# Patient Record
Sex: Female | Born: 1950 | Race: White | Hispanic: No | Marital: Married | Smoking: Never smoker
Health system: Southern US, Community
[De-identification: ages and names within clinical notes are randomized; demographics above are authoritative.]

## PROBLEM LIST (undated history)

## (undated) DIAGNOSIS — I82409 Acute embolism and thrombosis of unspecified deep veins of unspecified lower extremity: Secondary | ICD-10-CM

## (undated) DIAGNOSIS — J189 Pneumonia, unspecified organism: Secondary | ICD-10-CM

## (undated) DIAGNOSIS — Z9889 Other specified postprocedural states: Secondary | ICD-10-CM

## (undated) DIAGNOSIS — N200 Calculus of kidney: Secondary | ICD-10-CM

## (undated) DIAGNOSIS — K219 Gastro-esophageal reflux disease without esophagitis: Secondary | ICD-10-CM

## (undated) DIAGNOSIS — R112 Nausea with vomiting, unspecified: Secondary | ICD-10-CM

## (undated) DIAGNOSIS — Z87442 Personal history of urinary calculi: Secondary | ICD-10-CM

## (undated) DIAGNOSIS — R519 Headache, unspecified: Secondary | ICD-10-CM

## (undated) DIAGNOSIS — L900 Lichen sclerosus et atrophicus: Secondary | ICD-10-CM

## (undated) DIAGNOSIS — R7303 Prediabetes: Secondary | ICD-10-CM

## (undated) DIAGNOSIS — M1990 Unspecified osteoarthritis, unspecified site: Secondary | ICD-10-CM

## (undated) DIAGNOSIS — G2581 Restless legs syndrome: Secondary | ICD-10-CM

## (undated) HISTORY — PX: RETINAL TEAR REPAIR CRYOTHERAPY: SHX5304

## (undated) HISTORY — PX: OTHER SURGICAL HISTORY: SHX169

---
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
## 1982-01-16 HISTORY — PX: TONSILLECTOMY: SUR1361

## 1983-01-17 HISTORY — PX: TUBAL LIGATION: SHX77

## 1994-01-16 HISTORY — PX: CHOLECYSTECTOMY: SHX55

## 2004-01-17 HISTORY — PX: EXTRACORPOREAL SHOCK WAVE LITHOTRIPSY: SHX1557

## 2008-01-17 HISTORY — PX: CYST EXCISION: SHX5701

## 2011-01-17 HISTORY — PX: CATARACT EXTRACTION: SUR2

## 2011-01-17 HISTORY — PX: EYE SURGERY: SHX253

## 2012-07-03 DIAGNOSIS — M1610 Unilateral primary osteoarthritis, unspecified hip: Secondary | ICD-10-CM | POA: Insufficient documentation

## 2013-01-16 DIAGNOSIS — I2699 Other pulmonary embolism without acute cor pulmonale: Secondary | ICD-10-CM

## 2013-01-16 DIAGNOSIS — J189 Pneumonia, unspecified organism: Secondary | ICD-10-CM

## 2013-01-16 DIAGNOSIS — I82409 Acute embolism and thrombosis of unspecified deep veins of unspecified lower extremity: Secondary | ICD-10-CM

## 2013-01-16 HISTORY — DX: Other pulmonary embolism without acute cor pulmonale: I26.99

## 2013-01-16 HISTORY — DX: Pneumonia, unspecified organism: J18.9

## 2013-01-16 HISTORY — PX: HYSTEROSCOPY: SHX211

## 2013-01-16 HISTORY — PX: COLONOSCOPY: SHX174

## 2013-01-16 HISTORY — DX: Acute embolism and thrombosis of unspecified deep veins of unspecified lower extremity: I82.409

## 2013-08-15 DIAGNOSIS — I2699 Other pulmonary embolism without acute cor pulmonale: Secondary | ICD-10-CM | POA: Insufficient documentation

## 2013-08-15 DIAGNOSIS — R0781 Pleurodynia: Secondary | ICD-10-CM | POA: Insufficient documentation

## 2013-08-16 DIAGNOSIS — R0902 Hypoxemia: Secondary | ICD-10-CM | POA: Insufficient documentation

## 2013-08-17 DIAGNOSIS — I82409 Acute embolism and thrombosis of unspecified deep veins of unspecified lower extremity: Secondary | ICD-10-CM | POA: Insufficient documentation

## 2017-07-09 DIAGNOSIS — E669 Obesity, unspecified: Secondary | ICD-10-CM | POA: Insufficient documentation

## 2017-07-09 DIAGNOSIS — Z86711 Personal history of pulmonary embolism: Secondary | ICD-10-CM | POA: Insufficient documentation

## 2017-07-09 DIAGNOSIS — L900 Lichen sclerosus et atrophicus: Secondary | ICD-10-CM | POA: Insufficient documentation

## 2017-07-09 DIAGNOSIS — R519 Headache, unspecified: Secondary | ICD-10-CM | POA: Insufficient documentation

## 2017-08-08 ENCOUNTER — Ambulatory Visit: Payer: Self-pay | Admitting: Family Medicine

## 2017-08-26 DIAGNOSIS — R519 Headache, unspecified: Secondary | ICD-10-CM | POA: Insufficient documentation

## 2017-11-09 DIAGNOSIS — Z0000 Encounter for general adult medical examination without abnormal findings: Secondary | ICD-10-CM | POA: Insufficient documentation

## 2018-03-19 ENCOUNTER — Other Ambulatory Visit: Payer: Self-pay | Admitting: Obstetrics and Gynecology

## 2018-03-19 DIAGNOSIS — Z1231 Encounter for screening mammogram for malignant neoplasm of breast: Secondary | ICD-10-CM

## 2018-04-01 ENCOUNTER — Ambulatory Visit
Admission: RE | Admit: 2018-04-01 | Discharge: 2018-04-01 | Disposition: A | Discharge status: Home / Self Care | Payer: Medicare Other | Source: Ambulatory Visit | Attending: Obstetrics and Gynecology | Admitting: Obstetrics and Gynecology

## 2018-04-01 ENCOUNTER — Other Ambulatory Visit: Payer: Self-pay

## 2018-04-01 DIAGNOSIS — Z1231 Encounter for screening mammogram for malignant neoplasm of breast: Secondary | ICD-10-CM

## 2018-05-08 ENCOUNTER — Ambulatory Visit: Payer: Self-pay | Admitting: Urology

## 2018-06-19 ENCOUNTER — Ambulatory Visit: Payer: Self-pay | Admitting: Urology

## 2018-07-02 ENCOUNTER — Other Ambulatory Visit: Payer: Self-pay

## 2018-07-02 ENCOUNTER — Encounter: Payer: Self-pay | Admitting: Urology

## 2018-07-02 ENCOUNTER — Ambulatory Visit
Admission: RE | Admit: 2018-07-02 | Discharge: 2018-07-02 | Disposition: A | Discharge status: Home / Self Care | Payer: Medicare Other | Source: Ambulatory Visit | Attending: Urology | Admitting: Urology

## 2018-07-02 ENCOUNTER — Ambulatory Visit (INDEPENDENT_AMBULATORY_CARE_PROVIDER_SITE_OTHER): Payer: Medicare Other | Admitting: Urology

## 2018-07-02 VITALS — BP 114/77 | HR 82 | Ht 65.0 in | Wt 229.5 lb

## 2018-07-02 DIAGNOSIS — N9089 Other specified noninflammatory disorders of vulva and perineum: Secondary | ICD-10-CM | POA: Insufficient documentation

## 2018-07-02 DIAGNOSIS — N200 Calculus of kidney: Secondary | ICD-10-CM | POA: Insufficient documentation

## 2018-07-02 DIAGNOSIS — G2581 Restless legs syndrome: Secondary | ICD-10-CM | POA: Insufficient documentation

## 2018-07-02 DIAGNOSIS — N950 Postmenopausal bleeding: Secondary | ICD-10-CM | POA: Insufficient documentation

## 2018-07-02 DIAGNOSIS — H269 Unspecified cataract: Secondary | ICD-10-CM | POA: Insufficient documentation

## 2018-07-02 NOTE — Progress Notes (Addendum)
07/02/2018 2:39 PM   Savannah Wilkins September 16, 1950 [id number]  Referring provider: Boykin Nearing, MD [address] [facility] [place],  [place] [address]  Chief Complaint  Patient presents with  . Establish Care  . Nephrolithiasis    HPI: Savannah Wilkins is a 68 year-old female with a history of recurrent stone disease who presents to establish urologic care.  She has previously been followed by Dr. Dyann Ruddle at [facility] and last saw him 04/04/2017.  She has had multiple prior stone procedures including right ureteroscopic stone removals in 2006, 2013; a left ureteroscopic stone removal 2006 and shockwave lithotripsy x3 and 2006 twice on the left and once on the right.  She states her last imaging was 18 months ago and had left renal calculi.  She passed a small stone in April 2019 and has been asymptomatic since that time.  She denies flank, abdominal or pelvic pain.  She has no voiding symptoms and denies gross hematuria.  PMH: Past Medical History:  Diagnosis Date  . Kidney stone     Surgical History: . CATARACT EXTRACTION  . CHOLECYSTECTOMY  . COMBINED HYSTEROSCOPY DIAGNOSTIC / D&C 02/10/2013  . DCR of right eye  . Left eye retinal tear repair  . Multiple kidney stone surgeries  . Right eye retinal tear repair  . Right pinkie finger cyst excision 01/23/2008  . TONSILLECTOMY 1984  . TUBAL LIGATION 1985   Home Medications:  Allergies as of 07/02/2018      Reactions   Ciprofloxacin    Quinolones    Other reaction(s): Other (See Comments), Other (See Comments), Other (See Comments) Caused pain & cramping Caused severe tendonitis in feet Caused severe tendonitis in feet   Penicillins Rash   Caused generalized rash   Sulfa Antibiotics Rash   Caused generalized rash      Medication List       Accurate as of July 02, 2018  2:39 PM. If you have any questions, ask your nurse or doctor.        amitriptyline 25 MG tablet Commonly known as:  ELAVIL amitriptyline 25 mg tablet  TK 1 T PO HS   clobetasol ointment 0.05 % Commonly known as: TEMOVATE clobetasol 0.05 % topical ointment  APP THIN LAYER EXT AA 2 TIMES A WK HS   Emgality 120 MG/ML Soaj Generic drug: Galcanezumab-gnlm   Fluocinolone Acetonide 0.01 % Oil INT 4 GTS IN AU QHS PRN FOR DRYNESS AND ITCHING   ketorolac 10 MG tablet Commonly known as: TORADOL ketorolac 10 mg tablet   meloxicam 15 MG tablet Commonly known as: MOBIC meloxicam 15 mg tablet   neomycin-polymyxin-hydrocortisone OTIC solution Commonly known as: CORTISPORIN   ofloxacin 400 MG tablet Commonly known as: FLOXIN ofloxacin 400 mg tablet   Phendimetrazine Tartrate 105 MG Cp24 phendimetrazine tartrate ER 105 mg capsule,extended release   Prevnar 13 Susp injection Generic drug: pneumococcal 13-valent conjugate vaccine Prevnar 13 (PF) 0.5 mL intramuscular syringe  ADM 0.5ML IM UTD   ProAir HFA 108 (90 Base) MCG/ACT inhaler Generic drug: albuterol ProAir HFA 90 mcg/actuation aerosol inhaler   rOPINIRole 1 MG tablet Commonly known as: REQUIP Take by mouth.   tamsulosin 0.4 MG Caps capsule Commonly known as: FLOMAX tamsulosin 0.4 mg capsule   venlafaxine 37.5 MG tablet Commonly known as: EFFEXOR   Xarelto 20 MG Tabs tablet Generic drug: rivaroxaban Xarelto 20 mg tablet       Allergies:  Allergies  Allergen Reactions  . Ciprofloxacin   .  Quinolones     Other reaction(s): Other (See Comments), Other (See Comments), Other (See Comments) Caused pain & cramping Caused severe tendonitis in feet Caused severe tendonitis in feet   . Penicillins Rash    Caused generalized rash   . Sulfa Antibiotics Rash    Caused generalized rash     Family History: No family history on file.  Social History:  reports that she has never smoked. She has never used smokeless tobacco. She reports previous drug use. No history on file for alcohol.  ROS: UROLOGY Frequent Urination?: No  Hard to postpone urination?: No Burning/pain with urination?: No Get up at night to urinate?: No Leakage of urine?: No Urine stream starts and stops?: No Trouble starting stream?: No Do you have to strain to urinate?: No Blood in urine?: No Urinary tract infection?: No Sexually transmitted disease?: No Injury to kidneys or bladder?: No Painful intercourse?: No Weak stream?: No Currently pregnant?: No Vaginal bleeding?: No Last menstrual period?: Postmenopausal  Gastrointestinal Nausea?: No Vomiting?: No Indigestion/heartburn?: No Diarrhea?: No Constipation?: No  Constitutional Fever: No Night sweats?: No Weight loss?: No Fatigue?: No  Skin Skin rash/lesions?: No Itching?: No  Eyes Blurred vision?: No Double vision?: No  Ears/Nose/Throat Sore throat?: No Sinus problems?: No  Hematologic/Lymphatic Swollen glands?: No Easy bruising?: No  Cardiovascular Leg swelling?: No Chest pain?: No  Respiratory Cough?: No Shortness of breath?: No  Endocrine Excessive thirst?: No  Musculoskeletal Back pain?: No Joint pain?: No  Neurological Headaches?: Yes Dizziness?: No  Psychologic Depression?: No Anxiety?: No  Physical Exam: BP 114/77 (BP Location: Left Arm, Patient Position: Sitting, Cuff Size: Large)   Pulse 82   Ht 5\' 5"  (1.651 m)   Wt 229 lb 8 oz (104.1 kg)   BMI 38.19 kg/m   Constitutional:  Alert and oriented, No acute distress. HEENT: [place] AT, moist mucus membranes.  Trachea midline, no masses. Cardiovascular: No clubbing, cyanosis, or edema. RRR Respiratory: Normal respiratory effort, no increased work of breathing. Clear Neurologic: Grossly intact, no focal deficits, moving all 4 extremities. Psychiatric: Normal mood and affect.   Assessment & Plan:   68 year old female with recurrent stone disease and left nephrolithiasis by history.  A KUB was ordered and she will be notified with results.  As long as she remains asymptomatic she will  follow-up annually but was instructed to call as needed for development of flank pain/renal colic.   Return in about 1 year (around 07/02/2019) for [place], KUB.   Addendum:  KUB obtained on 6/16 showed a 12 mm calcification overlying the expected course of the right proximal ureter.  Stone protocol CT performed on 07/17/2018 was remarkable for a 7 mm calcification in the right proximal ureter with no significant obstruction.  She was also noted to have bilateral, small renal calculi.  She was contacted by telephone regarding her CT results.  Management options were discussed including trial of passage, shockwave lithotripsy and ureteroscopy.  She has elected to proceed with shockwave lithotripsy.  Stone density ~ 880 HU.  The procedure was discussed in detail on 07/18/2018.  Potential risks were discussed as outlined.  [facility] consent form.  The possible need for stent placement or repeat lithotripsy/additional procedures was discussed.  She is on Xarelto which will need to be discontinued per [employer].  All questions were answered and she desires to proceed.   Abbie Sons, [place] [address] [place], [place] [address] [phone]  

## 2018-07-02 NOTE — Addendum Note (Signed)
Addended by: Donalee Citrin on: 07/02/2018 03:45 PM   Modules accepted: Orders

## 2018-07-03 ENCOUNTER — Telehealth: Payer: Self-pay | Admitting: Urology

## 2018-07-03 DIAGNOSIS — N200 Calculus of kidney: Secondary | ICD-10-CM

## 2018-07-03 DIAGNOSIS — N201 Calculus of ureter: Secondary | ICD-10-CM

## 2018-07-03 LAB — MICROSCOPIC EXAMINATION
Bacteria, UA: NONE SEEN
RBC, Urine: NONE SEEN /hpf (ref 0–2)

## 2018-07-03 LAB — URINALYSIS, COMPLETE
Bilirubin, UA: NEGATIVE
Glucose, UA: NEGATIVE
Leukocytes,UA: NEGATIVE
Nitrite, UA: NEGATIVE
Protein,UA: NEGATIVE
Specific Gravity, UA: 1.025 (ref 1.005–1.030)
Urobilinogen, Ur: 0.2 mg/dL (ref 0.2–1.0)
pH, UA: 5.5 (ref 5.0–7.5)

## 2018-07-03 NOTE — Telephone Encounter (Signed)
She has a fairly large calcification which could be a stone in the right upper ureter.  There is a large amount of stool and bowel gas obscuring the renal outlines.  There may be a calculus in the upper part of the left kidney.  For further evaluation I would recommend scheduling a stone protocol CT of the abdomen and pelvis.  Order was entered and will call with results.

## 2018-07-03 NOTE — Telephone Encounter (Signed)
Patient called the office this afternoon requesting results from her KUB.  Please call her at [phone].

## 2018-07-04 NOTE — Telephone Encounter (Signed)
Patient notified

## 2018-07-17 ENCOUNTER — Other Ambulatory Visit: Payer: Self-pay

## 2018-07-17 ENCOUNTER — Ambulatory Visit
Admission: RE | Admit: 2018-07-17 | Discharge: 2018-07-17 | Disposition: A | Discharge status: Home / Self Care | Payer: Medicare Other | Source: Ambulatory Visit | Attending: Urology | Admitting: Urology

## 2018-07-17 DIAGNOSIS — N200 Calculus of kidney: Secondary | ICD-10-CM

## 2018-07-17 DIAGNOSIS — N201 Calculus of ureter: Secondary | ICD-10-CM | POA: Insufficient documentation

## 2018-07-18 ENCOUNTER — Telehealth: Payer: Self-pay | Admitting: Urology

## 2018-07-18 NOTE — Telephone Encounter (Signed)
Patient was notified of CT results.  She has a 7 mm right proximal ureteral calculus.  Treatment options were discussed including shockwave lithotripsy and ureteroscopy.  She has elected shockwave lithotripsy.  We discussed the pros and cons of each treatment and the success rate of lithotripsy at >80%.  All questions were answered and she desires to schedule.

## 2018-07-22 ENCOUNTER — Other Ambulatory Visit: Payer: Self-pay

## 2018-07-22 ENCOUNTER — Other Ambulatory Visit: Payer: Medicare Other

## 2018-07-22 ENCOUNTER — Encounter: Payer: Self-pay | Admitting: *Deleted

## 2018-07-22 ENCOUNTER — Other Ambulatory Visit: Payer: Self-pay | Admitting: Radiology

## 2018-07-22 DIAGNOSIS — N200 Calculus of kidney: Secondary | ICD-10-CM

## 2018-07-22 DIAGNOSIS — N201 Calculus of ureter: Secondary | ICD-10-CM

## 2018-07-22 LAB — URINALYSIS, COMPLETE
Bilirubin, UA: NEGATIVE
Glucose, UA: NEGATIVE
Nitrite, UA: NEGATIVE
Specific Gravity, UA: 1.025 (ref 1.005–1.030)
Urobilinogen, Ur: 1 mg/dL (ref 0.2–1.0)
pH, UA: 5.5 (ref 5.0–7.5)

## 2018-07-22 LAB — MICROSCOPIC EXAMINATION

## 2018-07-24 LAB — CULTURE, URINE COMPREHENSIVE

## 2018-07-24 MED ORDER — SODIUM CHLORIDE 0.9 % IV SOLN
1.0000 g | Freq: Once | INTRAVENOUS | Status: AC
  Administered 2018-07-25: 1 g via INTRAVENOUS
  Filled 2018-07-24: qty 10

## 2018-07-25 ENCOUNTER — Telehealth: Payer: Self-pay | Admitting: Urology

## 2018-07-25 ENCOUNTER — Encounter
Admission: RE | Disposition: A | Discharge status: Home / Self Care | Payer: Self-pay | Source: Home / Self Care | Attending: Urology

## 2018-07-25 ENCOUNTER — Ambulatory Visit
Admission: RE | Admit: 2018-07-25 | Discharge: 2018-07-25 | Disposition: A | Discharge status: Home / Self Care | Payer: Medicare Other | Attending: Urology | Admitting: Urology

## 2018-07-25 ENCOUNTER — Ambulatory Visit: Payer: Medicare Other

## 2018-07-25 ENCOUNTER — Other Ambulatory Visit: Payer: Self-pay

## 2018-07-25 ENCOUNTER — Encounter: Payer: Self-pay | Admitting: *Deleted

## 2018-07-25 ENCOUNTER — Other Ambulatory Visit: Payer: Self-pay | Admitting: Urology

## 2018-07-25 DIAGNOSIS — Z7901 Long term (current) use of anticoagulants: Secondary | ICD-10-CM | POA: Diagnosis not present

## 2018-07-25 DIAGNOSIS — Z79899 Other long term (current) drug therapy: Secondary | ICD-10-CM | POA: Diagnosis not present

## 2018-07-25 DIAGNOSIS — Z87442 Personal history of urinary calculi: Secondary | ICD-10-CM | POA: Diagnosis not present

## 2018-07-25 DIAGNOSIS — Z791 Long term (current) use of non-steroidal anti-inflammatories (NSAID): Secondary | ICD-10-CM | POA: Insufficient documentation

## 2018-07-25 DIAGNOSIS — N201 Calculus of ureter: Secondary | ICD-10-CM | POA: Insufficient documentation

## 2018-07-25 HISTORY — DX: Unspecified osteoarthritis, unspecified site: M19.90

## 2018-07-25 HISTORY — DX: Personal history of urinary calculi: Z87.442

## 2018-07-25 HISTORY — DX: Lichen sclerosus et atrophicus: L90.0

## 2018-07-25 HISTORY — PX: EXTRACORPOREAL SHOCK WAVE LITHOTRIPSY: SHX1557

## 2018-07-25 HISTORY — DX: Headache, unspecified: R51.9

## 2018-07-25 SURGERY — LITHOTRIPSY, ESWL
Anesthesia: Moderate Sedation | Laterality: Right

## 2018-07-25 MED ORDER — DIAZEPAM 5 MG PO TABS
10.0000 mg | ORAL_TABLET | ORAL | Status: AC
  Administered 2018-07-25: 07:00:00 10 mg via ORAL

## 2018-07-25 MED ORDER — DIPHENHYDRAMINE HCL 25 MG PO CAPS
25.0000 mg | ORAL_CAPSULE | ORAL | Status: AC
  Administered 2018-07-25: 07:00:00 25 mg via ORAL

## 2018-07-25 MED ORDER — DIPHENHYDRAMINE HCL 25 MG PO CAPS
ORAL_CAPSULE | ORAL | Status: AC
  Administered 2018-07-25: 25 mg via ORAL
  Filled 2018-07-25: qty 1

## 2018-07-25 MED ORDER — ONDANSETRON HCL 4 MG/2ML IJ SOLN
4.0000 mg | Freq: Once | INTRAMUSCULAR | Status: AC | PRN
  Administered 2018-07-25: 07:00:00 4 mg via INTRAVENOUS

## 2018-07-25 MED ORDER — TAMSULOSIN HCL 0.4 MG PO CAPS: 0.4000 mg | ORAL_CAPSULE | Freq: Every day | ORAL | 0 refills | Status: DC

## 2018-07-25 MED ORDER — HYDROCODONE-ACETAMINOPHEN 5-325 MG PO TABS: 1.0000 | ORAL_TABLET | ORAL | 0 refills | Status: AC | PRN

## 2018-07-25 MED ORDER — SODIUM CHLORIDE 0.9 % IV SOLN
INTRAVENOUS | Status: DC
  Administered 2018-07-25: 07:00:00 via INTRAVENOUS

## 2018-07-25 MED ORDER — ONDANSETRON HCL 2 MG/ML IV SOLN: 4.0000 mg | Freq: Once | INTRAVENOUS | Status: DC | PRN

## 2018-07-25 MED ORDER — DIAZEPAM 5 MG PO TABS
ORAL_TABLET | ORAL | Status: AC
  Administered 2018-07-25: 07:00:00 10 mg via ORAL
  Filled 2018-07-25: qty 2

## 2018-07-25 MED ORDER — ONDANSETRON HCL 4 MG/2ML IJ SOLN
INTRAMUSCULAR | Status: AC
  Administered 2018-07-25: 4 mg via INTRAVENOUS
  Filled 2018-07-25: qty 2

## 2018-07-25 NOTE — Discharge Instructions (Signed)

## 2018-07-25 NOTE — H&P (Signed)
UROLOGY H&P UPDATE  Agree with prior H&P dated 07/02/18. 68 yo F with 48mm right mid ureteral stone.  Cardiac: RRR Lungs: CTA bilaterally  Laterality: RIGHT Procedure: SWL  Urine: culture 7/6 10k mixed urogenital flora  Informed consent obtained, we specifically discussed the risks of bleeding, infection, post-operative pain, need for additional procedures, steinstrasse.  Billey Co, MD 07/25/2018

## 2018-07-25 NOTE — Telephone Encounter (Signed)
RX sent in

## 2018-07-25 NOTE — Telephone Encounter (Signed)
Pt just got out of surgery and called and states that her Flomax has not been called in.

## 2018-07-26 ENCOUNTER — Encounter: Payer: Self-pay | Admitting: Urology

## 2018-07-26 ENCOUNTER — Telehealth: Payer: Self-pay | Admitting: Urology

## 2018-07-26 DIAGNOSIS — R112 Nausea with vomiting, unspecified: Secondary | ICD-10-CM

## 2018-07-26 DIAGNOSIS — Z9889 Other specified postprocedural states: Secondary | ICD-10-CM

## 2018-07-26 MED ORDER — ONDANSETRON HCL 4 MG PO TABS: 4.0000 mg | ORAL_TABLET | Freq: Three times a day (TID) | ORAL | 0 refills | Status: DC | PRN

## 2018-07-26 NOTE — Telephone Encounter (Signed)
Called pt informed her that her symptoms are normal for post lithotripsy. Advised pt to take pain meds as directed, pt states that she has tried to take her pain medication but has been unable to keep anything down. Advised pt that I would send in short term supply of zofran to help with n/v. Advised pt to [employer], as well as after hours nurse. Pt gave verbal understanding.

## 2018-07-26 NOTE — Telephone Encounter (Signed)
Pt called and state that she is having back pain, nausea today and a lot of pressure and also states that the urine is just trickling when she tries to urinate. Please advise.

## 2018-07-26 NOTE — Addendum Note (Signed)
Addended by: Donalee Citrin on: 07/26/2018 02:44 PM   Modules accepted: Orders

## 2018-08-14 ENCOUNTER — Telehealth: Payer: Self-pay | Admitting: Urology

## 2018-08-14 DIAGNOSIS — N200 Calculus of kidney: Secondary | ICD-10-CM

## 2018-08-14 NOTE — Telephone Encounter (Signed)
Patient has an app with [facility] tomorrow and needs an order for a KUB   Thanks  Sharyn Lull

## 2018-08-14 NOTE — Telephone Encounter (Signed)
Orders placed.

## 2018-08-15 ENCOUNTER — Ambulatory Visit (INDEPENDENT_AMBULATORY_CARE_PROVIDER_SITE_OTHER): Payer: Medicare Other | Admitting: Urology

## 2018-08-15 ENCOUNTER — Encounter: Payer: Self-pay | Admitting: Urology

## 2018-08-15 ENCOUNTER — Ambulatory Visit
Admission: RE | Admit: 2018-08-15 | Discharge: 2018-08-15 | Disposition: A | Discharge status: Home / Self Care | Payer: Medicare Other | Source: Ambulatory Visit | Attending: Urology | Admitting: Urology

## 2018-08-15 ENCOUNTER — Other Ambulatory Visit: Payer: Self-pay

## 2018-08-15 VITALS — BP 116/81 | HR 92 | Ht 65.0 in | Wt 227.0 lb

## 2018-08-15 DIAGNOSIS — N200 Calculus of kidney: Secondary | ICD-10-CM

## 2018-08-15 DIAGNOSIS — N2889 Other specified disorders of kidney and ureter: Secondary | ICD-10-CM | POA: Diagnosis not present

## 2018-08-15 NOTE — Progress Notes (Signed)
08/15/2018 12:52 PM   Savannah Wilkins [birth year] [id number]  Referring provider: Dion Body, MD [place] [facility] [place],  [place] [address]  Chief Complaint  Patient presents with  . Nephrolithiasis    HPI: 67 y.o. female presents for post lithotripsy follow-up.  She was seen last month to establish care for recurrent stone disease.  Although asymptomatic she was subsequently found to have a 7 mm right proximal ureteral calculus and underwent shockwave lithotripsy by Dr. Diamantina Providence on 07/25/2018.  She had no post procedure problems and brings in several small fragments today.  Prior stone analysis has been calcium oxalate.  She was incidentally noted to have a 71mm left lower pole renal mass for which renal mass protocol MRI was recommended by radiology.  She also has a 3-4 mm left upper pole renal calculus.  PMH: Past Medical History:  Diagnosis Date  . Arthritis   . DVT (deep venous thrombosis) ([place]) 2015   pulmonary emboli  . Headache   . History of kidney stones   . Kidney stone   . Lichen sclerosus   . Pneumonia 2015    Surgical History: Past Surgical History:  Procedure Laterality Date  . CHOLECYSTECTOMY  1996  . CYST EXCISION Right 2010   pinkie finger  . dcr Right    right eye  . EXTRACORPOREAL SHOCK WAVE LITHOTRIPSY Bilateral 2006   twice on left and once on right  . EXTRACORPOREAL SHOCK WAVE LITHOTRIPSY Right 07/25/2018   Procedure: EXTRACORPOREAL SHOCK WAVE LITHOTRIPSY (ESWL);  Surgeon: Billey Co, MD;  Location: [facility] ORS;  Service: Urology;  Laterality: Right;  . EYE SURGERY Bilateral 2013   cataract extractions  . HYSTEROSCOPY  2015  . kidney stone removal Right 2006, 2013   right ureteroscopic stone removals  . RETINAL TEAR REPAIR CRYOTHERAPY Bilateral   . TONSILLECTOMY  1984  . TUBAL LIGATION  1985    Home Medications:  Allergies as of 08/15/2018      Reactions   Ciprofloxacin    tendonitis   Penicillins Rash   Caused generalized rash   Quinolones    Other reaction(s): Other (See Comments), Other (See Comments), Other (See Comments) Caused pain & cramping Caused severe tendonitis in feet Caused severe tendonitis in feet   Sulfa Antibiotics Rash   Caused generalized rash      Medication List       Accurate as of August 15, 2018 12:52 PM. If you have any questions, ask your nurse or doctor.        amitriptyline 25 MG tablet Commonly known as: ELAVIL amitriptyline 25 mg tablet  TK 1 T PO HS   aspirin EC 81 MG tablet Take 81 mg by mouth daily.   clobetasol ointment 0.05 % Commonly known as: TEMOVATE clobetasol 0.05 % topical ointment  APP THIN LAYER EXT AA 2 TIMES A WK HS   Emgality 120 MG/ML Soaj Generic drug: Galcanezumab-gnlm   Fluocinolone Acetonide 0.01 % Oil INT 4 GTS IN AU QHS PRN FOR DRYNESS AND ITCHING   ketorolac 10 MG tablet Commonly known as: TORADOL ketorolac 10 mg tablet   meloxicam 15 MG tablet Commonly known as: MOBIC meloxicam 15 mg tablet   neomycin-polymyxin-hydrocortisone OTIC solution Commonly known as: CORTISPORIN   ofloxacin 400 MG tablet Commonly known as: FLOXIN ofloxacin 400 mg tablet   ondansetron 4 MG tablet Commonly known as: Zofran Take 1 tablet (4 mg total) by mouth every 8 (eight) hours as needed for nausea or  vomiting.   Phendimetrazine Tartrate 105 MG Cp24 phendimetrazine tartrate ER 105 mg capsule,extended release   PRESERVISION AREDS 2 PO Take by mouth 2 (two) times daily.   Prevnar 13 Susp injection Generic drug: pneumococcal 13-valent conjugate vaccine Prevnar 13 (PF) 0.5 mL intramuscular syringe  ADM 0.5ML IM UTD   ProAir HFA 108 (90 Base) MCG/ACT inhaler Generic drug: albuterol ProAir HFA 90 mcg/actuation aerosol inhaler   rOPINIRole 1 MG tablet Commonly known as: REQUIP Take by mouth.   tamsulosin 0.4 MG Caps capsule Commonly known as: FLOMAX tamsulosin 0.4 mg capsule   tamsulosin 0.4 MG Caps capsule Commonly  known as: FLOMAX Take 1 capsule (0.4 mg total) by mouth daily.   venlafaxine 37.5 MG tablet Commonly known as: EFFEXOR   Xarelto 20 MG Tabs tablet Generic drug: rivaroxaban Xarelto 20 mg tablet       Allergies:  Allergies  Allergen Reactions  . Ciprofloxacin     tendonitis  . Penicillins Rash    Caused generalized rash   . Quinolones     Other reaction(s): Other (See Comments), Other (See Comments), Other (See Comments) Caused pain & cramping Caused severe tendonitis in feet Caused severe tendonitis in feet   . Sulfa Antibiotics Rash    Caused generalized rash     Family History: No family history on file.  Social History:  reports that she has never smoked. She has never used smokeless tobacco. She reports previous alcohol use. She reports previous drug use.  ROS: UROLOGY Frequent Urination?: No Hard to postpone urination?: No Burning/pain with urination?: No Get up at night to urinate?: No Leakage of urine?: No Urine stream starts and stops?: No Trouble starting stream?: No Do you have to strain to urinate?: No Blood in urine?: No Urinary tract infection?: No Sexually transmitted disease?: No Injury to kidneys or bladder?: No Painful intercourse?: No Weak stream?: No Currently pregnant?: No Vaginal bleeding?: No Last menstrual period?: n  Gastrointestinal Nausea?: No Indigestion/heartburn?: No Diarrhea?: No Constipation?: No  Constitutional Fever: No Night sweats?: No Weight loss?: No Fatigue?: No  Skin Skin rash/lesions?: No Itching?: No  Eyes Blurred vision?: No Double vision?: No  Ears/Nose/Throat Sore throat?: No Sinus problems?: No  Hematologic/Lymphatic Swollen glands?: No Easy bruising?: No  Cardiovascular Leg swelling?: No Chest pain?: No  Respiratory Cough?: No Shortness of breath?: No  Endocrine Excessive thirst?: No  Musculoskeletal Back pain?: No Joint pain?: No  Neurological Headaches?: Yes  Dizziness?: No  Psychologic Depression?: No Anxiety?: No  Physical Exam: BP 116/81 (BP Location: Left Arm, Patient Position: Sitting)   Pulse 92   Ht 5\' 5"  (1.651 m)   Wt 227 lb (103 kg)   BMI 37.77 kg/m   Constitutional:  Alert and oriented, No acute distress.   Assessment & Plan:    -Status post shockwave lithotripsy Doing well with complete stone fragmentation.  No residual fragments seen on KUB.  Her renal MRI will assess for silent obstruction.  - Nephrolithiasis  3 mm left renal calculus.  Follow-up KUB 6 months  - Renal mass 69mm left renal mass seen on noncontrast CT.  Schedule renal mass protocol MRI for further evaluation.  This is a new problem unrelated to her stone treatment  Abbie Sons, MD  [place] [address] [place], [place] [address] [phone]

## 2018-08-16 ENCOUNTER — Ambulatory Visit: Payer: Medicare Other | Admitting: Urology

## 2018-08-25 ENCOUNTER — Ambulatory Visit
Admission: RE | Admit: 2018-08-25 | Discharge: 2018-08-25 | Disposition: A | Discharge status: Home / Self Care | Payer: Medicare Other | Source: Ambulatory Visit | Attending: Urology | Admitting: Urology

## 2018-08-25 DIAGNOSIS — N2889 Other specified disorders of kidney and ureter: Secondary | ICD-10-CM

## 2018-08-25 LAB — POCT I-STAT CREATININE: Creatinine, Ser: 0.6 mg/dL (ref 0.44–1.00)

## 2018-08-25 MED ORDER — GADOBUTROL 1 MMOL/ML IV SOLN
10.0000 mL | Freq: Once | INTRAVENOUS | Status: AC | PRN
  Administered 2018-08-25: 10 mL via INTRAVENOUS

## 2018-08-27 ENCOUNTER — Telehealth: Payer: Self-pay | Admitting: Urology

## 2018-08-27 NOTE — Telephone Encounter (Signed)
Pt was told we would call with MRI results.  She hasn't heard anything yet and is very anxious.

## 2018-08-27 NOTE — Telephone Encounter (Signed)
Ok to give results?

## 2018-08-29 ENCOUNTER — Encounter: Payer: Self-pay | Admitting: Urology

## 2018-08-29 NOTE — Telephone Encounter (Signed)
MRI showed a simple cyst.  I sent patient a my chart message

## 2018-08-29 NOTE — Telephone Encounter (Signed)
Pt has called back and is wanting results from her MRI

## 2019-02-11 ENCOUNTER — Other Ambulatory Visit: Payer: Self-pay | Admitting: *Deleted

## 2019-02-11 DIAGNOSIS — N200 Calculus of kidney: Secondary | ICD-10-CM

## 2019-02-20 ENCOUNTER — Ambulatory Visit
Admission: RE | Admit: 2019-02-20 | Discharge: 2019-02-20 | Disposition: A | Discharge status: Home / Self Care | Payer: Medicare PPO | Source: Ambulatory Visit | Attending: Urology | Admitting: Urology

## 2019-02-20 ENCOUNTER — Other Ambulatory Visit: Payer: Self-pay

## 2019-02-20 ENCOUNTER — Encounter: Payer: Self-pay | Admitting: Urology

## 2019-02-20 ENCOUNTER — Ambulatory Visit (INDEPENDENT_AMBULATORY_CARE_PROVIDER_SITE_OTHER): Payer: Medicare PPO | Admitting: Urology

## 2019-02-20 VITALS — BP 142/70 | HR 84 | Ht 65.0 in | Wt 235.0 lb

## 2019-02-20 DIAGNOSIS — N200 Calculus of kidney: Secondary | ICD-10-CM | POA: Insufficient documentation

## 2019-02-20 NOTE — Progress Notes (Signed)
02/20/2019 10:55 AM   Savannah Wilkins [birth year] [id number]  Referring provider: Dion Body, MD [place] [facility] [place],  [place] [address]  Chief Complaint  Patient presents with  . Nephrolithiasis    Urologic history: 1.  Recurrent stone disease -Asymptomatic left renal calculus -Shockwave lithotripsy 7 mm right proximal ureteral calculus 7/20  HPI: 69 yo female presents for semiannual follow-up.  Her stone protocol CT did show a 16 mm indeterminate left renal mass.  She had a renal mass protocol MRI and this was a simple cyst.  She remains asymptomatic since her last visit and specifically denies flank, abdominal or pelvic pain.  Denies bothersome lower urinary tract symptoms.  KUB performed today was reviewed and there has been some interval growth in the left renal calculus measured today at 4.7 mm.  There is also a 2 mm spherical calcification infero-medial to the this calculus.   PMH: Past Medical History:  Diagnosis Date  . Arthritis   . DVT (deep venous thrombosis) ([place]) 2015   pulmonary emboli  . Headache   . History of kidney stones   . Kidney stone   . Lichen sclerosus   . Pneumonia 2015    Surgical History: Past Surgical History:  Procedure Laterality Date  . CHOLECYSTECTOMY  1996  . CYST EXCISION Right 2010   pinkie finger  . dcr Right    right eye  . EXTRACORPOREAL SHOCK WAVE LITHOTRIPSY Bilateral 2006   twice on left and once on right  . EXTRACORPOREAL SHOCK WAVE LITHOTRIPSY Right 07/25/2018   Procedure: EXTRACORPOREAL SHOCK WAVE LITHOTRIPSY (ESWL);  Surgeon: Billey Co, MD;  Location: [facility] ORS;  Service: Urology;  Laterality: Right;  . EYE SURGERY Bilateral 2013   cataract extractions  . HYSTEROSCOPY  2015  . kidney stone removal Right 2006, 2013   right ureteroscopic stone removals  . RETINAL TEAR REPAIR CRYOTHERAPY Bilateral   . TONSILLECTOMY  1984  . TUBAL LIGATION  1985    Home Medications:    Allergies as of 02/20/2019      Reactions   Ciprofloxacin    tendonitis   Penicillins Rash   Caused generalized rash   Quinolones    Other reaction(s): Other (See Comments), Other (See Comments), Other (See Comments) Caused pain & cramping Caused severe tendonitis in feet Caused severe tendonitis in feet   Sulfa Antibiotics Rash   Caused generalized rash      Medication List       Accurate as of February 20, 2019 10:55 AM. If you have any questions, ask your nurse or doctor.        STOP taking these medications   amitriptyline 25 MG tablet Commonly known as: ELAVIL Stopped by: Abbie Sons, MD   ketorolac 10 MG tablet Commonly known as: TORADOL Stopped by: Abbie Sons, MD   meloxicam 15 MG tablet Commonly known as: MOBIC Stopped by: Abbie Sons, MD   neomycin-polymyxin-hydrocortisone OTIC solution Commonly known as: CORTISPORIN Stopped by: Abbie Sons, MD   ofloxacin 400 MG tablet Commonly known as: FLOXIN Stopped by: Abbie Sons, MD   ondansetron 4 MG tablet Commonly known as: Zofran Stopped by: Abbie Sons, MD   Phendimetrazine Tartrate 105 MG Cp24 Stopped by: Abbie Sons, MD   ProAir HFA 108 (90 Base) MCG/ACT inhaler Generic drug: albuterol Stopped by: Abbie Sons, MD   rOPINIRole 1 MG tablet Commonly known as: REQUIP Stopped by: Abbie Sons, MD  venlafaxine 37.5 MG tablet Commonly known as: EFFEXOR Stopped by: Abbie Sons, MD   Xarelto 20 MG Tabs tablet Generic drug: rivaroxaban Stopped by: Abbie Sons, MD     TAKE these medications   aspirin EC 81 MG tablet Take 81 mg by mouth daily.   clobetasol ointment 0.05 % Commonly known as: TEMOVATE clobetasol 0.05 % topical ointment  APP THIN LAYER EXT AA 2 TIMES A WK HS   Emgality 120 MG/ML Soaj Generic drug: Galcanezumab-gnlm   Fluocinolone Acetonide 0.01 % Oil INT 4 GTS IN AU QHS PRN FOR DRYNESS AND ITCHING   PRESERVISION AREDS 2 PO Take by  mouth 2 (two) times daily.   Prevnar 13 Susp injection Generic drug: pneumococcal 13-valent conjugate vaccine Prevnar 13 (PF) 0.5 mL intramuscular syringe  ADM 0.5ML IM UTD   tamsulosin 0.4 MG Caps capsule Commonly known as: FLOMAX tamsulosin 0.4 mg capsule What changed: Another medication with the same name was removed. Continue taking this medication, and follow the directions you see here. Changed by: Abbie Sons, MD       Allergies:  Allergies  Allergen Reactions  . Ciprofloxacin     tendonitis  . Penicillins Rash    Caused generalized rash   . Quinolones     Other reaction(s): Other (See Comments), Other (See Comments), Other (See Comments) Caused pain & cramping Caused severe tendonitis in feet Caused severe tendonitis in feet   . Sulfa Antibiotics Rash    Caused generalized rash     Family History: No family history on file.  Social History:  reports that she has never smoked. She has never used smokeless tobacco. She reports previous alcohol use. She reports previous drug use.  ROS: UROLOGY Frequent Urination?: No Hard to postpone urination?: No Burning/pain with urination?: No Get up at night to urinate?: No Leakage of urine?: No Urine stream starts and stops?: No Trouble starting stream?: No Do you have to strain to urinate?: No Blood in urine?: No Urinary tract infection?: No Sexually transmitted disease?: No Injury to kidneys or bladder?: No Painful intercourse?: No Weak stream?: No Currently pregnant?: No Vaginal bleeding?: No Last menstrual period?: n  Gastrointestinal Nausea?: No Vomiting?: No Indigestion/heartburn?: No Diarrhea?: No Constipation?: No  Constitutional Fever: No Night sweats?: No Weight loss?: No Fatigue?: No  Skin Skin rash/lesions?: No Itching?: No  Eyes Blurred vision?: No Double vision?: No  Ears/Nose/Throat Sore throat?: No Sinus problems?: No  Hematologic/Lymphatic Swollen glands?: No Easy  bruising?: No  Cardiovascular Leg swelling?: No Chest pain?: No  Respiratory Cough?: No Shortness of breath?: No  Endocrine Excessive thirst?: No  Musculoskeletal Back pain?: No Joint pain?: No  Neurological Headaches?: No Dizziness?: No  Psychologic Depression?: No Anxiety?: No  Physical Exam: BP (!) 142/70   Pulse 84   Ht 5\' 5"  (1.651 m)   Wt 235 lb (106.6 kg)   BMI 39.11 kg/m   Constitutional:  Alert and oriented, No acute distress. HEENT: [place] AT, moist mucus membranes.  Trachea midline, no masses. Cardiovascular: No clubbing, cyanosis, or edema. Respiratory: Normal respiratory effort, no increased work of breathing. Skin: No rashes, bruises or suspicious lesions. Neurologic: Grossly intact, no focal deficits, moving all 4 extremities. Psychiatric: Normal mood and affect.   Assessment & Plan:    - Left nephrolithiasis Slight interval growth in the last 6 months.  Options of elective shockwave lithotripsy and continued observation were discussed.  She has elected the latter and will schedule a 69-month follow-up KUB  with a telephone visit.  The second calcification is spherical and may not be a second stone.  Her CT did show punctate left renal calculi.   Abbie Sons, [place] [address] [place] [employer] [place], [place] [address] [phone]

## 2019-07-03 ENCOUNTER — Ambulatory Visit: Payer: Medicare Other | Admitting: Urology

## 2019-08-12 ENCOUNTER — Other Ambulatory Visit: Payer: Self-pay | Admitting: *Deleted

## 2019-08-12 DIAGNOSIS — N200 Calculus of kidney: Secondary | ICD-10-CM

## 2019-08-15 ENCOUNTER — Ambulatory Visit
Admission: RE | Admit: 2019-08-15 | Discharge: 2019-08-15 | Disposition: A | Discharge status: Home / Self Care | Payer: Medicare PPO | Source: Ambulatory Visit | Attending: Urology | Admitting: Urology

## 2019-08-15 ENCOUNTER — Ambulatory Visit
Admission: RE | Admit: 2019-08-15 | Discharge: 2019-08-15 | Disposition: A | Discharge status: Home / Self Care | Payer: Medicare PPO | Attending: Urology | Admitting: Urology

## 2019-08-15 DIAGNOSIS — N200 Calculus of kidney: Secondary | ICD-10-CM

## 2019-08-20 ENCOUNTER — Other Ambulatory Visit: Payer: Self-pay

## 2019-08-20 ENCOUNTER — Telehealth (INDEPENDENT_AMBULATORY_CARE_PROVIDER_SITE_OTHER): Payer: Medicare PPO | Admitting: Urology

## 2019-08-20 DIAGNOSIS — N200 Calculus of kidney: Secondary | ICD-10-CM

## 2019-08-21 ENCOUNTER — Encounter: Payer: Self-pay | Admitting: Urology

## 2019-08-21 NOTE — Progress Notes (Signed)
Virtual Visit via Telephone Note  I connected with [name] on 08/21/19 at 11:15 AM EDT by telephone and verified that I am speaking with the correct person using two identifiers.  Location: Patient: Savannah Wilkins Provider: Home   I discussed the limitations, risks, security and privacy concerns of performing an evaluation and management service by telephone and the availability of in person appointments. I also discussed with the patient that there may be a patient responsible charge related to this service. The patient expressed understanding and agreed to proceed.   History of Present Illness: 69 y.o. female for annual follow-up.  She underwent shockwave lithotripsy in July 2020 for an 11 mm left proximal ureteral calculus which on follow-up imaging had resolved.  CT showed a nonobstructing left upper pole renal calculus measuring 4 mm.  She also had a indeterminate 17mm left renal mass and follow-up MRI showed this to be a simple cyst.  Since her visit last year she has done well.  Denies flank or abdominal pain.  No voiding symptoms or gross hematuria.  KUB performed on 08/15/2019 was reviewed and the 72mm left upper pole renal calcification is stable.  There are faint calcifications overlying the lower portion of left renal outline however there is also overlie the left 12th rib.  There is a large 10 x 15 mm calcification lateral to the L4 transverse process.   Observations/Objective: N/A  Assessment and Plan:  1.  Nephrolithiasis Stable 4 mm left renal calcification.  There is a large new calcification adjacent to the L4 transverse process which could represent a left proximal ureteral calculus however she is asymptomatic and less likely this would form in over a year.  Follow Up Instructions: We will repeat a KUB in 1-2 weeks to make sure this is not something within the bowel and if still present on follow-up KUB will repeat stone protocol CT.   I discussed  the assessment and treatment plan with the patient. The patient was provided an opportunity to ask questions and all were answered. The patient agreed with the plan and demonstrated an understanding of the instructions.   The patient was advised to call back or seek an in-person evaluation if the symptoms worsen or if the condition fails to improve as anticipated.  I provided 15 minutes of non-face-to-face time during this encounter.   Abbie Sons, MD

## 2019-09-04 ENCOUNTER — Other Ambulatory Visit: Payer: Self-pay

## 2019-09-04 ENCOUNTER — Ambulatory Visit
Admission: RE | Admit: 2019-09-04 | Discharge: 2019-09-04 | Disposition: A | Discharge status: Home / Self Care | Payer: Medicare PPO | Source: Ambulatory Visit | Attending: Urology | Admitting: Urology

## 2019-09-04 ENCOUNTER — Ambulatory Visit
Admission: RE | Admit: 2019-09-04 | Discharge: 2019-09-04 | Disposition: A | Discharge status: Home / Self Care | Payer: Medicare PPO | Attending: Urology | Admitting: Urology

## 2019-09-04 DIAGNOSIS — N200 Calculus of kidney: Secondary | ICD-10-CM | POA: Diagnosis present

## 2019-09-08 ENCOUNTER — Telehealth: Payer: Self-pay | Admitting: *Deleted

## 2019-09-08 NOTE — Telephone Encounter (Signed)
Notified patient as instructed, patient pleased. Discussed follow-up appointments, patient agrees  

## 2019-09-08 NOTE — Telephone Encounter (Signed)
-----   Message from Abbie Sons, MD sent at 09/07/2019  9:30 PM EDT ----- KUB reviewed and the previously noted large calcifications to the right of the lumbar spine is no longer seen it was therefore something in the bowel

## 2019-12-15 ENCOUNTER — Other Ambulatory Visit: Payer: Self-pay | Admitting: Orthopedic Surgery

## 2019-12-25 ENCOUNTER — Ambulatory Visit (INDEPENDENT_AMBULATORY_CARE_PROVIDER_SITE_OTHER): Payer: Medicare PPO | Admitting: Vascular Surgery

## 2019-12-25 ENCOUNTER — Encounter (INDEPENDENT_AMBULATORY_CARE_PROVIDER_SITE_OTHER): Payer: Self-pay | Admitting: Vascular Surgery

## 2019-12-25 ENCOUNTER — Other Ambulatory Visit: Payer: Self-pay

## 2019-12-25 VITALS — BP 134/81 | HR 77 | Resp 16 | Ht 65.5 in | Wt 245.4 lb

## 2019-12-25 DIAGNOSIS — Z86711 Personal history of pulmonary embolism: Secondary | ICD-10-CM | POA: Diagnosis not present

## 2019-12-25 DIAGNOSIS — M1610 Unilateral primary osteoarthritis, unspecified hip: Secondary | ICD-10-CM

## 2019-12-25 NOTE — Progress Notes (Signed)
MRN : [id number]  Savannah Wilkins is a 69 y.o. ([birth year]) female who presents with chief complaint of  Chief Complaint  Patient presents with  . New Patient (Initial Visit)    Ref Menz ivc filter  .  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  DVT was identified 5 years ago and was treated with anticoagulation.  The presenting symptom was chest pain, no pain or swelling in the lower extremity.  No SOB.  At that time there were no inciting events  The patient has not been using compression therapy at this point.  No SOB or pleuritic chest pains.  No cough or hemoptysis.  No blood per rectum or blood in any sputum.  No excessive bruising per the patient.     Current Meds  Medication Sig  . aspirin EC 81 MG tablet Take 81 mg by mouth daily.  . clobetasol ointment (TEMOVATE) 0.05 % clobetasol 0.05 % topical ointment  APP THIN LAYER EXT AA 2 TIMES A WK HS  . EMGALITY 120 MG/ML SOAJ   . Fluocinolone Acetonide 0.01 % OIL INT 4 GTS IN AU QHS PRN FOR DRYNESS AND ITCHING  . Multiple Vitamins-Minerals (PRESERVISION AREDS 2 PO) Take by mouth 2 (two) times daily.  . pneumococcal 13-valent conjugate vaccine (PREVNAR 13) SUSP injection Prevnar 13 (PF) 0.5 mL intramuscular syringe  ADM 0.5ML IM UTD    Past Medical History:  Diagnosis Date  . Arthritis   . DVT (deep venous thrombosis) ([place]) 2015   pulmonary emboli  . Headache   . History of kidney stones   . Kidney stone   . Lichen sclerosus   . Pneumonia 2015    Past Surgical History:  Procedure Laterality Date  . CHOLECYSTECTOMY  1996  . CYST EXCISION Right 2010   pinkie finger  . dcr Right    right eye  . EXTRACORPOREAL SHOCK WAVE LITHOTRIPSY Bilateral 2006   twice on left and once on right  . EXTRACORPOREAL SHOCK WAVE LITHOTRIPSY Right 07/25/2018   Procedure: EXTRACORPOREAL SHOCK WAVE LITHOTRIPSY (ESWL);   Surgeon: Billey Co, MD;  Location: [facility] ORS;  Service: Urology;  Laterality: Right;  . EYE SURGERY Bilateral 2013   cataract extractions  . HYSTEROSCOPY  2015  . kidney stone removal Right 2006, 2013   right ureteroscopic stone removals  . RETINAL TEAR REPAIR CRYOTHERAPY Bilateral   . TONSILLECTOMY  1984  . TUBAL LIGATION  1985    Social History Social History   Tobacco Use  . Smoking status: Never Smoker  . Smokeless tobacco: Never Used  Vaping Use  . Vaping Use: Never used  Substance Use Topics  . Alcohol use: Not Currently  . Drug use: Not Currently    Family History Family History  Problem Relation Age of Onset  . Stroke Mother   . Stroke Father   . AAA (abdominal aortic aneurysm) Father   . Prostate cancer Father   . Hypertension Father   No family history of bleeding/clotting disorders, porphyria or autoimmune disease   Allergies  Allergen Reactions  . Ciprofloxacin     tendonitis  . Penicillins Rash    Caused generalized rash   . Quinolones     Other reaction(s): Other (See Comments), Other (See Comments), Other (See Comments) Caused pain & cramping Caused severe tendonitis in feet Caused severe tendonitis in feet   .  Sulfa Antibiotics Rash    Caused generalized rash      REVIEW OF SYSTEMS (Negative unless checked)  Constitutional: [] Weight loss  [] Fever  [] Chills Cardiac: [] Chest pain   [] Chest pressure   [] Palpitations   [] Shortness of breath when laying flat   [] Shortness of breath with exertion. Vascular:  [] Pain in legs with walking   [] Pain in legs at rest  [x] History of DVT   [] Phlebitis   [] Swelling in legs   [] Varicose veins   [] Non-healing ulcers Pulmonary:   [] Uses home oxygen   [] Productive cough   [] Hemoptysis   [] Wheeze  [] COPD   [] Asthma Neurologic:  [] Dizziness   [] Seizures   [] History of stroke   [] History of TIA  [] Aphasia   [] Vissual changes   [] Weakness or numbness in arm   [] Weakness or numbness in leg Musculoskeletal:    [] Joint swelling   [x] Joint pain   [] Low back pain Hematologic:  [] Easy bruising  [] Easy bleeding   [] Hypercoagulable state   [] Anemic Gastrointestinal:  [] Diarrhea   [] Vomiting  [] Gastroesophageal reflux/heartburn   [] Difficulty swallowing. Genitourinary:  [] Chronic kidney disease   [] Difficult urination  [] Frequent urination   [] Blood in urine Skin:  [] Rashes   [] Ulcers  Psychological:  [] History of anxiety   []  History of major depression.  Physical Examination  Vitals:   12/25/19 1316  BP: 134/81  Pulse: 77  Resp: 16  Weight: 245 lb 6.4 oz (111.3 kg)  Height: 5' 5.5" (1.664 m)   Body mass index is 40.22 kg/m. Gen: WD/WN, NAD Head: [place]/AT, No temporalis wasting.  Ear/Nose/Throat: Hearing grossly intact, nares w/o erythema or drainage, poor dentition Eyes: PER, EOMI, sclera nonicteric.  Neck: Supple, no masses.  No bruit or JVD.  Pulmonary:  Good air movement, clear to auscultation bilaterally, no use of accessory muscles.  Cardiac: RRR, normal S1, S2, no Murmurs. Vascular:  Vessel Right Left  Radial Palpable Palpable  Gastrointestinal: soft, non-distended. No guarding/no peritoneal signs.  Musculoskeletal: M/S 5/5 throughout.  No deformity or atrophy.  Neurologic: CN 2-12 intact. Pain and light touch intact in extremities.  Symmetrical.  Speech is fluent. Motor exam as listed above. Psychiatric: Judgment intact, Mood & affect appropriate for pt's clinical situation. Dermatologic: No rashes or ulcers noted.  No changes consistent with cellulitis.  CBC No results found for: WBC, HGB, HCT, MCV, PLT  BMET    Component Value Date/Time   CREATININE 0.60 08/25/2018 1652   CrCl cannot be calculated (Patient's most recent lab result is older than the maximum 21 days allowed.).  COAG No results found for: INR, PROTIME  Radiology No results found.   Assessment/Plan 1. History of pulmonary embolus (PE) The patient will continue anticoagulation for now as there have not  been any problems or complications at this point.  IVC filter is strongly indicated prior to high risk orthopedic surgery.  Especially given the history of PE with the past DVT.  IVC filter placement will be done the week for surgery, will plan on IVC filter 02/03/2020. Risk and benefits were reviewed the patient.  Indications for the procedure were reviewed.  All questions were answered, the patient agrees to proceed.   I have had a long discussion with the patient regarding DVT and post phlebitic changes such as swelling and why it  causes symptoms such as pain.  The patient will wear graduated compression stockings class 1 (20-30 mmHg) on a daily basis a prescription was given. The patient will  beginning wearing the stockings  first thing in the morning and removing them in the evening. The patient is instructed specifically not to sleep in the stockings.  In addition, behavioral modification including elevation during the day and avoidance of prolonged dependency will be initiated.    The patient will follow-up with me in 3 months after the joint replacement surgery to discuss removal (this was also discussed today and the patient agrees with the plan to have the filter removed).    2. Arthritis, hip Continue NSAID medications as already ordered, these medications have been reviewed and there are no changes at this time.  Continued activity and therapy was stressed.     Hortencia Pilar, MD  12/25/2019 1:23 PM

## 2019-12-26 ENCOUNTER — Encounter (INDEPENDENT_AMBULATORY_CARE_PROVIDER_SITE_OTHER): Payer: Self-pay | Admitting: Vascular Surgery

## 2019-12-31 ENCOUNTER — Telehealth (INDEPENDENT_AMBULATORY_CARE_PROVIDER_SITE_OTHER): Payer: Self-pay

## 2019-12-31 NOTE — Telephone Encounter (Signed)
Spoke with the patient and she is scheduled with Dr. Delana Meyer for a IVC filter placement on 02/03/20 with a 6:45 am arrival time to the [facility]. Covid testing on 01/30/20 between 8-1 pm at the [place]. Pre-procedure instructions were discussed and will be mailed.

## 2020-01-23 ENCOUNTER — Inpatient Hospital Stay: Admission: RE | Admit: 2020-01-23 | Payer: Medicare PPO | Source: Ambulatory Visit

## 2020-01-29 ENCOUNTER — Encounter
Admission: RE | Admit: 2020-01-29 | Discharge: 2020-01-29 | Disposition: A | Discharge status: Home / Self Care | Payer: Medicare PPO | Source: Ambulatory Visit | Attending: Orthopedic Surgery | Admitting: Orthopedic Surgery

## 2020-01-29 ENCOUNTER — Encounter: Payer: Self-pay | Admitting: Urgent Care

## 2020-01-29 ENCOUNTER — Other Ambulatory Visit: Payer: Self-pay

## 2020-01-29 DIAGNOSIS — Z01818 Encounter for other preprocedural examination: Secondary | ICD-10-CM | POA: Diagnosis not present

## 2020-01-29 DIAGNOSIS — I82409 Acute embolism and thrombosis of unspecified deep veins of unspecified lower extremity: Secondary | ICD-10-CM | POA: Diagnosis not present

## 2020-01-29 HISTORY — DX: Nausea with vomiting, unspecified: R11.2

## 2020-01-29 HISTORY — DX: Restless legs syndrome: G25.81

## 2020-01-29 HISTORY — DX: Nausea with vomiting, unspecified: Z98.890

## 2020-01-29 LAB — URINALYSIS, ROUTINE W REFLEX MICROSCOPIC
Bacteria, UA: NONE SEEN
Bilirubin Urine: NEGATIVE
Glucose, UA: NEGATIVE mg/dL
Hgb urine dipstick: NEGATIVE
Ketones, ur: 5 mg/dL — AB
Nitrite: NEGATIVE
Protein, ur: NEGATIVE mg/dL
Specific Gravity, Urine: 1.031 — ABNORMAL HIGH (ref 1.005–1.030)
pH: 5 (ref 5.0–8.0)

## 2020-01-29 LAB — COMPREHENSIVE METABOLIC PANEL
ALT: 11 U/L (ref 0–44)
AST: 14 U/L — ABNORMAL LOW (ref 15–41)
Albumin: 3.8 g/dL (ref 3.5–5.0)
Alkaline Phosphatase: 82 U/L (ref 38–126)
Anion gap: 9 (ref 5–15)
BUN: 18 mg/dL (ref 8–23)
CO2: 27 mmol/L (ref 22–32)
Calcium: 9.4 mg/dL (ref 8.9–10.3)
Chloride: 104 mmol/L (ref 98–111)
Creatinine, Ser: 0.65 mg/dL (ref 0.44–1.00)
GFR, Estimated: >60 mL/min (ref >60)
Glucose, Bld: 90 mg/dL (ref 70–99)
Potassium: 3.6 mmol/L (ref 3.5–5.1)
Sodium: 140 mmol/L (ref 135–145)
Total Bilirubin: 0.4 mg/dL (ref 0.3–1.2)
Total Protein: 6.8 g/dL (ref 6.5–8.1)

## 2020-01-29 LAB — CBC WITH DIFFERENTIAL/PLATELET
Abs Immature Granulocytes: 0.02 10*3/uL (ref 0.00–0.07)
Basophils Absolute: 0.1 10*3/uL (ref 0.0–0.1)
Basophils Relative: 1 %
Eosinophils Absolute: 0.1 10*3/uL (ref 0.0–0.5)
Eosinophils Relative: 2 %
HCT: 42.3 % (ref 36.0–46.0)
Hemoglobin: 13.7 g/dL (ref 12.0–15.0)
Immature Granulocytes: 0 %
Lymphocytes Relative: 30 %
Lymphs Abs: 2.1 10*3/uL (ref 0.7–4.0)
MCH: 29.4 pg (ref 26.0–34.0)
MCHC: 32.4 g/dL (ref 30.0–36.0)
MCV: 90.8 fL (ref 80.0–100.0)
Monocytes Absolute: 0.4 10*3/uL (ref 0.1–1.0)
Monocytes Relative: 5 %
Neutro Abs: 4.3 10*3/uL (ref 1.7–7.7)
Neutrophils Relative %: 62 %
Platelets: 246 10*3/uL (ref 150–400)
RBC: 4.66 MIL/uL (ref 3.87–5.11)
RDW: 13.5 % (ref 11.5–15.5)
WBC: 6.9 10*3/uL (ref 4.0–10.5)
nRBC: 0 % (ref 0.0–0.2)

## 2020-01-29 LAB — TYPE AND SCREEN
ABO/RH(D): O POS
Antibody Screen: NEGATIVE

## 2020-01-29 LAB — SURGICAL PCR SCREEN
MRSA, PCR: NEGATIVE
Staphylococcus aureus: NEGATIVE

## 2020-01-29 NOTE — Progress Notes (Signed)
  [facility] Perioperative Services: Pre-Admission/Anesthesia Testing     Date: 01/29/20  Name: Savannah Wilkins MRN:   [id number]  Re: Change in Effort for upcoming surgery   Case: [id number] Date/Time: 02/03/20 0800   Procedure: IVC FILTER INSERTION (N/A )   Anesthesia type: Moderate Sedation   Diagnosis: Deep vein thrombosis (DVT) of lower extremity, unspecified chronicity, unspecified laterality, unspecified vein (HCC) [I82.409]   Pre-op diagnosis: IVC Filter Placement   DVT   Pt to have Covid test on 12-14   Location: [facility]   Providers: Schnier, Dolores Lory, MD    Primary attending surgeon was consulted regarding consideration of therapeutic change in antimicrobial agent being used for preoperative prophylaxis in this patient's upcoming surgical case. Following analysis of the risk versus benefits, Dr. Rudene Christians, Legrand Como, MD advising that it would be acceptable to discontinue the ordered clindamycin and place an order for cefazolin 2 gm IV on call to the OR. Orders for this patient were amended by me following collaborative conversation with attending surgeon.  Honor Loh, MSN, APRN, FNP-C, CEN [facility]  Peri-operative Services Nurse Practitioner Phone: [phone] 01/29/20 1:47 PM

## 2020-01-29 NOTE — Patient Instructions (Addendum)
Your procedure is scheduled on: Tuesday, January 25 Report to the Registration Desk on the 1st floor of the [facility]. To find out your arrival time, please call [phone] between 1PM - 3PM on: Monday, January 24  REMEMBER: Instructions that are not followed completely may result in serious medical risk, up to and including death; or upon the discretion of your surgeon and anesthesiologist your surgery may need to be rescheduled.  Do not eat food after midnight the night before surgery.  No gum chewing, lozengers or hard candies.  You may however, drink CLEAR liquids up to 2 hours before you are scheduled to arrive for your surgery. Do not drink anything within 2 hours of your scheduled arrival time.  Clear liquids include: - water  - apple juice without pulp - gatorade (not RED, PURPLE, OR BLUE) - black coffee or tea (Do NOT add milk or creamers to the coffee or tea) Do NOT drink anything that is not on this list.  In addition, your doctor has ordered for you to drink the provided  Ensure Pre-Surgery Clear Carbohydrate Drink  Drinking this carbohydrate drink up to two hours before surgery helps to reduce insulin resistance and improve patient outcomes. Please complete drinking 2 hours prior to scheduled arrival time.  DO NOT TAKE ANY MEDICATIONS THE MORNING OF SURGERY  One week prior to surgery: STARTING January 18 Stop ASPIRIN, Anti-inflammatories (NSAIDS) such as Advil, Aleve, Ibuprofen, Motrin, Naproxen, Naprosyn and Aspirin based products such as Excedrin, Goodys Powder, BC Powder. Stop ANY OVER THE COUNTER supplements until after surgery.  No Alcohol for 24 hours before or after surgery.  On the morning of surgery brush your teeth with toothpaste and water, you may rinse your mouth with mouthwash if you wish. Do not swallow any toothpaste or mouthwash.  Do not wear jewelry, make-up, hairpins, clips or nail polish.  Do not wear lotions, powders, or perfumes.   Do  not shave body from the neck down 48 hours prior to surgery just in case you cut yourself which could leave a site for infection.  Also, freshly shaved skin may become irritated if using the CHG soap.  Do not bring valuables to the hospital. [facility] is not responsible for any missing/lost belongings or valuables.   Use CHG Soap as directed on instruction sheet.  Notify your doctor if there is any change in your medical condition (cold, fever, infection).  Wear comfortable clothing (specific to your surgery type) to the hospital.  Plan for stool softeners for home use; pain medications have a tendency to cause constipation. You can also help prevent constipation by eating foods high in fiber such as fruits and vegetables and drinking plenty of fluids as your diet allows.  After surgery, you can help prevent lung complications by doing breathing exercises.  Take deep breaths and cough every 1-2 hours. Your doctor may order a device called an Incentive Spirometer to help you take deep breaths. When coughing or sneezing, hold a pillow firmly against your incision with both hands. This is called "splinting." Doing this helps protect your incision. It also decreases belly discomfort.  If you are being admitted to the hospital overnight, leave your suitcase in the car. After surgery it may be brought to your room.  If you are being discharged the day of surgery, you will not be allowed to drive home. You will need a responsible adult (18 years or older) to drive you home and stay with you that night.  If you are taking public transportation, you will need to have a responsible adult (18 years or older) with you. Please confirm with your physician that it is acceptable to use public transportation.   Please call the [place] [facility] at [phone] if you have any questions about these instructions.  Visitation Policy:  Patients undergoing a surgery or procedure may have one  family member or support person with them as long as that person is not COVID-19 positive or experiencing its symptoms.  That person may remain in the waiting area during the procedure.  Inpatient Visitation:    Visiting hours are 7 a.m. to 8 p.m. Patients will be allowed one visitor. The visitor may change daily. The visitor must pass COVID-19 screenings, use hand sanitizer when entering and exiting the patient's room and wear a mask at all times, including in the patient's room. Patients must also wear a mask when staff or their visitor are in the room. Masking is required regardless of vaccination status. Systemwide, no visitors 17 or younger.

## 2020-01-29 NOTE — Progress Notes (Signed)
[facility] Perioperative Services: Pre-Admission/Anesthesia Testing   Date: 01/29/20 Name: Mandalyn Pasqua MRN:   [id number]  Re: Consideration of preoperative prophylactic antibiotic change   Request sent to: Hessie Knows, MD (routed and/or faxed via [facility])  Planned Surgical Procedure(s):    Case: [id number] Date/Time: 02/03/20 0800   Procedure: IVC FILTER INSERTION (N/A )   Anesthesia type: Moderate Sedation   Diagnosis: Deep vein thrombosis (DVT) of lower extremity, unspecified chronicity, unspecified laterality, unspecified vein (HCC) [I82.409]   Pre-op diagnosis: IVC Filter Placement   DVT   Pt to have Covid test on 12-14   Location: [facility]   Providers: Delana Meyer Dolores Lory, MD    Notes: 1. Patient has a documented allergy to PCN  . Advising that PCN has caused her to experience low severity rash in the past.   2. Received cephalosporin with no documented complications . CEFTRIAXONE received on 07/24/2018  3. Screened as appropriate for cephalosporin use during medication reconciliation . No immediate angioedema, dysphagia, SOB, anaphylaxis symptoms. . No severe rash involving mucous membranes or skin necrosis. . No hospital admissions related to side effects of PCN/cephalosporin use.  . No documented reaction to PCN or cephalosporin in the last 10 years.  Request:  As an evidence based approach to reducing the rate of incidence for post-operative SSI and the development of MDROs, could an agent with narrower coverage for preoperative prophylaxis in this patient's upcoming surgical course be considered?   1. Currently ordered preoperative prophylactic ABX: clindamycin.   2. Specifically requesting change to cephalosporin (CEFAZOLIN).   3. Please communicate decision with me and I will change the orders in Epic as per your direction.   Things to consider:  Many patients report that they were "allergic" to PCN earlier in life,  however this does not translate into a true lifelong allergy. Patients can lose sensitivity to specific IgE antibodies over time if PCN is avoided (Kleris & Lugar, 2019).   Up to 10% of the adult population and 15% of hospitalized patients report an allergy to PCN, however clinical studies suggest that 90% of those reporting an allergy can tolerate PCN antibiotics (Kleris & Lugar, 2019).   Cross-sensitivity between PCN and cephalosporins has been documented as being as high as 10%, however this estimation included data believed to have been collected in a setting where there was contamination. Newer data suggests that the prevalence of cross-sensitivity between PCN and cephalosporins is actually estimated to be closer to 1% (Hermanides et al., 2018).    Patients labeled as PCN allergic, whether they are truly allergic or not, have been found to have inferior outcomes in terms of rates of serious infection, and these patients tend to have longer hospital stays ([place]heim, 2019).   Treatment related secondary infections, such as Clostridioides difficile, have been linked to the improper use of broad spectrum antibiotics in patients improperly labeled as PCN allergic (Kleris & Lugar, 2019).   Anaphylaxis from cephalosporins is rare and the evidence suggests that there is no increased risk of an anaphylactic type reaction when cephalosporins are used in a PCN allergic patient (Pichichero, 2006).  Citations: Hermanides J, Lemkes BA, Prins Pearla Dubonnet MW, Terreehorst I. Presumed ?-Lactam Allergy and Cross-reactivity in the Operating Theater: A Practical Approach. Anesthesiology. 2018 Aug;129(2):335-342. doi: 10.1097/ALN.0000000000002252. PMID: 46962952.  Kleris, [place]., & Lugar, P. L. (2019). Things We Do For No Reason: Failing to Question a Penicillin Allergy History. Journal of hospital medicine, 14(10), 626 597 2399.  Advance online publication. https://www.wallace-middleton.info/  Pichichero, M. E.  (2006). Cephalosporins can be prescribed safely for penicillin-allergic patients. Journal of family medicine, 55(2), 106-112. Accessed: https://cdn.mdedge.com/files/s70fs-public/Document/September-2017/5502JFP_AppliedEvidence1.pdf   Honor Loh, MSN, APRN, FNP-C, CEN [facility]  Peri-operative Services Nurse Practitioner FAX: [phone] 01/29/20 9:11 AM

## 2020-01-30 ENCOUNTER — Other Ambulatory Visit: Payer: Medicare PPO

## 2020-01-30 ENCOUNTER — Other Ambulatory Visit
Admission: RE | Admit: 2020-01-30 | Discharge: 2020-01-30 | Disposition: A | Discharge status: Home / Self Care | Payer: Medicare PPO | Source: Ambulatory Visit | Attending: Vascular Surgery | Admitting: Vascular Surgery

## 2020-01-30 DIAGNOSIS — Z01812 Encounter for preprocedural laboratory examination: Secondary | ICD-10-CM | POA: Diagnosis present

## 2020-01-30 DIAGNOSIS — Z20822 Contact with and (suspected) exposure to covid-19: Secondary | ICD-10-CM | POA: Insufficient documentation

## 2020-01-30 LAB — SARS CORONAVIRUS 2 (TAT 6-24 HRS): SARS Coronavirus 2: NEGATIVE

## 2020-01-31 ENCOUNTER — Encounter (INDEPENDENT_AMBULATORY_CARE_PROVIDER_SITE_OTHER): Payer: Self-pay

## 2020-02-01 ENCOUNTER — Other Ambulatory Visit (INDEPENDENT_AMBULATORY_CARE_PROVIDER_SITE_OTHER): Payer: Self-pay | Admitting: Nurse Practitioner

## 2020-02-03 ENCOUNTER — Encounter: Admission: RE | Payer: Self-pay | Source: Home / Self Care

## 2020-02-03 ENCOUNTER — Ambulatory Visit: Admission: RE | Admit: 2020-02-03 | Payer: Medicare PPO | Source: Home / Self Care | Admitting: Vascular Surgery

## 2020-02-03 DIAGNOSIS — I82409 Acute embolism and thrombosis of unspecified deep veins of unspecified lower extremity: Secondary | ICD-10-CM

## 2020-02-03 SURGERY — IVC FILTER INSERTION
Anesthesia: Moderate Sedation

## 2020-02-06 ENCOUNTER — Other Ambulatory Visit: Payer: Medicare PPO

## 2020-02-10 ENCOUNTER — Encounter: Admission: RE | Payer: Self-pay | Source: Home / Self Care

## 2020-02-10 ENCOUNTER — Inpatient Hospital Stay: Admission: RE | Admit: 2020-02-10 | Payer: Medicare PPO | Source: Home / Self Care | Admitting: Orthopedic Surgery

## 2020-02-10 SURGERY — ARTHROPLASTY, HIP, TOTAL, ANTERIOR APPROACH
Anesthesia: Choice | Site: Hip | Laterality: Right

## 2020-03-11 ENCOUNTER — Telehealth (INDEPENDENT_AMBULATORY_CARE_PROVIDER_SITE_OTHER): Payer: Self-pay

## 2020-03-11 ENCOUNTER — Other Ambulatory Visit: Payer: Self-pay | Admitting: Orthopedic Surgery

## 2020-03-11 NOTE — Telephone Encounter (Signed)
Spoke with the patient and she is scheduled with Dr. Delana Meyer for a IVC filter placement on 04/06/20 with a 6:45 am arrival time to the [facility]. Covid testing on 04/02/20 between 8-1 pm at the [place]. Pre-procedure instructions were discussed and will be mailed.

## 2020-03-30 ENCOUNTER — Other Ambulatory Visit: Payer: Medicare PPO

## 2020-04-02 ENCOUNTER — Other Ambulatory Visit: Payer: Self-pay

## 2020-04-02 ENCOUNTER — Other Ambulatory Visit
Admission: RE | Admit: 2020-04-02 | Discharge: 2020-04-02 | Disposition: A | Discharge status: Home / Self Care | Payer: Medicare PPO | Source: Ambulatory Visit | Attending: Vascular Surgery | Admitting: Vascular Surgery

## 2020-04-02 DIAGNOSIS — Z20822 Contact with and (suspected) exposure to covid-19: Secondary | ICD-10-CM | POA: Insufficient documentation

## 2020-04-02 DIAGNOSIS — Z01812 Encounter for preprocedural laboratory examination: Secondary | ICD-10-CM | POA: Insufficient documentation

## 2020-04-02 LAB — SARS CORONAVIRUS 2 (TAT 6-24 HRS): SARS Coronavirus 2: NEGATIVE

## 2020-04-05 ENCOUNTER — Other Ambulatory Visit (INDEPENDENT_AMBULATORY_CARE_PROVIDER_SITE_OTHER): Payer: Self-pay | Admitting: Nurse Practitioner

## 2020-04-06 ENCOUNTER — Encounter
Admission: RE | Disposition: A | Discharge status: Home / Self Care | Payer: Self-pay | Source: Home / Self Care | Attending: Vascular Surgery

## 2020-04-06 ENCOUNTER — Ambulatory Visit (HOSPITAL_BASED_OUTPATIENT_CLINIC_OR_DEPARTMENT_OTHER)
Admission: RE | Admit: 2020-04-06 | Discharge: 2020-04-06 | Disposition: A | Discharge status: Home / Self Care | Payer: Medicare PPO | Source: Home / Self Care | Attending: Vascular Surgery | Admitting: Vascular Surgery

## 2020-04-06 ENCOUNTER — Other Ambulatory Visit: Payer: Medicare PPO

## 2020-04-06 ENCOUNTER — Other Ambulatory Visit: Payer: Self-pay

## 2020-04-06 ENCOUNTER — Encounter: Payer: Self-pay | Admitting: Vascular Surgery

## 2020-04-06 DIAGNOSIS — M1610 Unilateral primary osteoarthritis, unspecified hip: Secondary | ICD-10-CM | POA: Diagnosis not present

## 2020-04-06 DIAGNOSIS — Z882 Allergy status to sulfonamides status: Secondary | ICD-10-CM | POA: Insufficient documentation

## 2020-04-06 DIAGNOSIS — Z880 Allergy status to penicillin: Secondary | ICD-10-CM | POA: Insufficient documentation

## 2020-04-06 DIAGNOSIS — I82409 Acute embolism and thrombosis of unspecified deep veins of unspecified lower extremity: Secondary | ICD-10-CM | POA: Diagnosis not present

## 2020-04-06 DIAGNOSIS — M1990 Unspecified osteoarthritis, unspecified site: Secondary | ICD-10-CM | POA: Insufficient documentation

## 2020-04-06 DIAGNOSIS — Z7982 Long term (current) use of aspirin: Secondary | ICD-10-CM | POA: Insufficient documentation

## 2020-04-06 DIAGNOSIS — Z86718 Personal history of other venous thrombosis and embolism: Secondary | ICD-10-CM | POA: Diagnosis not present

## 2020-04-06 DIAGNOSIS — Z79899 Other long term (current) drug therapy: Secondary | ICD-10-CM | POA: Insufficient documentation

## 2020-04-06 DIAGNOSIS — Z86711 Personal history of pulmonary embolism: Secondary | ICD-10-CM | POA: Insufficient documentation

## 2020-04-06 DIAGNOSIS — Z881 Allergy status to other antibiotic agents status: Secondary | ICD-10-CM | POA: Insufficient documentation

## 2020-04-06 HISTORY — PX: IVC FILTER INSERTION: CATH118245

## 2020-04-06 SURGERY — IVC FILTER INSERTION
Anesthesia: Moderate Sedation

## 2020-04-06 MED ORDER — IODIXANOL 320 MG/ML IV SOLN
INTRAVENOUS | Status: DC | PRN
  Administered 2020-04-06: 15 mL via INTRAVENOUS

## 2020-04-06 MED ORDER — CLINDAMYCIN PHOSPHATE 300 MG/50ML IV SOLN
INTRAVENOUS | Status: AC
  Administered 2020-04-06: 300 mg
  Filled 2020-04-06: qty 50

## 2020-04-06 MED ORDER — CLINDAMYCIN PHOSPHATE 300 MG/50ML IV SOLN: 300.0000 mg | Freq: Once | INTRAVENOUS | Status: DC

## 2020-04-06 MED ORDER — ONDANSETRON HCL 4 MG/2ML IJ SOLN: 4.0000 mg | Freq: Four times a day (QID) | INTRAMUSCULAR | Status: DC | PRN

## 2020-04-06 MED ORDER — MIDAZOLAM HCL 2 MG/2ML IJ SOLN
INTRAMUSCULAR | Status: DC | PRN
  Administered 2020-04-06: 1 mg via INTRAVENOUS
  Administered 2020-04-06: 2 mg via INTRAVENOUS

## 2020-04-06 MED ORDER — FAMOTIDINE 20 MG PO TABS: 40.0000 mg | ORAL_TABLET | Freq: Once | ORAL | Status: DC | PRN

## 2020-04-06 MED ORDER — MIDAZOLAM HCL 5 MG/5ML IJ SOLN
INTRAMUSCULAR | Status: AC
  Filled 2020-04-06: qty 5

## 2020-04-06 MED ORDER — MIDAZOLAM HCL 2 MG/ML PO SYRP: 8.0000 mg | ORAL_SOLUTION | Freq: Once | ORAL | Status: DC | PRN

## 2020-04-06 MED ORDER — FENTANYL CITRATE (PF) 100 MCG/2ML IJ SOLN
INTRAMUSCULAR | Status: AC
  Filled 2020-04-06: qty 2

## 2020-04-06 MED ORDER — METHYLPREDNISOLONE SODIUM SUCC 125 MG IJ SOLR: 125.0000 mg | Freq: Once | INTRAMUSCULAR | Status: DC | PRN

## 2020-04-06 MED ORDER — FENTANYL CITRATE (PF) 100 MCG/2ML IJ SOLN
INTRAMUSCULAR | Status: DC | PRN
  Administered 2020-04-06 (×2): 50 ug via INTRAVENOUS

## 2020-04-06 MED ORDER — DIPHENHYDRAMINE HCL 50 MG/ML IJ SOLN: 50.0000 mg | Freq: Once | INTRAMUSCULAR | Status: DC | PRN

## 2020-04-06 MED ORDER — SODIUM CHLORIDE 0.9 % IV SOLN: INTRAVENOUS | Status: DC

## 2020-04-06 MED ORDER — HYDROMORPHONE HCL 1 MG/ML IJ SOLN: 1.0000 mg | Freq: Once | INTRAMUSCULAR | Status: DC | PRN

## 2020-04-06 SURGICAL SUPPLY — 5 items
CANNULA 5F STIFF (CANNULA) ×2 IMPLANT
COVER PROBE U/S 5X48 (MISCELLANEOUS) ×2 IMPLANT
KIT FEMORAL DEL DENALI (Miscellaneous) ×2 IMPLANT
PACK ANGIOGRAPHY (CUSTOM PROCEDURE TRAY) ×2 IMPLANT
WIRE GUIDERIGHT .035X150 (WIRE) ×2 IMPLANT

## 2020-04-06 NOTE — Discharge Instructions (Signed)
Inferior Vena Cava Filter Insertion, Care After This sheet gives you information about how to care for yourself after your procedure. Your health care provider may also give you more specific instructions. If you have problems or questions, contact your health care provider. What can I expect after the procedure? After the procedure, it is common to have:  Mild pain in the area where the long, thin tube (catheter) used to deliver the filter was inserted.  Mild bruising in the area where the catheter used to deliver the filter was inserted. Follow these instructions at home: Insertion site care  Follow instructions from your health care provider about how to take care of your catheter insertion site. Make sure you: ? Wash your hands with soap and water for at least 20 seconds before and after you change your bandage (dressing). If soap and water are not available, use hand sanitizer. ? Change your dressing as told by your health care provider.  Keep the dressing and the insertion site clean and dry.  Check your insertion site every day for signs of infection. Check for: ? More redness, swelling, or pain. ? Fluid or blood. ? Warmth. ? Pus or a bad smell.  Do not take baths, swim, or use a hot tub until your health care provider approves. Ask your health care provider if you may take showers.   Activity  Avoid strenuous exercise or activities that take a lot of effort for 48 hours after the procedure or as told by your health care provider.  Do not go back to school or work until your health care provider approves.  Do not lift anything that is heavier than 5 lb (2.3 kg), or the limit that you are told, until your health care provider says that it is safe. Driving  If you were given a sedative during the procedure, it can affect you for several hours. Do not drive or operate machinery until your health care provider says that it is safe.  Ask your health care provider if the medicine  prescribed to you requires you to avoid driving or using heavy machinery. General instructions  Take over-the-counter and prescription medicines only as told by your health care provider.  Wear compression stockings as told by your health care provider. These stockings help to prevent blood clots and reduce swelling in your legs.  Keep all follow-up visits as told by your health care provider. This is important. Contact a health care provider if:  You have any of these signs of infection: ? More redness, swelling, or pain around your catheter insertion site. ? Fluid or blood coming from your insertion site. ? Warmth coming from your insertion site. ? Pus or a bad smell coming from your insertion site. ? A fever.  You are dizzy.  You have nausea and vomiting.  You develop a rash. Get help right away if:  You have chest pain, a cough, or difficulty breathing.  You have shortness of breath, feel faint, or pass out.  You cough up blood.  You have severe pain in your abdomen.  You develop swelling and discoloration or pain in your legs.  Your legs become pale and cold or blue.  You have weakness, difficulty moving your arms or legs, or balance problems.  You develop problems with speech or vision. These symptoms may represent a serious problem that is an emergency. Do not wait to see if the symptoms will go away. Get medical help right away. Call your local emergency   services (911 in the [place] Do not drive yourself to the hospital. Summary  After the procedure, it is common to have mild pain and bruising where a catheter was inserted at your neck or groin (catheter insertion site).  Do not shower, bathe, use a hot tub, or let the dressing get wet until your health care provider approves.  Every day, check for signs of infection at the catheter insertion site. This information is not intended to replace advice given to you by your health care provider. Make sure you discuss  any questions you have with your health care provider. Document Revised: 01/01/2019 Document Reviewed: 01/01/2019 Elsevier Patient Education  2021 [employer] Inc.  

## 2020-04-06 NOTE — Interval H&P Note (Signed)
History and Physical Interval Note:  04/06/2020 8:05 AM  Savannah Wilkins  has presented today for surgery, with the diagnosis of IVC filter placement   DVT Covid  March 18.  The various methods of treatment have been discussed with the patient and family. After consideration of risks, benefits and other options for treatment, the patient has consented to  Procedure(s): IVC FILTER INSERTION (N/A) as a surgical intervention.  The patient's history has been reviewed, patient examined, no change in status, stable for surgery.  I have reviewed the patient's chart and labs.  Questions were answered to the patient's satisfaction.     Hortencia Pilar

## 2020-04-06 NOTE — Op Note (Signed)
[facility]   OPERATIVE NOTE    PRE-OPERATIVE DIAGNOSIS: History of DVT; degenerative joint disease requiring hip replacement  POST-OPERATIVE DIAGNOSIS: Same  PROCEDURE: 1.   Ultrasound guidance for vascular access to the right common femoral vein 2.   Catheter placement into the inferior vena cava 3.   Inferior venacavogram 4.   Placement of a Denali IVC filter  SURGEON: Hortencia Pilar  ASSISTANT(S): None  ANESTHESIA: Conscious sedation was administered by the interventional radiology RN under my direct supervision. IV Versed plus fentanyl were utilized. Continuous ECG, pulse oximetry and blood pressure was monitored throughout the entire procedure. Conscious sedation was for a total of 14 minutes and 42 seconds.  ESTIMATED BLOOD LOSS: minimal  FINDING(S): 1.  Patent IVC  SPECIMEN(S):  none  INDICATIONS:   Savannah Wilkins is a 70 y.o. y.o. female who presents with upcoming total hip replacement.  She has a history of thrombotic episodes in the past and therefore inferior vena cava filter is indicated for this reason.  Risks and benefits including filter thrombosis, migration, fracture, bleeding, and infection were all discussed.  We discussed that all IVC filters that we place can be removed if desired from the patient once the need for the filter has passed.    DESCRIPTION: After obtaining full informed written consent, the patient was brought back to the vascular suite. The skin was sterilely prepped and draped in a sterile surgical field was created. Ultrasound was placed in a sterile sleeve. The right common femoral was echolucent and compressible indicating patency. Image was recorded for the permanent record. The puncture was made under continuous real-time ultrasound guidance.  The right common femoral was accessed under direct ultrasound guidance without difficulty with a micropuncture needle. Microwire was then advanced under fluoroscopic guidance  without difficulty. Micro-sheath was then inserted and a J-wire was then placed. The dilator is passed over the wire and the delivery sheath was placed into the inferior vena cava.  Inferior venacavogram was performed. This demonstrated a patent IVC with the level of the renal veins at L2.  The filter was then deployed into the inferior vena cava at the level of L3 just below the renal veins. The delivery sheath was then removed. Pressure was held. Sterile dressings were placed. The patient tolerated the procedure well and was taken to the recovery room in stable condition.  Interpretation: Inferior vena cava is widely patent.  Measures approximately 20 mm in diameter.  Denali filter deployed in vertical orientation  COMPLICATIONS: None  CONDITION: Stable  Hortencia Pilar  04/06/2020, 8:41 AM

## 2020-04-06 NOTE — H&P (Signed)
@LOGO @   MRN : [id number]  Savannah Wilkins is a 70 y.o. (1950/11/07) female who presents with chief complaint of No chief complaint on file. Marland Kitchen  History of Present Illness:   The patient presents to [facility] for placement of an IVC filter in association with DJD requiring joint replacement surgery.  DVT was identified 5 years ago and was treated with anticoagulation.  The presenting symptom was chest pain, no pain or swelling in the lower extremity.  No SOB.  At that time there were no inciting events  The patient has not been using compression therapy at this point.  No SOB or pleuritic chest pains.  No cough or hemoptysis.  No blood per rectum or blood in any sputum.  No excessive bruising per the patient.   Current Meds  Medication Sig  . aspirin EC 81 MG tablet Take 81 mg by mouth every evening.  Marland Kitchen aspirin-acetaminophen-caffeine (EXCEDRIN MIGRAINE) 250-250-65 MG tablet Take 1 tablet by mouth every 6 (six) hours as needed for headache.  . Carboxymethylcellul-Glycerin (REFRESH OPTIVE OP) Place 1 drop into both eyes daily as needed (dry eyes).  . clobetasol ointment (TEMOVATE) 6.29 % Apply 1 application topically daily as needed (lichen sclerosus).  Marland Kitchen EMGALITY 120 MG/ML SOAJ Inject 120 mg as directed every 30 (thirty) days.  . Multiple Vitamins-Minerals (PRESERVISION AREDS 2 PO) Take 1 capsule by mouth 2 (two) times daily.    Past Medical History:  Diagnosis Date  . Arthritis   . DVT (deep venous thrombosis) ([place]) 2015   pulmonary emboli  . Headache   . History of kidney stones   . Lichen sclerosus   . Pneumonia 2015  . PONV (postoperative nausea and vomiting)   . Pulmonary emboli ([place]) 2015  . Restless leg syndrome     Past Surgical History:  Procedure Laterality Date  . CHOLECYSTECTOMY  1996  . COLONOSCOPY  2015  . CYST EXCISION Right 2010   pinkie finger  . dcr Right    right eye  . EXTRACORPOREAL SHOCK WAVE LITHOTRIPSY Bilateral 2006   twice on left and once on right   . EXTRACORPOREAL SHOCK WAVE LITHOTRIPSY Right 07/25/2018   Procedure: EXTRACORPOREAL SHOCK WAVE LITHOTRIPSY (ESWL);  Surgeon: Billey Co, MD;  Location: [facility] ORS;  Service: Urology;  Laterality: Right;  . EYE SURGERY Bilateral 2013   cataract extractions  . HYSTEROSCOPY  2015  . kidney stone removal Right 2006, 2013   right ureteroscopic stone removals  . RETINAL TEAR REPAIR CRYOTHERAPY Bilateral    RIGHT 2007 AND 2008; LEFT 2012  . TONSILLECTOMY  1984  . TUBAL LIGATION  1985    Social History Social History   Tobacco Use  . Smoking status: Never Smoker  . Smokeless tobacco: Never Used  Vaping Use  . Vaping Use: Never used  Substance Use Topics  . Alcohol use: Not Currently  . Drug use: Not Currently    Family History Family History  Problem Relation Age of Onset  . Stroke Mother   . Stroke Father   . AAA (abdominal aortic aneurysm) Father   . Prostate cancer Father   . Hypertension Father     Allergies  Allergen Reactions  . Ciprofloxacin     tendonitis  . Penicillins Rash  . Quinolones     Caused pain & cramping Caused severe tendonitis in feet  . Sulfa Antibiotics Rash     REVIEW OF SYSTEMS (Negative unless checked)  Constitutional: [] Weight loss  [] Fever  [] Chills Cardiac: [] Chest  pain   [] Chest pressure   [] Palpitations   [] Shortness of breath when laying flat   [] Shortness of breath with exertion. Vascular:  [] Pain in legs with walking   [] Pain in legs at rest  [x] History of DVT   [] Phlebitis   [] Swelling in legs   [] Varicose veins   [] Non-healing ulcers Pulmonary:   [] Uses home oxygen   [] Productive cough   [] Hemoptysis   [] Wheeze  [] COPD   [] Asthma Neurologic:  [] Dizziness   [] Seizures   [] History of stroke   [] History of TIA  [] Aphasia   [] Vissual changes   [] Weakness or numbness in arm   [] Weakness or numbness in leg Musculoskeletal:   [] Joint swelling   [x] Joint pain   [] Low back pain Hematologic:  [] Easy bruising  [] Easy bleeding    [] Hypercoagulable state   [] Anemic Gastrointestinal:  [] Diarrhea   [] Vomiting  [] Gastroesophageal reflux/heartburn   [] Difficulty swallowing. Genitourinary:  [] Chronic kidney disease   [] Difficult urination  [] Frequent urination   [] Blood in urine Skin:  [] Rashes   [] Ulcers  Psychological:  [] History of anxiety   []  History of major depression.  Physical Examination  Vitals:   04/06/20 0721  BP: (!) 130/96  Pulse: 72  Resp: 19  Temp: 97.9 F (36.6 C)  TempSrc: Oral  SpO2: 96%  Weight: 108 kg  Height: 5\' 5"  (1.651 m)   Body mass index is 39.61 kg/m. Gen: WD/WN, NAD Head: [place]/AT, No temporalis wasting.  Ear/Nose/Throat: Hearing grossly intact, nares w/o erythema or drainage Eyes: PER, EOMI, sclera nonicteric.  Neck: Supple, no large masses.   Pulmonary:  Good air movement, no audible wheezing bilaterally, no use of accessory muscles.  Cardiac: RRR, no JVD Vascular: scattered varicosities present bilaterally.  Mild venous stasis changes to the legs bilaterally.  2+ soft pitting edema Vessel Right Left  Radial Palpable Palpable  Gastrointestinal: Non-distended. No guarding/no peritoneal signs.  Musculoskeletal: M/S 5/5 throughout.  No deformity or atrophy.  Neurologic: CN 2-12 intact. Symmetrical.  Speech is fluent. Motor exam as listed above. Psychiatric: Judgment intact, Mood & affect appropriate for pt's clinical situation. Dermatologic: venous rashes or ulcers noted.  No changes consistent with cellulitis. Lymph : No lichenification or skin changes of chronic lymphedema.  CBC Lab Results  Component Value Date   WBC 6.9 01/29/2020   HGB 13.7 01/29/2020   HCT 42.3 01/29/2020   MCV 90.8 01/29/2020   PLT 246 01/29/2020    BMET    Component Value Date/Time   NA 140 01/29/2020 0829   K 3.6 01/29/2020 0829   CL 104 01/29/2020 0829   CO2 27 01/29/2020 0829   GLUCOSE 90 01/29/2020 0829   BUN 18 01/29/2020 0829   CREATININE 0.65 01/29/2020 0829   CALCIUM 9.4 01/29/2020  0829   GFRNONAA >60 01/29/2020 0829   CrCl cannot be calculated (Patient's most recent lab result is older than the maximum 21 days allowed.).  COAG No results found for: INR, PROTIME  Radiology No results found.   Assessment/Plan 1. History of pulmonary embolus (PE) The patient will continue anticoagulation for now as there have not been any problems or complications at this point.  IVC filter is strongly indicated prior to high risk orthopedic surgery.  Especially given the history of PE with the past DVT.  IVC filter placement will be done the week for surgery, will plan on IVC filter 04/06/2020. Risk and benefits were reviewed the patient.  Indications for the procedure were reviewed.  All questions were answered, the  patient agrees to proceed.   I have had a long discussion with the patient regarding DVT and post phlebitic changes such as swelling and why it  causes symptoms such as pain.  The patient will wear graduated compression stockings class 1 (20-30 mmHg) on a daily basis a prescription was given. The patient will  beginning wearing the stockings first thing in the morning and removing them in the evening. The patient is instructed specifically not to sleep in the stockings.  In addition, behavioral modification including elevation during the day and avoidance of prolonged dependency will be initiated.    The patient will follow-up with me in 3 months after the joint replacement surgery to discuss removal (this was also discussed today and the patient agrees with the plan to have the filter removed).    2. Arthritis, hip Continue NSAID medications as already ordered, these medications have been reviewed and there are no changes at this time.  Continued activity and therapy was stressed.    Hortencia Pilar, MD  04/06/2020 7:33 AM

## 2020-04-08 ENCOUNTER — Encounter: Payer: Self-pay | Admitting: Orthopedic Surgery

## 2020-04-08 ENCOUNTER — Inpatient Hospital Stay: Payer: Medicare PPO | Admitting: Urgent Care

## 2020-04-08 ENCOUNTER — Encounter
Admission: RE | Disposition: A | Discharge status: Home / Self Care | Payer: Self-pay | Source: Home / Self Care | Attending: Orthopedic Surgery

## 2020-04-08 ENCOUNTER — Inpatient Hospital Stay: Payer: Medicare PPO

## 2020-04-08 ENCOUNTER — Other Ambulatory Visit: Payer: Self-pay

## 2020-04-08 ENCOUNTER — Inpatient Hospital Stay
Admission: RE | Admit: 2020-04-08 | Discharge: 2020-04-10 | DRG: 470 | Disposition: A | Discharge status: Home Health | Payer: Medicare PPO | Attending: Orthopedic Surgery | Admitting: Orthopedic Surgery

## 2020-04-08 DIAGNOSIS — G8918 Other acute postprocedural pain: Secondary | ICD-10-CM

## 2020-04-08 DIAGNOSIS — Z882 Allergy status to sulfonamides status: Secondary | ICD-10-CM | POA: Diagnosis not present

## 2020-04-08 DIAGNOSIS — Z9849 Cataract extraction status, unspecified eye: Secondary | ICD-10-CM | POA: Diagnosis not present

## 2020-04-08 DIAGNOSIS — Z7901 Long term (current) use of anticoagulants: Secondary | ICD-10-CM

## 2020-04-08 DIAGNOSIS — Z86711 Personal history of pulmonary embolism: Secondary | ICD-10-CM

## 2020-04-08 DIAGNOSIS — M8580 Other specified disorders of bone density and structure, unspecified site: Secondary | ICD-10-CM | POA: Diagnosis present

## 2020-04-08 DIAGNOSIS — Z8249 Family history of ischemic heart disease and other diseases of the circulatory system: Secondary | ICD-10-CM

## 2020-04-08 DIAGNOSIS — Z6841 Body Mass Index (BMI) 40.0 and over, adult: Secondary | ICD-10-CM | POA: Diagnosis not present

## 2020-04-08 DIAGNOSIS — Z79899 Other long term (current) drug therapy: Secondary | ICD-10-CM

## 2020-04-08 DIAGNOSIS — G43909 Migraine, unspecified, not intractable, without status migrainosus: Secondary | ICD-10-CM | POA: Diagnosis present

## 2020-04-08 DIAGNOSIS — G2581 Restless legs syndrome: Secondary | ICD-10-CM | POA: Diagnosis present

## 2020-04-08 DIAGNOSIS — M1611 Unilateral primary osteoarthritis, right hip: Principal | ICD-10-CM | POA: Diagnosis present

## 2020-04-08 DIAGNOSIS — Z888 Allergy status to other drugs, medicaments and biological substances status: Secondary | ICD-10-CM | POA: Diagnosis not present

## 2020-04-08 DIAGNOSIS — G5712 Meralgia paresthetica, left lower limb: Secondary | ICD-10-CM | POA: Diagnosis present

## 2020-04-08 DIAGNOSIS — Z880 Allergy status to penicillin: Secondary | ICD-10-CM

## 2020-04-08 DIAGNOSIS — Z419 Encounter for procedure for purposes other than remedying health state, unspecified: Secondary | ICD-10-CM

## 2020-04-08 DIAGNOSIS — Z86718 Personal history of other venous thrombosis and embolism: Secondary | ICD-10-CM | POA: Diagnosis not present

## 2020-04-08 DIAGNOSIS — M1712 Unilateral primary osteoarthritis, left knee: Secondary | ICD-10-CM | POA: Diagnosis present

## 2020-04-08 DIAGNOSIS — Z87442 Personal history of urinary calculi: Secondary | ICD-10-CM

## 2020-04-08 DIAGNOSIS — Z7982 Long term (current) use of aspirin: Secondary | ICD-10-CM

## 2020-04-08 DIAGNOSIS — Z823 Family history of stroke: Secondary | ICD-10-CM | POA: Diagnosis not present

## 2020-04-08 DIAGNOSIS — Z96649 Presence of unspecified artificial hip joint: Secondary | ICD-10-CM

## 2020-04-08 HISTORY — PX: TOTAL HIP ARTHROPLASTY: SHX124

## 2020-04-08 LAB — COMPREHENSIVE METABOLIC PANEL
ALT: 11 U/L (ref 0–44)
AST: 14 U/L — ABNORMAL LOW (ref 15–41)
Albumin: 4.2 g/dL (ref 3.5–5.0)
Alkaline Phosphatase: 71 U/L (ref 38–126)
Anion gap: 9 (ref 5–15)
BUN: 16 mg/dL (ref 8–23)
CO2: 21 mmol/L — ABNORMAL LOW (ref 22–32)
Calcium: 9 mg/dL (ref 8.9–10.3)
Chloride: 111 mmol/L (ref 98–111)
Creatinine, Ser: 0.56 mg/dL (ref 0.44–1.00)
GFR, Estimated: >60 mL/min (ref >60)
Glucose, Bld: 91 mg/dL (ref 70–99)
Potassium: 3.5 mmol/L (ref 3.5–5.1)
Sodium: 141 mmol/L (ref 135–145)
Total Bilirubin: 0.7 mg/dL (ref 0.3–1.2)
Total Protein: 7 g/dL (ref 6.5–8.1)

## 2020-04-08 LAB — CBC WITH DIFFERENTIAL/PLATELET
Abs Immature Granulocytes: 0.02 10*3/uL (ref 0.00–0.07)
Basophils Absolute: 0.1 10*3/uL (ref 0.0–0.1)
Basophils Relative: 1 %
Eosinophils Absolute: 0.1 10*3/uL (ref 0.0–0.5)
Eosinophils Relative: 1 %
HCT: 41.1 % (ref 36.0–46.0)
Hemoglobin: 13.6 g/dL (ref 12.0–15.0)
Immature Granulocytes: 0 %
Lymphocytes Relative: 21 %
Lymphs Abs: 1.7 10*3/uL (ref 0.7–4.0)
MCH: 29.1 pg (ref 26.0–34.0)
MCHC: 33.1 g/dL (ref 30.0–36.0)
MCV: 87.8 fL (ref 80.0–100.0)
Monocytes Absolute: 0.4 10*3/uL (ref 0.1–1.0)
Monocytes Relative: 5 %
Neutro Abs: 5.8 10*3/uL (ref 1.7–7.7)
Neutrophils Relative %: 72 %
Platelets: 217 10*3/uL (ref 150–400)
RBC: 4.68 MIL/uL (ref 3.87–5.11)
RDW: 13.6 % (ref 11.5–15.5)
WBC: 8.1 10*3/uL (ref 4.0–10.5)
nRBC: 0 % (ref 0.0–0.2)

## 2020-04-08 LAB — TYPE AND SCREEN
ABO/RH(D): O POS
Antibody Screen: NEGATIVE

## 2020-04-08 SURGERY — ARTHROPLASTY, HIP, TOTAL, ANTERIOR APPROACH
Anesthesia: Spinal | Site: Hip | Laterality: Right

## 2020-04-08 MED ORDER — METHOCARBAMOL 500 MG PO TABS
500.0000 mg | ORAL_TABLET | Freq: Four times a day (QID) | ORAL | Status: DC | PRN
  Administered 2020-04-08 – 2020-04-10 (×3): 500 mg via ORAL
  Filled 2020-04-08 (×3): qty 1

## 2020-04-08 MED ORDER — PHENOL 1.4 % MT LIQD
1.0000 | OROMUCOSAL | Status: DC | PRN
  Filled 2020-04-08: qty 177

## 2020-04-08 MED ORDER — PROPOFOL 500 MG/50ML IV EMUL
INTRAVENOUS | Status: AC
  Filled 2020-04-08: qty 50

## 2020-04-08 MED ORDER — HYDROCODONE-ACETAMINOPHEN 7.5-325 MG PO TABS
1.0000 | ORAL_TABLET | ORAL | Status: DC | PRN
Start: 2020-04-08 — End: 2020-04-10

## 2020-04-08 MED ORDER — OXYCODONE HCL 5 MG PO TABS: 5.0000 mg | ORAL_TABLET | Freq: Once | ORAL | Status: DC | PRN

## 2020-04-08 MED ORDER — LIDOCAINE HCL (CARDIAC) PF 100 MG/5ML IV SOSY
PREFILLED_SYRINGE | INTRAVENOUS | Status: DC | PRN
  Administered 2020-04-08: 80 mg via INTRAVENOUS

## 2020-04-08 MED ORDER — FENTANYL CITRATE (PF) 100 MCG/2ML IJ SOLN
INTRAMUSCULAR | Status: AC
  Filled 2020-04-08: qty 2

## 2020-04-08 MED ORDER — EPHEDRINE SULFATE 50 MG/ML IJ SOLN
INTRAMUSCULAR | Status: DC | PRN
  Administered 2020-04-08 (×2): 5 mg via INTRAVENOUS
  Administered 2020-04-08: 10 mg via INTRAVENOUS

## 2020-04-08 MED ORDER — PROPOFOL 500 MG/50ML IV EMUL
INTRAVENOUS | Status: DC | PRN
  Administered 2020-04-08: 30 mg via INTRAVENOUS
  Administered 2020-04-08: 20 mg via INTRAVENOUS

## 2020-04-08 MED ORDER — ASPIRIN EC 81 MG PO TBEC
81.0000 mg | DELAYED_RELEASE_TABLET | Freq: Every evening | ORAL | Status: DC
  Administered 2020-04-08 – 2020-04-09 (×2): 81 mg via ORAL
  Filled 2020-04-08 (×2): qty 1

## 2020-04-08 MED ORDER — PANTOPRAZOLE SODIUM 40 MG PO TBEC
40.0000 mg | DELAYED_RELEASE_TABLET | Freq: Every day | ORAL | Status: DC
  Administered 2020-04-08 – 2020-04-10 (×3): 40 mg via ORAL
  Filled 2020-04-08 (×3): qty 1

## 2020-04-08 MED ORDER — MORPHINE SULFATE (PF) 2 MG/ML IV SOLN: 0.5000 mg | INTRAVENOUS | Status: DC | PRN

## 2020-04-08 MED ORDER — CEFAZOLIN SODIUM-DEXTROSE 2-4 GM/100ML-% IV SOLN
2.0000 g | Freq: Four times a day (QID) | INTRAVENOUS | Status: AC
  Administered 2020-04-08 (×2): 2 g via INTRAVENOUS
  Filled 2020-04-08 (×2): qty 100

## 2020-04-08 MED ORDER — GLYCOPYRROLATE 0.2 MG/ML IJ SOLN
INTRAMUSCULAR | Status: DC | PRN
  Administered 2020-04-08: .2 mg via INTRAVENOUS

## 2020-04-08 MED ORDER — GLYCOPYRROLATE 0.2 MG/ML IJ SOLN
INTRAMUSCULAR | Status: AC
  Filled 2020-04-08: qty 1

## 2020-04-08 MED ORDER — MAGNESIUM HYDROXIDE 400 MG/5ML PO SUSP
30.0000 mL | Freq: Every day | ORAL | Status: DC | PRN
  Administered 2020-04-10: 30 mL via ORAL
  Filled 2020-04-08: qty 30

## 2020-04-08 MED ORDER — SODIUM CHLORIDE 0.9 % IV SOLN: INTRAVENOUS | Status: DC

## 2020-04-08 MED ORDER — OCUVITE-LUTEIN PO CAPS
1.0000 | ORAL_CAPSULE | Freq: Two times a day (BID) | ORAL | Status: DC
  Administered 2020-04-08 – 2020-04-10 (×5): 1 via ORAL
  Filled 2020-04-08 (×7): qty 1

## 2020-04-08 MED ORDER — HYDROCODONE-ACETAMINOPHEN 5-325 MG PO TABS
1.0000 | ORAL_TABLET | ORAL | Status: DC | PRN
  Administered 2020-04-08 – 2020-04-09 (×3): 2 via ORAL
  Filled 2020-04-08 (×3): qty 2

## 2020-04-08 MED ORDER — KETAMINE HCL 10 MG/ML IJ SOLN
INTRAMUSCULAR | Status: DC | PRN
  Administered 2020-04-08: 30 mg via INTRAVENOUS

## 2020-04-08 MED ORDER — BUPIVACAINE-EPINEPHRINE (PF) 0.25% -1:200000 IJ SOLN
INTRAMUSCULAR | Status: AC
  Filled 2020-04-08: qty 30

## 2020-04-08 MED ORDER — FENTANYL CITRATE (PF) 100 MCG/2ML IJ SOLN
25.0000 ug | INTRAMUSCULAR | Status: DC | PRN
  Administered 2020-04-08 (×2): 25 ug via INTRAVENOUS

## 2020-04-08 MED ORDER — BUPIVACAINE LIPOSOME 1.3 % IJ SUSP
INTRAMUSCULAR | Status: AC
  Filled 2020-04-08: qty 20

## 2020-04-08 MED ORDER — MIDAZOLAM HCL 2 MG/2ML IJ SOLN
INTRAMUSCULAR | Status: DC | PRN
  Administered 2020-04-08: 2 mg via INTRAVENOUS

## 2020-04-08 MED ORDER — ONDANSETRON HCL 4 MG PO TABS: 4.0000 mg | ORAL_TABLET | Freq: Four times a day (QID) | ORAL | Status: DC | PRN

## 2020-04-08 MED ORDER — NEOMYCIN-POLYMYXIN B GU 40-200000 IR SOLN
Status: AC
  Filled 2020-04-08: qty 4

## 2020-04-08 MED ORDER — PROPOFOL 10 MG/ML IV BOLUS
INTRAVENOUS | Status: AC
  Filled 2020-04-08: qty 20

## 2020-04-08 MED ORDER — BUPIVACAINE HCL (PF) 0.5 % IJ SOLN
INTRAMUSCULAR | Status: AC
  Filled 2020-04-08: qty 20

## 2020-04-08 MED ORDER — BUPIVACAINE-EPINEPHRINE 0.25% -1:200000 IJ SOLN
INTRAMUSCULAR | Status: DC | PRN
  Administered 2020-04-08: 30 mL

## 2020-04-08 MED ORDER — FAMOTIDINE 20 MG PO TABS
ORAL_TABLET | ORAL | Status: AC
  Administered 2020-04-08: 20 mg via ORAL
  Filled 2020-04-08: qty 1

## 2020-04-08 MED ORDER — LIDOCAINE HCL (PF) 2 % IJ SOLN
INTRAMUSCULAR | Status: AC
  Filled 2020-04-08: qty 5

## 2020-04-08 MED ORDER — FAMOTIDINE 20 MG PO TABS: 20.0000 mg | ORAL_TABLET | Freq: Once | ORAL | Status: AC

## 2020-04-08 MED ORDER — SODIUM CHLORIDE 0.9 % IV SOLN
INTRAVENOUS | Status: DC | PRN
  Administered 2020-04-08: 60 mL

## 2020-04-08 MED ORDER — TRAMADOL HCL 50 MG PO TABS
50.0000 mg | ORAL_TABLET | Freq: Four times a day (QID) | ORAL | Status: DC
Start: 2020-04-08 — End: 2020-04-10
  Administered 2020-04-08 – 2020-04-10 (×7): 50 mg via ORAL
  Filled 2020-04-08 (×7): qty 1

## 2020-04-08 MED ORDER — METOCLOPRAMIDE HCL 5 MG/ML IJ SOLN
5.0000 mg | Freq: Three times a day (TID) | INTRAMUSCULAR | Status: DC | PRN
Start: 2020-04-08 — End: 2020-04-10

## 2020-04-08 MED ORDER — ONDANSETRON HCL 4 MG/2ML IJ SOLN: 4.0000 mg | Freq: Four times a day (QID) | INTRAMUSCULAR | Status: DC | PRN

## 2020-04-08 MED ORDER — PROPOFOL 500 MG/50ML IV EMUL
INTRAVENOUS | Status: DC | PRN
  Administered 2020-04-08: 75 ug/kg/min via INTRAVENOUS

## 2020-04-08 MED ORDER — CHLORHEXIDINE GLUCONATE 0.12 % MT SOLN
OROMUCOSAL | Status: AC
  Administered 2020-04-08: 15 mL via OROMUCOSAL
  Filled 2020-04-08: qty 15

## 2020-04-08 MED ORDER — BUPIVACAINE HCL (PF) 0.5 % IJ SOLN
INTRAMUSCULAR | Status: DC | PRN
  Administered 2020-04-08: 2.5 mL

## 2020-04-08 MED ORDER — ALUM & MAG HYDROXIDE-SIMETH 200-200-20 MG/5ML PO SUSP
30.0000 mL | ORAL | Status: DC | PRN
  Administered 2020-04-09: 30 mL via ORAL
  Filled 2020-04-08: qty 30

## 2020-04-08 MED ORDER — OXYCODONE HCL 5 MG/5ML PO SOLN: 5.0000 mg | Freq: Once | ORAL | Status: DC | PRN

## 2020-04-08 MED ORDER — CEFAZOLIN SODIUM-DEXTROSE 2-4 GM/100ML-% IV SOLN
INTRAVENOUS | Status: AC
  Filled 2020-04-08: qty 100

## 2020-04-08 MED ORDER — ACETAMINOPHEN 325 MG PO TABS: 325.0000 mg | ORAL_TABLET | Freq: Four times a day (QID) | ORAL | Status: DC | PRN

## 2020-04-08 MED ORDER — ASPIRIN-ACETAMINOPHEN-CAFFEINE 250-250-65 MG PO TABS
1.0000 | ORAL_TABLET | Freq: Four times a day (QID) | ORAL | Status: DC | PRN
  Filled 2020-04-08: qty 1

## 2020-04-08 MED ORDER — LACTATED RINGERS IV SOLN: INTRAVENOUS | Status: DC

## 2020-04-08 MED ORDER — DIPHENHYDRAMINE HCL 12.5 MG/5ML PO ELIX: 12.5000 mg | ORAL_SOLUTION | ORAL | Status: DC | PRN

## 2020-04-08 MED ORDER — MENTHOL 3 MG MT LOZG
1.0000 | LOZENGE | OROMUCOSAL | Status: DC | PRN
Start: 2020-04-08 — End: 2020-04-10
  Filled 2020-04-08: qty 9

## 2020-04-08 MED ORDER — ENOXAPARIN SODIUM 40 MG/0.4ML ~~LOC~~ SOLN
40.0000 mg | SUBCUTANEOUS | Status: DC
  Administered 2020-04-09: 40 mg via SUBCUTANEOUS
  Filled 2020-04-08: qty 0.4

## 2020-04-08 MED ORDER — KETAMINE HCL 50 MG/5ML IJ SOSY
PREFILLED_SYRINGE | INTRAMUSCULAR | Status: AC
  Filled 2020-04-08: qty 5

## 2020-04-08 MED ORDER — SODIUM CHLORIDE FLUSH 0.9 % IV SOLN
INTRAVENOUS | Status: AC
  Filled 2020-04-08: qty 40

## 2020-04-08 MED ORDER — SODIUM CHLORIDE 0.9 % IV SOLN
INTRAVENOUS | Status: DC | PRN
  Administered 2020-04-08: 40 ug/min via INTRAVENOUS

## 2020-04-08 MED ORDER — ONDANSETRON HCL 4 MG/2ML IJ SOLN
INTRAMUSCULAR | Status: DC | PRN
  Administered 2020-04-08: 4 mg via INTRAVENOUS

## 2020-04-08 MED ORDER — MIDAZOLAM HCL 2 MG/2ML IJ SOLN
INTRAMUSCULAR | Status: AC
  Filled 2020-04-08: qty 2

## 2020-04-08 MED ORDER — POLYVINYL ALCOHOL 1.4 % OP SOLN
1.0000 [drp] | Freq: Three times a day (TID) | OPHTHALMIC | Status: DC | PRN
  Filled 2020-04-08: qty 15

## 2020-04-08 MED ORDER — ONDANSETRON HCL 4 MG/2ML IJ SOLN
INTRAMUSCULAR | Status: AC
  Filled 2020-04-08: qty 2

## 2020-04-08 MED ORDER — DOCUSATE SODIUM 100 MG PO CAPS
100.0000 mg | ORAL_CAPSULE | Freq: Two times a day (BID) | ORAL | Status: DC
  Administered 2020-04-08 – 2020-04-10 (×5): 100 mg via ORAL
  Filled 2020-04-08 (×5): qty 1

## 2020-04-08 MED ORDER — PHENYLEPHRINE HCL (PRESSORS) 10 MG/ML IV SOLN
INTRAVENOUS | Status: DC | PRN
  Administered 2020-04-08 (×3): 100 ug via INTRAVENOUS

## 2020-04-08 MED ORDER — MAGNESIUM CITRATE PO SOLN
1.0000 | Freq: Once | ORAL | Status: AC | PRN
  Administered 2020-04-10: 1 via ORAL
  Filled 2020-04-08 (×2): qty 296

## 2020-04-08 MED ORDER — CHLORHEXIDINE GLUCONATE 0.12 % MT SOLN: 15.0000 mL | Freq: Once | OROMUCOSAL | Status: AC

## 2020-04-08 MED ORDER — METHOCARBAMOL 1000 MG/10ML IJ SOLN
500.0000 mg | Freq: Four times a day (QID) | INTRAVENOUS | Status: DC | PRN
  Filled 2020-04-08: qty 5

## 2020-04-08 MED ORDER — FENTANYL CITRATE (PF) 100 MCG/2ML IJ SOLN
INTRAMUSCULAR | Status: DC | PRN
  Administered 2020-04-08 (×2): 25 ug via INTRAVENOUS

## 2020-04-08 MED ORDER — NEOMYCIN-POLYMYXIN B GU 40-200000 IR SOLN
Status: DC | PRN
  Administered 2020-04-08: 4 mL

## 2020-04-08 MED ORDER — ORAL CARE MOUTH RINSE: 15.0000 mL | Freq: Once | OROMUCOSAL | Status: AC

## 2020-04-08 MED ORDER — METOCLOPRAMIDE HCL 10 MG PO TABS: 5.0000 mg | ORAL_TABLET | Freq: Three times a day (TID) | ORAL | Status: DC | PRN

## 2020-04-08 MED ORDER — EPHEDRINE 5 MG/ML INJ
INTRAVENOUS | Status: AC
  Filled 2020-04-08: qty 20

## 2020-04-08 MED ORDER — BISACODYL 10 MG RE SUPP: 10.0000 mg | Freq: Every day | RECTAL | Status: DC | PRN

## 2020-04-08 MED ORDER — FENTANYL CITRATE (PF) 100 MCG/2ML IJ SOLN
INTRAMUSCULAR | Status: AC
  Administered 2020-04-08: 25 ug via INTRAVENOUS
  Filled 2020-04-08: qty 2

## 2020-04-08 MED ORDER — CEFAZOLIN SODIUM-DEXTROSE 2-4 GM/100ML-% IV SOLN: 2.0000 g | Freq: Once | INTRAVENOUS | Status: DC

## 2020-04-08 MED ORDER — CEFAZOLIN SODIUM-DEXTROSE 2-4 GM/100ML-% IV SOLN
2.0000 g | INTRAVENOUS | Status: AC
  Administered 2020-04-08: 2 g via INTRAVENOUS

## 2020-04-08 MED ORDER — ZOLPIDEM TARTRATE 5 MG PO TABS: 5.0000 mg | ORAL_TABLET | Freq: Every evening | ORAL | Status: DC | PRN

## 2020-04-08 SURGICAL SUPPLY — 61 items
BLADE SAGITTAL AGGR TOOTH XLG (BLADE) ×2 IMPLANT
BNDG COHESIVE 6X5 TAN STRL LF (GAUZE/BANDAGES/DRESSINGS) ×6 IMPLANT
CANISTER SUCT 1200ML W/VALVE (MISCELLANEOUS) ×2 IMPLANT
CANISTER WOUND CARE 500ML ATS (WOUND CARE) ×2 IMPLANT
CHLORAPREP W/TINT 26 (MISCELLANEOUS) ×2 IMPLANT
COVER BACK TABLE REUSABLE LG (DRAPES) ×2 IMPLANT
COVER WAND RF STERILE (DRAPES) ×2 IMPLANT
DRAPE 3/4 80X56 (DRAPES) ×6 IMPLANT
DRAPE C-ARM XRAY 36X54 (DRAPES) ×2 IMPLANT
DRAPE INCISE IOBAN 66X60 STRL (DRAPES) IMPLANT
DRAPE POUCH INSTRU U-SHP 10X18 (DRAPES) ×2 IMPLANT
DRESSING SURGICEL FIBRLLR 1X2 (HEMOSTASIS) ×2 IMPLANT
DRSG MEPILEX SACRM 8.7X9.8 (GAUZE/BANDAGES/DRESSINGS) ×2 IMPLANT
DRSG OPSITE POSTOP 4X8 (GAUZE/BANDAGES/DRESSINGS) ×4 IMPLANT
DRSG SURGICEL FIBRILLAR 1X2 (HEMOSTASIS) ×4
ELECT BLADE 6.5 EXT (BLADE) ×2 IMPLANT
ELECT REM PT RETURN 9FT ADLT (ELECTROSURGICAL) ×2
ELECTRODE REM PT RTRN 9FT ADLT (ELECTROSURGICAL) ×1 IMPLANT
GLOVE SURG SYN 9.0  PF PI (GLOVE) ×2
GLOVE SURG SYN 9.0 PF PI (GLOVE) ×2 IMPLANT
GLOVE SURG UNDER POLY LF SZ9 (GLOVE) ×2 IMPLANT
GOWN SRG 2XL LVL 4 RGLN SLV (GOWNS) ×1 IMPLANT
GOWN STRL NON-REIN 2XL LVL4 (GOWNS) ×1
GOWN STRL REUS W/ TWL LRG LVL3 (GOWN DISPOSABLE) ×1 IMPLANT
GOWN STRL REUS W/TWL LRG LVL3 (GOWN DISPOSABLE) ×1
HEMOVAC 400CC 10FR (MISCELLANEOUS) IMPLANT
HIP DBL LINER 54X28 (Liner) ×2 IMPLANT
HIP FEM HD S 28 (Head) ×2 IMPLANT
HOLDER FOLEY CATH W/STRAP (MISCELLANEOUS) ×2 IMPLANT
HOOD PEEL AWAY FLYTE STAYCOOL (MISCELLANEOUS) ×2 IMPLANT
IRRIGATION SURGIPHOR STRL (IV SOLUTION) IMPLANT
KIT PREVENA INCISION MGT 13 (CANNISTER) ×2 IMPLANT
MANIFOLD NEPTUNE II (INSTRUMENTS) ×2 IMPLANT
MASTERLOC HIP LATERAL S5 (Hips) ×2 IMPLANT
MAT ABSORB  FLUID 56X50 GRAY (MISCELLANEOUS) ×1
MAT ABSORB FLUID 56X50 GRAY (MISCELLANEOUS) ×1 IMPLANT
NDL SAFETY ECLIPSE 18X1.5 (NEEDLE) ×1 IMPLANT
NEEDLE HYPO 18GX1.5 SHARP (NEEDLE) ×1
NEEDLE SPNL 20GX3.5 QUINCKE YW (NEEDLE) ×4 IMPLANT
NS IRRIG 1000ML POUR BTL (IV SOLUTION) ×2 IMPLANT
PACK HIP COMPR (MISCELLANEOUS) ×2 IMPLANT
SCALPEL PROTECTED #10 DISP (BLADE) ×4 IMPLANT
SHELL ACETABULAR SZ 54 DM (Shell) ×2 IMPLANT
SOL PREP PVP 2OZ (MISCELLANEOUS) ×2
SOLUTION PREP PVP 2OZ (MISCELLANEOUS) ×1 IMPLANT
SPONGE DRAIN TRACH 4X4 STRL 2S (GAUZE/BANDAGES/DRESSINGS) ×2 IMPLANT
STAPLER SKIN PROX 35W (STAPLE) ×2 IMPLANT
STRAP SAFETY 5IN WIDE (MISCELLANEOUS) ×2 IMPLANT
SUT DVC 2 QUILL PDO  T11 36X36 (SUTURE) ×1
SUT DVC 2 QUILL PDO T11 36X36 (SUTURE) ×1 IMPLANT
SUT SILK 0 (SUTURE) ×1
SUT SILK 0 30XBRD TIE 6 (SUTURE) ×1 IMPLANT
SUT V-LOC 90 ABS DVC 3-0 CL (SUTURE) ×2 IMPLANT
SUT VIC AB 1 CT1 36 (SUTURE) ×2 IMPLANT
SYR 20ML LL LF (SYRINGE) ×2 IMPLANT
SYR 30ML LL (SYRINGE) ×2 IMPLANT
SYR 50ML LL SCALE MARK (SYRINGE) ×4 IMPLANT
SYR BULB IRRIG 60ML STRL (SYRINGE) ×2 IMPLANT
TAPE MICROFOAM 4IN (TAPE) ×2 IMPLANT
TOWEL OR 17X26 4PK STRL BLUE (TOWEL DISPOSABLE) ×2 IMPLANT
TRAY FOLEY MTR SLVR 16FR STAT (SET/KITS/TRAYS/PACK) ×2 IMPLANT

## 2020-04-08 NOTE — Evaluation (Signed)
Physical Therapy Evaluation Patient Details Name: Savannah Wilkins MRN: [id number] DOB: January 29, 1950 Today's Date: 04/08/2020   History of Present Illness  Pt admitted for R THR. History of restless legs and PE.  Clinical Impression  Pt is a pleasant 70 year old female who was admitted for R THR. Pt performs bed mobility, transfers, and ambulation with min assist and RW. Pt demonstrates deficits with strength/endurance/mobility. Is very anxious throughout evaluation, perseverating on the pain in her hip. Encouraged to attempt mobility as position change is beneficial. Able to ambulate to recliner with assistance, however limited due to dizziness. Reviewed written HEP/precautions/expectations with therapy. Would benefit from skilled PT to address above deficits and promote optimal return to PLOF. Recommend transition to [place] upon discharge from acute hospitalization.     Follow Up Recommendations Home health PT;Supervision/Assistance - 24 hour    Equipment Recommendations  Rolling walker with 5" wheels    Recommendations for Other Services       Precautions / Restrictions Precautions Precautions: Anterior Hip;Fall Precaution Booklet Issued: Yes (comment) Restrictions Weight Bearing Restrictions: Yes RLE Weight Bearing: Weight bearing as tolerated      Mobility  Bed Mobility Overal bed mobility: Needs Assistance Bed Mobility: Supine to Sit     Supine to sit: Min assist     General bed mobility comments: Safe technique with ability to follow commands well. Very anxious throughout mobility efforts with labile emotions. Once seated, reports pain decreased    Transfers Overall transfer level: Needs assistance Equipment used: Rolling walker (2 wheeled) Transfers: Sit to/from Stand Sit to Stand: Min assist         General transfer comment: cues for pushing from seated surface. RW used  Ambulation/Gait Ambulation/Gait assistance: Min Web designer (Feet): 5  Feet Assistive device: Rolling walker (2 wheeled) Gait Pattern/deviations: Step-to pattern     General Gait Details: limited by pain and anxiety. Became dizzy with ambulation, further distance deferred. VItals assessed and WNL. Pt attributes that pain meds affect her. Very slow step to gait pattern performed  Stairs            Wheelchair Mobility    Modified Rankin (Stroke Patients Only)       Balance Overall balance assessment: Needs assistance Sitting-balance support: Feet supported Sitting balance-Leahy Scale: Good     Standing balance support: Bilateral upper extremity supported Standing balance-Leahy Scale: Good                               Pertinent Vitals/Pain Pain Assessment: 0-10 Pain Score: 4  Pain Location: R hip Pain Descriptors / Indicators: Operative site guarding Pain Intervention(s): Limited activity within patient's tolerance;Ice applied;Repositioned    Home Living Family/patient expects to be discharged to:: Private residence Living Arrangements: Spouse/significant other Available Help at Discharge: Family Type of Home: Group Home Home Access: Stairs to enter;Ramped entrance Entrance Stairs-Rails: None Entrance Stairs-Number of Steps: 1 Home Layout: One level Home Equipment: [place] - single point;Grab bars - toilet Additional Comments: Has ramp in front enterance and 1 step to enter through garage. Has handles on toilet, but not an elevated seat. Reports she is ok with transfers on/off toliet using the handles    Prior Function Level of Independence: Independent with assistive device(s)         Comments: using SPC at baseline     Hand Dominance        Extremity/Trunk Assessment   Upper Extremity Assessment Upper  Extremity Assessment: Generalized weakness (B UE grossly 4/5)    Lower Extremity Assessment Lower Extremity Assessment: Generalized weakness (R LE grossly 3/5; L LE grossly 4/5)       Communication    Communication: No difficulties  Cognition Arousal/Alertness: Awake/alert Behavior During Therapy: WFL for tasks assessed/performed Overall Cognitive Status: Within Functional Limits for tasks assessed                                        General Comments      Exercises Other Exercises Other Exercises: seated ther-ex performed on R LE including AP, quad sets, glut sets, and hip abd/add. All ther-ex performed x 10 reps with min assist. Cues to breathe instead of holding breath. Educated on IS.   Assessment/Plan    PT Assessment Patient needs continued PT services  PT Problem List Decreased strength;Decreased balance;Decreased mobility;Decreased knowledge of use of DME;Pain       PT Treatment Interventions DME instruction;Gait training;Stair training;Therapeutic exercise    PT Goals (Current goals can be found in the Care Plan section)  Acute Rehab PT Goals Patient Stated Goal: to go home PT Goal Formulation: With patient Time For Goal Achievement: 04/22/20 Potential to Achieve Goals: Good    Frequency BID   Barriers to discharge        Co-evaluation               AM-PAC PT "6 Clicks" Mobility  Outcome Measure Help needed turning from your back to your side while in a flat bed without using bedrails?: A Little Help needed moving from lying on your back to sitting on the side of a flat bed without using bedrails?: A Little Help needed moving to and from a bed to a chair (including a wheelchair)?: A Little Help needed standing up from a chair using your arms (e.g., wheelchair or bedside chair)?: A Little Help needed to walk in hospital room?: A Little Help needed climbing 3-5 steps with a railing? : A Lot 6 Click Score: 17    End of Session Equipment Utilized During Treatment: Gait belt Activity Tolerance: Patient tolerated treatment well Patient left: in chair;with chair alarm set Nurse Communication: Mobility status PT Visit Diagnosis: Muscle  weakness (generalized) (M62.81);Difficulty in walking, not elsewhere classified (R26.2);Pain Pain - Right/Left: Right Pain - part of body: Hip    Time: 1513-1550 PT Time Calculation (min) (ACUTE ONLY): 37 min   Charges:   PT Evaluation $PT Eval Low Complexity: 1 Low PT Treatments $Therapeutic Exercise: 23-37 mins        Greggory Stallion, PT, DPT [phone]   Avan Gullett 04/08/2020, 4:52 PM

## 2020-04-08 NOTE — H&P (Signed)
Chief Complaint  Patient presents with  . Pre-op Exam  scheduled for Right total hip arthroplasty    History of the Present Illness: Savannah Wilkins is a 70 y.o. female here today for history and physical for right total hip arthroplasty with Dr. Hessie Knows on 03/26/2020. Patient has had years of progressive right hip pain, x-rays show severe arthritic changes throughout the right hip joint. The patient states she has bilateral hip pain, right worse than left. She states the pain is deep in the groin, and sometimes it radiates down the lateral aspect of her leg. She states she has 3 different kinds of pain. The patient states when she lays on her left side or wears a seatbelt, she has pain. She states she has had 2 injections to her hips with an ultrasound, as well as a bursal injection. She states the only thing that helped was the bursal injection. The patient states she has had physical therapy. The patient states she is overweight, and her blood pressure is slowly creeping up. She has chronic kidney stones, with 21 stones in the last 25 years. She has had her parathyroid checked. The patient states she has had salivary stones in the past. She takes Emgality injections for migraines. She states she has taken aspirin for a pulmonary embolism 5 years ago that started out as a DVT. The patient states she has knee pain and a bad back. She has had a cholecystectomy.  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   I have reviewed past medical, surgical, social and family history, and allergies as documented in the EMR.  Past Medical History: Past Medical History:  Diagnosis Date  . Degenerative joint disease  . History of DVT (deep vein thrombosis)  Right leg popliteal  . History of kidney stones  . History of pulmonary embolism  . Kidney stones  . Lichen sclerosus et atrophicus  . Lumbar spondylosis  . Meralgia  paresthetica, left lower limb  . Osteoarthritis of left knee  Left knee, right hip, and right forefinger  . Osteopenia  . Restless legs syndrome  Hx of restless legs syndrome   Past Surgical History: Past Surgical History:  Procedure Laterality Date  . CATARACT EXTRACTION  . CHOLECYSTECTOMY  . COMBINED HYSTEROSCOPY DIAGNOSTIC / D&C 02/10/2013  . DCR of right eye  . Left eye retinal tear repair  . Multiple kidney stone surgeries  . Right eye retinal tear repair  . Right pinkie finger cyst excision 01/23/2008  . TONSILLECTOMY 1984  . TUBAL LIGATION 1985   Past Family History: Family History  Problem Relation Age of Onset  . Stroke Mother  . Prostate cancer Father  . Heart failure Father  . No Known Problems Sister  . No Known Problems Sister   Medications: Current Outpatient Medications Ordered in Epic  Medication Sig Dispense Refill  . aspirin 81 MG EC tablet Take 81 mg by mouth once daily  . aspirin-acetaminophen-caffeine (EXCEDRIN MIGRAINE) 250-250-65 mg per tablet Take 1 tablet by mouth every 6 (six) hours as needed  . clobetasoL (TEMOVATE) 0.05 % ointment Apply topically as directed Uses 2-3x/weekly 30 g 2  . EMGALITY PEN 120 mg/mL PnIj ADMINISTER 1 ML UNDER THE SKIN EVERY 28 DAYS 1 mL 11  . traMADoL (ULTRAM) 50 mg tablet Take 1 tablet (50 mg total) by mouth every 6 (six) hours as needed 40 tablet 2  .  vit C/E/Zn/coppr/lutein/zeaxan (PRESERVISION AREDS-2 ORAL) Take 1 tablet by mouth 2 (two) times daily   No current Epic-ordered facility-administered medications on file.   Allergies: Allergies  Allergen Reactions  . Ciprofloxacin Other (See Comments)  Caused severe tendonitis in feet  . Levaquin [Levofloxacin] Other (See Comments)  Caused severe tendonitis in feet  . Penicillins Rash  Caused generalized rash  . Quinolones Other (See Comments)  Caused pain & cramping  . Sulfa (Sulfonamide Antibiotics) Rash  Caused generalized rash    Body mass index is 40.6  kg/m.  Review of Systems: A comprehensive 14 point ROS was performed, reviewed, and the pertinent orthopaedic findings are documented in the HPI.  Vitals:  03/26/20 0826  BP: 122/82    General Physical Examination:   General:  Well developed, well nourished, no apparent distress, normal affect, minimal antalgic gait with no assistive devices.  HEENT: Head normocephalic, atraumatic, PERRL.   Abdomen: Soft, non tender, non distended, Bowel sounds present.  Heart: Examination of the heart reveals regular, rate, and rhythm. There is no murmur noted on ascultation. There is a normal apical pulse.  Lungs: Lungs are clear to auscultation. There is no wheeze, rhonchi, or crackles. There is normal expansion of bilateral chest walls.  Musculoskeletal Examination: On exam, right hip has 10 degrees internal rotation and 30 degrees external. Left hip has 20 degrees internal rotation and 30 degrees external. Left knee pain coming from the hip. Good strength with bilateral hip flexion. Right hip bursal tenderness.   Radiographs: AP pelvis and lateral x-rays of the bilateral hips were reviewed by me today from 12/03/2019. Right hip shows severe osteoarthritis with large osteophyte on the lateral aspect of the femoral head that would impinge. There is also medial osteophytes of the femoral head. Lateral view confirms extensive osteophytes around the femoral head, severe central hip osteoarthritis with subchondral cyst formation and subchondral sclerosis on both sides of the joint. Left hip is similar with worse central hip arthritis, as well as large osteophytes superolaterally and medially. Subchondral cyst formation and subchondral sclerosis present on both sides of the joint.  X-ray Impression Bilateral severe hip osteoarthritis. There is also some moderate bilateral sacroiliac arthritis. No acute process.  Assessment: ICD-10-CM  1. Primary osteoarthritis of right hip M16.11  2. Morbid  obesity with BMI of 40.0-44.9, adult (CMS-HCC) E66.01  Z68.41   Plan: 1. Risks, benefits, complications of a right total hip arthroplasty have been discussed with the patient. Patient has agreed and consented the procedure with Dr. Hessie Knows on 03/26/2020. She is scheduled for IVC filter.     Electronically signed by Feliberto Gottron, PA at 03/26/2020 9:19 AM EST   Reviewed  H+P. No changes noted.

## 2020-04-08 NOTE — Transfer of Care (Cosign Needed)
Immediate Anesthesia Transfer of Care Note  Patient: Savannah Wilkins  Procedure(s) Performed: TOTAL HIP ARTHROPLASTY ANTERIOR APPROACH (Right Hip)  Patient Location: PACU  Anesthesia Type:General and Spinal  Level of Consciousness: drowsy  Airway & Oxygen Therapy: Patient Spontanous Breathing and Patient connected to face mask oxygen  Post-op Assessment: Report given to RN and Post -op Vital signs reviewed and stable  Post vital signs: Reviewed and stable  Last Vitals:  Vitals Value Taken Time  BP 141/76 04/08/20 1218  Temp 36.3 C 04/08/20 1218  Pulse 87 04/08/20 1222  Resp 16 04/08/20 1222  SpO2 98 % 04/08/20 1222  Vitals shown include unvalidated device data.  Last Pain:  Vitals:   04/08/20 0826  TempSrc: Oral  PainSc: 7       Patients Stated Pain Goal: 2 (82/95/62 1308)  Complications: No complications documented.

## 2020-04-08 NOTE — Op Note (Signed)
04/08/2020  12:28 PM  PATIENT:  Catalina Gravel  70 y.o. female  PRE-OPERATIVE DIAGNOSIS:  Primary osteoarthritis of right hip M16.11  POST-OPERATIVE DIAGNOSIS:  Primary osteoarthritis of right hip M16.11  PROCEDURE:  Procedure(s): TOTAL HIP ARTHROPLASTY ANTERIOR APPROACH (Right)  SURGEON: Laurene Footman, MD  ASSISTANTS: None  ANESTHESIA:   spinal  EBL:  Total I/O In: 1100 [I.V.:1000; IV Piggyback:100] Out: 100 [Urine:75; Blood:25]  BLOOD ADMINISTERED:none  DRAINS: Incisional wound VAC   LOCAL MEDICATIONS USED:  MARCAINE    and OTHER Exparel  SPECIMEN:  Source of Specimen:  Right femoral head  DISPOSITION OF SPECIMEN:  PATHOLOGY  COUNTS:  YES  TOURNIQUET:  * No tourniquets in log *  IMPLANTS: Medacta Masterloc size 5 lateralized, 54 mm Mpact DM cup and liner with metal S 28 mm head  DICTATION: .Dragon Dictation   The patient was brought to the operating room and after spinal anesthesia was obtained patient was placed on the operative table with the ipsilateral foot into the Medacta attachment, contralateral leg on a well-padded table. C-arm was brought in and preop template x-ray taken. After prepping and draping in usual sterile fashion appropriate patient identification and timeout procedures were completed. Anterior approach to the hip was obtained and centered over the greater trochanter and TFL muscle. The subcutaneous tissue was incised hemostasis being achieved by electrocautery. TFL fascia was incised and the muscle retracted laterally deep retractor placed. The lateral femoral circumflex vessels were identified and ligated. The anterior capsule was exposed and a capsulotomy performed. The neck was identified and a femoral neck cut carried out with a saw. The head was removed without difficulty and showed sclerotic femoral head and acetabulum. Reaming was carried out to 54 mm and a 54 mm cup trial gave appropriate tightness to the acetabular component a 54 DM cup was  impacted into position. The leg was then externally rotated and ischiofemoral and pubofemoral releases carried out. The femur was sequentially broached to a size 5, size 5 lateralized stem with S head trials were placed and the final components chosen. The 5 lateralized stem was inserted along with a metal S 28 mm head and 54 mm liner. The hip was reduced and was stable the wound was thoroughly irrigated with fibrillar placed along the posterior capsule and medial neck. The deep fascia ws closed using a heavy Quill after infiltration of 30 cc of quarter percent Sensorcaine with epinephrine mixed with Exparel throughout the case, .3-0 V-loc to close the skin with skin staples.  Incisional wound VAC applied and patient was sent to recovery in stable condition.   PLAN OF CARE: Admit to inpatient

## 2020-04-08 NOTE — Anesthesia Preprocedure Evaluation (Signed)
Anesthesia Evaluation  Patient identified by MRN, date of birth, ID band Patient awake    Reviewed: Allergy & Precautions, H&P , NPO status , Patient's Chart, lab work & pertinent test results  History of Anesthesia Complications (+) PONV and history of anesthetic complications  Airway Mallampati: III  TM Distance: <3 FB Neck ROM: full    Dental  (+) Chipped   Pulmonary neg shortness of breath, pneumonia,    Pulmonary exam normal        Cardiovascular Exercise Tolerance: Good (-) angina(-) Past MI and (-) DOE negative cardio ROS Normal cardiovascular exam     Neuro/Psych  Headaches, negative psych ROS   GI/Hepatic negative GI ROS, Neg liver ROS, neg GERD  ,  Endo/Other  negative endocrine ROS  Renal/GU Renal disease     Musculoskeletal  (+) Arthritis ,   Abdominal   Peds  Hematology negative hematology ROS (+)   Anesthesia Other Findings Past Medical History: No date: Arthritis 2015: DVT (deep venous thrombosis) (HCC)     Comment:  pulmonary emboli No date: Headache No date: History of kidney stones No date: Lichen sclerosus 1324: Pneumonia No date: PONV (postoperative nausea and vomiting) 2015: Pulmonary emboli (HCC) No date: Restless leg syndrome  Past Surgical History: 1996: CHOLECYSTECTOMY 2015: COLONOSCOPY 2010: CYST EXCISION; Right     Comment:  pinkie finger No date: dcr; Right     Comment:  right eye 2006: [place] LITHOTRIPSY; Bilateral     Comment:  twice on left and once on right 07/25/2018: EXTRACORPOREAL SHOCK WAVE LITHOTRIPSY; Right     Comment:  Procedure: EXTRACORPOREAL SHOCK WAVE LITHOTRIPSY (ESWL);              Surgeon: Billey Co, MD;  Location: [facility] ORS;                Service: Urology;  Laterality: Right; 2013: EYE SURGERY; Bilateral     Comment:  cataract extractions 2015: HYSTEROSCOPY 04/06/2020: IVC FILTER INSERTION; N/A     Comment:  Procedure: IVC  FILTER INSERTION;  Surgeon: Katha Cabal, MD;  Location: [place] [facility];  Service:              Cardiovascular;  Laterality: N/A; 2006, 2013: kidney stone removal; Right     Comment:  right ureteroscopic stone removals No date: [place]; Bilateral     Comment:  RIGHT 2007 AND 2008; LEFT 2012 1984: TONSILLECTOMY 1985: TUBAL LIGATION  BMI    Body Mass Index: 39.61 kg/m      Reproductive/Obstetrics negative OB ROS                             Anesthesia Physical Anesthesia Plan  ASA: III  Anesthesia Plan: Spinal   Post-op Pain Management:    Induction:   PONV Risk Score and Plan:   Airway Management Planned: Natural Airway and Nasal Cannula  Additional Equipment:   Intra-op Plan:   Post-operative Plan:   Informed Consent: I have reviewed the patients History and Physical, chart, labs and discussed the procedure including the risks, benefits and alternatives for the proposed anesthesia with the patient or authorized representative who has indicated his/her understanding and acceptance.     Dental Advisory Given  Plan Discussed with: Anesthesiologist, CRNA and Surgeon  Anesthesia Plan Comments: (Patient reports no bleeding problems and  no anticoagulant use.  Plan for spinal with backup GA  Patient consented for risks of anesthesia including but not limited to:  - adverse reactions to medications - damage to eyes, teeth, lips or other oral mucosa - nerve damage due to positioning  - risk of bleeding, infection and or nerve damage from spinal that could lead to paralysis - risk of headache or failed spinal - damage to teeth, lips or other oral mucosa - sore throat or hoarseness - damage to heart, brain, nerves, lungs, other parts of body or loss of life  Patient voiced understanding.)        Anesthesia Quick Evaluation

## 2020-04-09 LAB — BASIC METABOLIC PANEL
Anion gap: 5 (ref 5–15)
BUN: 14 mg/dL (ref 8–23)
CO2: 24 mmol/L (ref 22–32)
Calcium: 7.9 mg/dL — ABNORMAL LOW (ref 8.9–10.3)
Chloride: 107 mmol/L (ref 98–111)
Creatinine, Ser: 0.79 mg/dL (ref 0.44–1.00)
GFR, Estimated: >60 mL/min (ref >60)
Glucose, Bld: 116 mg/dL — ABNORMAL HIGH (ref 70–99)
Potassium: 3.7 mmol/L (ref 3.5–5.1)
Sodium: 136 mmol/L (ref 135–145)

## 2020-04-09 LAB — CBC
HCT: 34.2 % — ABNORMAL LOW (ref 36.0–46.0)
Hemoglobin: 11.3 g/dL — ABNORMAL LOW (ref 12.0–15.0)
MCH: 29.7 pg (ref 26.0–34.0)
MCHC: 33 g/dL (ref 30.0–36.0)
MCV: 90 fL (ref 80.0–100.0)
Platelets: 187 10*3/uL (ref 150–400)
RBC: 3.8 MIL/uL — ABNORMAL LOW (ref 3.87–5.11)
RDW: 13.8 % (ref 11.5–15.5)
WBC: 8.3 10*3/uL (ref 4.0–10.5)
nRBC: 0 % (ref 0.0–0.2)

## 2020-04-09 MED ORDER — HYDROCODONE-ACETAMINOPHEN 5-325 MG PO TABS: 1.0000 | ORAL_TABLET | ORAL | 0 refills | Status: DC | PRN

## 2020-04-09 MED ORDER — TRAMADOL HCL 50 MG PO TABS: 50.0000 mg | ORAL_TABLET | Freq: Four times a day (QID) | ORAL | 0 refills | Status: DC | PRN

## 2020-04-09 MED ORDER — ENOXAPARIN SODIUM 40 MG/0.4ML ~~LOC~~ SOLN
40.0000 mg | SUBCUTANEOUS | 0 refills | Status: DC
Start: 2020-04-09 — End: 2020-04-09

## 2020-04-09 MED ORDER — APIXABAN 5 MG PO TABS: 5.0000 mg | ORAL_TABLET | Freq: Two times a day (BID) | ORAL | 2 refills | Status: DC

## 2020-04-09 MED ORDER — ENOXAPARIN SODIUM 60 MG/0.6ML ~~LOC~~ SOLN: 0.5000 mg/kg | SUBCUTANEOUS | Status: DC

## 2020-04-09 MED ORDER — APIXABAN 5 MG PO TABS
5.0000 mg | ORAL_TABLET | Freq: Two times a day (BID) | ORAL | Status: DC
  Administered 2020-04-10: 5 mg via ORAL
  Filled 2020-04-09: qty 1

## 2020-04-09 MED ORDER — METHOCARBAMOL 500 MG PO TABS: 500.0000 mg | ORAL_TABLET | Freq: Four times a day (QID) | ORAL | 0 refills | Status: DC | PRN

## 2020-04-09 NOTE — Evaluation (Signed)
Occupational Therapy Evaluation Patient Details Name: Savannah Wilkins MRN: [id number] DOB: Feb 09, 1950 Today's Date: 04/09/2020    History of Present Illness Pt admitted for R THR. History of restless legs and PE.   Clinical Impression   Savannah Wilkins was seen for OT evaluation this date. Prior to hospital admission, pt was MOD I for mobility and ADLs using SPC. Pt lives with family in home with ramped entrance. Pt presents to acute OT demonstrating impaired ADL performance and functional mobility 2/2 decreased activity tolerance, functional strength/ROM/balance deficits. Pt currently requires MOD A for LBD seated EOB. SBA + RW for standing grooming tasks - of note pt body habitus does not fit inside regular RW, re-attempted with Savannah Wilkins RW and pt with notably increased safety. CGA + RW for ADL t/f.   Pt would benefit from skilled OT to address noted impairments and functional limitations (see below for any additional details) in order to maximize safety and independence while minimizing falls risk and caregiver burden. Upon hospital discharge, recommend HHOT to maximize pt safety and return to functional independence during meaningful occupations of daily life.  Of note, pt is unsafe to mobilize using a standard rolling walker. Despite her weight, patient needs bariatric rolling walker 2/2 her body habitus. A bariatric RW would allow pt to safely stand inside walker without shearing forces that could create skin tears.     Follow Up Recommendations  Home health OT;Supervision/Assistance - 24 hour    Equipment Recommendations  Other (comment) (bariatric rolling walker)    Recommendations for Other Services       Precautions / Restrictions Precautions Precautions: Anterior Hip;Fall Precaution Booklet Issued: Yes (comment) Restrictions Weight Bearing Restrictions: Yes RLE Weight Bearing: Weight bearing as tolerated      Mobility Bed Mobility Overal bed mobility: Needs Assistance Bed Mobility:  Supine to Sit;Sit to Supine     Supine to sit: Min guard Sit to supine: Min assist   General bed mobility comments: Assist given to lift R LE up onto bed    Transfers Overall transfer level: Needs assistance Equipment used: Rolling walker (2 wheeled) Transfers: Sit to/from Stand Sit to Stand: Min guard         General transfer comment: increased time and effort    Balance Overall balance assessment: Needs assistance Sitting-balance support: Feet supported Sitting balance-Leahy Scale: Good                                     ADL either performed or assessed with clinical judgement   ADL Overall ADL's : Needs assistance/impaired                                       General ADL Comments: MOD A for LBD seated EOB. SBA + RW for standing grooming tasks - of note pt body habitus does not fit inside regular RW, re-attempted with Savannah Wilkins RW and pt notably increased safety. CGA + RW for ADL t/f                  Pertinent Vitals/Pain Pain Assessment: Faces Pain Score: 4  Faces Pain Scale: Hurts little more Pain Location: R hip Pain Descriptors / Indicators: Operative site guarding Pain Intervention(s): Limited activity within patient's tolerance     Hand Dominance     Extremity/Trunk Assessment Upper Extremity Assessment Upper  Extremity Assessment: Generalized weakness   Lower Extremity Assessment Lower Extremity Assessment: Generalized weakness       Communication Communication Communication: No difficulties   Cognition Arousal/Alertness: Awake/alert Behavior During Therapy: WFL for tasks assessed/performed Overall Cognitive Status: Within Functional Limits for tasks assessed                                     General Comments  wound vac intact    Exercises Exercises: Other exercises Other Exercises Other Exercises: Pt educated re: OT role, DME recs, d/c recs, falls prevention, ECS Other Exercises: LBD,  grooming, sup<>sit, sit<>stand, sitting/standing balance/tolerance   Shoulder Instructions      Home Living Family/patient expects to be discharged to:: Private residence Living Arrangements: Spouse/significant other Available Help at Discharge: Family Type of Home: House Home Access: Stairs to enter;Ramped entrance Entrance Stairs-Number of Steps: 1 Entrance Stairs-Rails: None Home Layout: One level               Home Equipment: Cane - single point;Grab bars - toilet   Additional Comments: Has ramp in front enterance and 1 step to enter through garage. Has handles on toilet, but not an elevated seat. Reports she is ok with transfers on/off toliet using the handles      Prior Functioning/Environment Level of Independence: Independent with assistive device(s)        Comments: using SPC at baseline        OT Problem List: Decreased strength;Decreased range of motion;Decreased activity tolerance;Decreased knowledge of use of DME or AE      OT Treatment/Interventions: Self-care/ADL training;Therapeutic exercise;DME and/or AE instruction;Energy conservation;Therapeutic activities;Patient/family education;Balance training    OT Goals(Current goals can be found in the care plan section) Acute Rehab OT Goals Patient Stated Goal: to go home OT Goal Formulation: With patient Time For Goal Achievement: 04/23/20 Potential to Achieve Goals: Good ADL Goals Pt Will Perform Grooming: with modified independence;standing (c LRAD PRN) Pt Will Perform Lower Body Dressing: sit to/from stand;with min guard assist (c LRAD PRN) Pt Will Transfer to Toilet: with modified independence;ambulating;regular height toilet (c LRAD PRN)  OT Frequency: Min 1X/week    AM-PAC OT "6 Clicks" Daily Activity     Outcome Measure Help from another person eating meals?: None Help from another person taking care of personal grooming?: A Little Help from another person toileting, which includes using  toliet, bedpan, or urinal?: A Little Help from another person bathing (including washing, rinsing, drying)?: A Little Help from another person to put on and taking off regular upper body clothing?: None Help from another person to put on and taking off regular lower body clothing?: A Lot 6 Click Score: 19   End of Session Equipment Utilized During Treatment: Rolling walker  Activity Tolerance: Patient tolerated treatment well Patient left: in bed;with call bell/phone within reach;with bed alarm set  OT Visit Diagnosis: Unsteadiness on feet (R26.81);Other abnormalities of gait and mobility (R26.89)                Time: 1610-9604 OT Time Calculation (min): 36 min Charges:  OT General Charges $OT Visit: 1 Visit OT Evaluation $OT Eval Low Complexity: 1 Low OT Treatments $Self Care/Home Management : 23-37 mins  Dessie Coma, M.S. OTR/L  04/09/20, 4:50 PM  ascom [phone]

## 2020-04-09 NOTE — Progress Notes (Signed)
PHARMACIST - PHYSICIAN COMMUNICATION  CONCERNING:  Enoxaparin (Lovenox) for DVT Prophylaxis    RECOMMENDATION: Patient was prescribed enoxaprin 40mg  q24 hours for VTE prophylaxis.   Filed Weights   04/08/20 0826  Weight: 108 kg (238 lb)    Body mass index is 39.61 kg/m.  Estimated Creatinine Clearance: 80 mL/min (by C-G formula based on SCr of 0.79 mg/dL).   Based on [place] patient is candidate for enoxaparin 0.5mg /kg TBW SQ every 24 hours based on BMI being >30.  DESCRIPTION: Pharmacy has adjusted enoxaparin dose per [facility] policy.  Patient is now receiving 55 mg enoxaparin  every 24 hours    Pearla Dubonnet, PharmD Clinical Pharmacist  04/09/2020 9:04 AM

## 2020-04-09 NOTE — Progress Notes (Signed)
   Subjective: 1 Day Post-Op Procedure(s) (LRB): TOTAL HIP ARTHROPLASTY ANTERIOR APPROACH (Right) Patient reports pain as mild.   Patient is well, and has had no acute complaints or problems Denies any CP, SOB, ABD pain. We will continue therapy today.  Plan is to go Home after hospital stay.  Objective: Vital signs in last 24 hours: Temp:  [96.4 F (35.8 C)-98.9 F (37.2 C)] 98.5 F (36.9 C) (03/25 0524) Pulse Rate:  [64-90] 79 (03/25 0524) Resp:  [10-25] 18 (03/25 0524) BP: (101-179)/(69-89) 101/69 (03/25 0524) SpO2:  [91 %-99 %] 95 % (03/25 0524) Weight:  [809 kg] 108 kg (03/24 0826)  Intake/Output from previous day: 03/24 0701 - 03/25 0700 In: 3583.2 [P.O.:560; I.V.:2819.9; IV Piggyback:203.3] Out: 550 [Urine:525; Blood:25] Intake/Output this shift: No intake/output data recorded.  Recent Labs    04/08/20 0907 04/09/20 0505  HGB 13.6 11.3*   Recent Labs    04/08/20 0907 04/09/20 0505  WBC 8.1 8.3  RBC 4.68 3.80*  HCT 41.1 34.2*  PLT 217 187   Recent Labs    04/08/20 0907 04/09/20 0505  NA 141 136  K 3.5 3.7  CL 111 107  CO2 21* 24  BUN 16 14  CREATININE 0.56 0.79  GLUCOSE 91 116*  CALCIUM 9.0 7.9*   No results for input(s): LABPT, INR in the last 72 hours.  EXAM General - Patient is Alert, Appropriate and Oriented Extremity - Neurovascular intact Sensation intact distally Intact pulses distally No cellulitis present Compartment soft Dressing - dressing C/D/I and no drainage , prevena intact with 50 cc draiange Motor Function - intact, moving foot and toes well on exam.   Past Medical History:  Diagnosis Date  . Arthritis   . DVT (deep venous thrombosis) ([place]) 2015   pulmonary emboli  . Headache   . History of kidney stones   . Lichen sclerosus   . Pneumonia 2015  . PONV (postoperative nausea and vomiting)   . Pulmonary emboli ([place]) 2015  . Restless leg syndrome     Assessment/Plan:   1 Day Post-Op Procedure(s) (LRB): TOTAL HIP  ARTHROPLASTY ANTERIOR APPROACH (Right) Active Problems:   S/P hip replacement  Estimated body mass index is 39.61 kg/m as calculated from the following:   Height as of this encounter: 5\' 5"  (1.651 m).   Weight as of this encounter: 108 kg. Advance diet Up with therapy  Work on [employer] and VSS Pain well controlled CM to assist with discharge to home with HHPT  DVT Prophylaxis - Lovenox, TED hose and SCDs Weight-Bearing as tolerated to right leg   T. Rachelle Hora, PA-C [place] 04/09/2020, 7:19 AM

## 2020-04-09 NOTE — Discharge Summary (Signed)
Physician Discharge Summary  Patient ID: Savannah Wilkins MRN: [id number] DOB/AGE: 1950-01-27 70 y.o.  Admit date: 04/08/2020 Discharge date: April 10, 2020 Admission Diagnoses:  S/P hip replacement [Z96.649]   Discharge Diagnoses: Patient Active Problem List   Diagnosis Date Noted  . S/P hip replacement 04/08/2020  . Restless legs 07/02/2018  . Postmenopausal bleeding 07/02/2018  . Lesion of vulva 07/02/2018  . Nephrolithiasis 07/02/2018  . Cataract 07/02/2018  . Medicare annual wellness visit, initial 11/09/2017  . Headache disorder 08/26/2017  . Obesity (BMI 35.0-39.9 without comorbidity) 07/09/2017  . Lichen sclerosus 26/94/8546  . History of pulmonary embolus (PE) 07/09/2017  . Chronic daily headache 07/09/2017  . DVT of leg (deep venous thrombosis) ([place]) 08/17/2013  . Hypoxia 08/16/2013  . Pleuritic chest pain 08/15/2013  . Acute pulmonary embolism ([place]) 08/15/2013  . Arthropathy of pelvic region and thigh 07/03/2012  . Arthritis, hip 07/03/2012    Past Medical History:  Diagnosis Date  . Arthritis   . DVT (deep venous thrombosis) ([place]) 2015   pulmonary emboli  . Headache   . History of kidney stones   . Lichen sclerosus   . Pneumonia 2015  . PONV (postoperative nausea and vomiting)   . Pulmonary emboli ([place]) 2015  . Restless leg syndrome      Transfusion: none   Consultants (if any):   Discharged Condition: Improved  Hospital Course: Savannah Wilkins is an 70 y.o. female who was admitted 04/08/2020 with a diagnosis of right hip osteoarthritis and went to the operating room on 04/08/2020 and underwent the above named procedures.    Surgeries: Procedure(s): TOTAL HIP ARTHROPLASTY ANTERIOR APPROACH on 04/08/2020 Patient tolerated the surgery well. Taken to PACU where she was stabilized and then transferred to the orthopedic floor.  Started on Lovenox. SCDs applied bilaterally at 80 mm. Heels elevated on bed with rolled towels. No evidence of DVT. Negative  [place]. Physical therapy started on day #1 for gait training and transfer. OT started day #1 for ADL and assisted devices.  Patient's foley was d/c on day #1.  The patient ambulated 120 feet with physical therapy.  On post op day #2 patient was stable and ready for discharge to home with HHPT.  Implants: Medacta Masterloc size 5 lateralized, 54 mm Mpact DM cup and liner with metal S 28 mm head  She was given perioperative antibiotics:  Anti-infectives (From admission, onward)   Start     Dose/Rate Route Frequency Ordered Stop   04/08/20 1430  ceFAZolin (ANCEF) IVPB 2g/100 mL premix        2 g 200 mL/hr over 30 Minutes Intravenous Every 6 hours 04/08/20 1325 04/08/20 2130   04/08/20 0845  ceFAZolin (ANCEF) IVPB 2g/100 mL premix        2 g 200 mL/hr over 30 Minutes Intravenous On call to O.R. 04/08/20 0839 04/08/20 1107   04/08/20 0845  ceFAZolin (ANCEF) IVPB 2g/100 mL premix  Status:  Discontinued        2 g 200 mL/hr over 30 Minutes Intravenous  Once 04/08/20 0840 04/08/20 1516   04/08/20 0845  ceFAZolin (ANCEF) 2-4 GM/100ML-% IVPB       Note to Pharmacy: Sylvester Harder   : cabinet override      04/08/20 0845 04/08/20 1043    .  She was given sequential compression devices, early ambulation, and Eliquis for DVT prophylaxis.  She benefited maximally from the hospital stay and there were no complications.    Recent vital signs:  Vitals:  04/10/20 0030 04/10/20 0441  BP: 114/64 131/80  Pulse: (!) 56 (!) 109  Resp: 16 16  Temp: 98.4 F (36.9 C) 97.8 F (36.6 C)  SpO2: 90% 94%    Recent laboratory studies:  Lab Results  Component Value Date   HGB 10.5 (L) 04/10/2020   HGB 11.3 (L) 04/09/2020   HGB 13.6 04/08/2020   Lab Results  Component Value Date   WBC 8.2 04/10/2020   PLT 169 04/10/2020   No results found for: INR Lab Results  Component Value Date   NA 136 04/09/2020   K 3.7 04/09/2020   CL 107 04/09/2020   CO2 24 04/09/2020   BUN 14 04/09/2020   CREATININE  0.79 04/09/2020   GLUCOSE 116 (H) 04/09/2020    Discharge Medications:   Allergies as of 04/10/2020      Reactions   Ciprofloxacin    tendonitis   Penicillins Rash   Tolerated 2g Ancef   Quinolones    Caused pain & cramping Caused severe tendonitis in feet   Sulfa Antibiotics Rash      Medication List    STOP taking these medications   aspirin EC 81 MG tablet   aspirin-acetaminophen-caffeine 250-250-65 MG tablet Commonly known as: EXCEDRIN MIGRAINE     TAKE these medications   apixaban 5 MG Tabs tablet Commonly known as: ELIQUIS Take 1 tablet (5 mg total) by mouth 2 (two) times daily.   clobetasol ointment 0.05 % Commonly known as: TEMOVATE Apply 1 application topically daily as needed (lichen sclerosus).   Emgality 120 MG/ML Soaj Generic drug: Galcanezumab-gnlm Inject 120 mg as directed every 30 (thirty) days.   HYDROcodone-acetaminophen 5-325 MG tablet Commonly known as: NORCO/VICODIN Take 1-2 tablets by mouth every 4 (four) hours as needed for moderate pain (pain score 4-6).   methocarbamol 500 MG tablet Commonly known as: ROBAXIN Take 1 tablet (500 mg total) by mouth every 6 (six) hours as needed for muscle spasms.   PRESERVISION AREDS 2 PO Take 1 capsule by mouth 2 (two) times daily.   REFRESH OPTIVE OP Place 1 drop into both eyes daily as needed (dry eyes).   traMADol 50 MG tablet Commonly known as: ULTRAM Take 1 tablet (50 mg total) by mouth every 6 (six) hours as needed.            Durable Medical Equipment  (From admission, onward)         Start     Ordered   04/08/20 1325  DME Walker rolling  Once       Question Answer Comment  Walker: With 5 Inch Wheels   Patient needs a walker to treat with the following condition S/P hip replacement      04/08/20 1325   04/08/20 1325  DME 3 n 1  Once        04/08/20 1325   04/08/20 1325  DME Bedside commode  Once       Question:  Patient needs a bedside commode to treat with the following  condition  Answer:  S/P hip replacement   04/08/20 1325          Diagnostic Studies: PERIPHERAL VASCULAR CATHETERIZATION  Result Date: 04/06/2020 See op note  DG HIP OPERATIVE UNILAT W OR W/O PELVIS RIGHT  Result Date: 04/08/2020 CLINICAL DATA:  Right total hip replacement EXAM: OPERATIVE right HIP (WITH PELVIS IF PERFORMED) 1 VIEWS TECHNIQUE: Fluoroscopic spot image(s) were submitted for interpretation post-operatively. Radiation exposure index: 9.4 mGy. COMPARISON:  None. FINDINGS:  Single intraoperative image of the right hip was obtained. The right acetabular and femoral components appear to be well situated. IMPRESSION: Status post right total hip arthroplasty. Electronically Signed   By: Marijo Conception M.D.   On: 04/08/2020 13:46   DG HIP UNILAT W OR W/O PELVIS 2-3 VIEWS RIGHT  Result Date: 04/08/2020 CLINICAL DATA:  Status post right hip replacement. EXAM: DG HIP (WITH OR WITHOUT PELVIS) 2-3V RIGHT COMPARISON:  None. FINDINGS: The right acetabular and femoral components are well situated. Expected postoperative changes are seen in the surrounding soft tissues. IMPRESSION: Status post right total hip arthroplasty. Electronically Signed   By: Marijo Conception M.D.   On: 04/08/2020 13:37    Disposition:  Discharge disposition: 01-Home or [place], [place] Follow up.   Specialty: Home Health Services Why: Previously called [facility]. They will follow up with you for your home health needs. Contact information: [place] [address] [phone]        Duanne Guess, PA-C Follow up in 2 week(s).   Specialties: Orthopedic Surgery, Emergency Medicine Contact information: [place] [address] [phone]                Signed: Prescott Parma, Sherren Mocha 04/10/2020, 7:06 AM

## 2020-04-09 NOTE — Progress Notes (Signed)
Physical Therapy Treatment Patient Details Name: Savannah Wilkins MRN: [id number] DOB: 12-04-1950 Today's Date: 04/09/2020    History of Present Illness Pt admitted for R THR. History of restless legs and PE.    PT Comments    Complaints of feeling stiff this am with R hip pain at 8/10, pain meds received 3 hours ago.  Pt agreed to PT requiring MinA to maneuver R LE to EOB. Transfers from bed and commode with Min/CGA. Gait training with RW 49ft x 2 with CGA, improved R swing though upon completion. Pt left sitting up in chair with call light, phone, and chair alarm on.    Follow Up Recommendations  Home health PT;Supervision/Assistance - 24 hour     Equipment Recommendations  Rolling walker with 5" wheels    Recommendations for Other Services       Precautions / Restrictions Precautions Precautions: Anterior Hip;Fall Precaution Booklet Issued: Yes (comment) Restrictions RLE Weight Bearing: Weight bearing as tolerated    Mobility  Bed Mobility Overal bed mobility: Needs Assistance Bed Mobility: Supine to Sit     Supine to sit: Min assist     General bed mobility comments: Safe technique with ability to follow commands well.    Transfers Overall transfer level: Needs assistance Equipment used: Rolling walker (2 wheeled) Transfers: Sit to/from Stand Sit to Stand: Min assist         General transfer comment: cues for pushing from seated surface. RW used  Ambulation/Gait Ambulation/Gait assistance: Counsellor (Feet): 70 Feet Assistive device: Rolling walker (2 wheeled) Gait Pattern/deviations: Step-to pattern     General Gait Details:  (initial difficulty with swing through on R, resolved after ambulating a few feet)   Stairs             Wheelchair Mobility    Modified Rankin (Stroke Patients Only)       Balance Overall balance assessment: Needs assistance Sitting-balance support: Feet supported Sitting balance-Leahy Scale: Good                                       Cognition Arousal/Alertness: Awake/alert Behavior During Therapy: WFL for tasks assessed/performed Overall Cognitive Status: Within Functional Limits for tasks assessed                                        Exercises Other Exercises Other Exercises: seated ther-ex performed on R LE including AP, quad sets, glut sets, and hip abd/add. All ther-ex performed x 10 reps with min assist. Cues to breathe instead of holding breath. Educated on IS.    General Comments General comments (skin integrity, edema, etc.): Wound vac and dressing intact      Pertinent Vitals/Pain Pain Assessment: 0-10 Pain Score: 8  Pain Location: R hip Pain Descriptors / Indicators: Operative site guarding Pain Intervention(s): Monitored during session (Pain meds last given 3 hours ago.)    Home Living                      Prior Function            PT Goals (current goals can now be found in the care plan section) Acute Rehab PT Goals Patient Stated Goal: to go home Progress towards PT goals: Progressing toward goals    Frequency  BID      PT Plan      Co-evaluation              AM-PAC PT "6 Clicks" Mobility   Outcome Measure  Help needed turning from your back to your side while in a flat bed without using bedrails?: A Little Help needed moving from lying on your back to sitting on the side of a flat bed without using bedrails?: A Little Help needed moving to and from a bed to a chair (including a wheelchair)?: A Little Help needed standing up from a chair using your arms (e.g., wheelchair or bedside chair)?: A Little Help needed to walk in hospital room?: A Little Help needed climbing 3-5 steps with a railing? : A Lot 6 Click Score: 17    End of Session Equipment Utilized During Treatment: Gait belt Activity Tolerance: Patient tolerated treatment well Patient left: in chair;with chair alarm set Nurse  Communication: Mobility status PT Visit Diagnosis: Muscle weakness (generalized) (M62.81);Difficulty in walking, not elsewhere classified (R26.2);Pain Pain - Right/Left: Right Pain - part of body: Hip     Time: 9597-4718 PT Time Calculation (min) (ACUTE ONLY): 50 min  Charges:  $Gait Training: 23-37 mins $Therapeutic Activity: 8-22 mins                     [name], PTA   [name] 04/09/2020, 1:46 PM

## 2020-04-09 NOTE — Progress Notes (Signed)
Physical Therapy Treatment Patient Details Name: Savannah Wilkins MRN: [id number] DOB: 03-30-1950 Today's Date: 04/09/2020    History of Present Illness Pt admitted for R THR. History of restless legs and PE.    PT Comments    Pt sat up in chair for three hours today, able to raise to standing with CGA.  Improved tolerance for weight acceptance through R LE as well as swing through during gait.  Distance tolerance increase to 118ft with RW and CG/SBA.  Pt with increased confidence in mobility this pm and pleased with functional progression.  Pt encouraged to sit EOB for dinner. Reviewed LE exercises. R hip pain down to 4/10 this pm.  Pt awaiting possible d/c home with HHPT services.   Follow Up Recommendations  Home health PT;Supervision/Assistance - 24 hour     Equipment Recommendations  Rolling walker with 5" wheels    Recommendations for Other Services       Precautions / Restrictions Precautions Precautions: Anterior Hip;Fall Precaution Booklet Issued: Yes (comment) Restrictions RLE Weight Bearing: Weight bearing as tolerated    Mobility  Bed Mobility Overal bed mobility: Needs Assistance Bed Mobility: Sit to Supine     Supine to sit: Min assist     General bed mobility comments: Assist given to lift R LE up onto bed    Transfers Overall transfer level: Needs assistance Equipment used: Rolling walker (2 wheeled) Transfers: Sit to/from Stand Sit to Stand: Min guard;Supervision         General transfer comment:  (Pt demonstrated good transfer technique to/from commode with use of B UE's)  Ambulation/Gait Ambulation/Gait assistance: Supervision Gait Distance (Feet): 120 Feet Assistive device: Rolling walker (2 wheeled) Gait Pattern/deviations: Step-to pattern     General Gait Details: Improved from morning session   Stairs             Wheelchair Mobility    Modified Rankin (Stroke Patients Only)       Balance Overall balance assessment:  Needs assistance Sitting-balance support: Feet supported Sitting balance-Leahy Scale: Good                                      Cognition Arousal/Alertness: Awake/alert Behavior During Therapy: WFL for tasks assessed/performed Overall Cognitive Status: Within Functional Limits for tasks assessed                                        Exercises Other Exercises Other Exercises:  (AP, R heel slides x 10 AA/AROM)    General Comments General comments (skin integrity, edema, etc.): Wound vac intact      Pertinent Vitals/Pain Pain Assessment: 0-10 Pain Score: 4  Pain Location: R hip Pain Descriptors / Indicators: Operative site guarding Pain Intervention(s): Monitored during session (Pain meds given one hour ago)    Home Living                      Prior Function            PT Goals (current goals can now be found in the care plan section) Acute Rehab PT Goals Patient Stated Goal: to go home Progress towards PT goals: Progressing toward goals    Frequency    BID      PT Plan Current plan remains appropriate    Co-evaluation  AM-PAC PT "6 Clicks" Mobility   Outcome Measure  Help needed turning from your back to your side while in a flat bed without using bedrails?: A Little Help needed moving from lying on your back to sitting on the side of a flat bed without using bedrails?: A Little Help needed moving to and from a bed to a chair (including a wheelchair)?: A Little Help needed standing up from a chair using your arms (e.g., wheelchair or bedside chair)?: A Little Help needed to walk in hospital room?: A Little Help needed climbing 3-5 steps with a railing? : A Lot 6 Click Score: 17    End of Session Equipment Utilized During Treatment: Gait belt Activity Tolerance: Patient tolerated treatment well Patient left: in bed;with call bell/phone within reach;with SCD's reapplied Nurse Communication:  Mobility status PT Visit Diagnosis: Muscle weakness (generalized) (M62.81);Difficulty in walking, not elsewhere classified (R26.2);Pain Pain - Right/Left: Right Pain - part of body: Hip     Time: 1300-1350 PT Time Calculation (min) (ACUTE ONLY): 50 min  Charges:  $Gait Training: 8-22 mins $Therapeutic Exercise: 8-22 mins $Therapeutic Activity: 8-22 mins                    [name], PTA   [name] 04/09/2020, 1:59 PM

## 2020-04-09 NOTE — Discharge Instructions (Signed)

## 2020-04-09 NOTE — TOC Initial Note (Signed)
Transition of Care [facility]) - Initial/Assessment Note    Patient Details  Name: Savannah Wilkins MRN: [id number] Date of Birth: 09/01/1950  [facility] of [facility]) CM/SW Contact:    Candie Chroman, LCSW Phone Number: 04/09/2020, 10:51 AM  Clinical Narrative:  CSW met with patient. No supports at bedside. CSW introduced role and explained that PT recommendations would be discussed. Patient is agreeable to home health. Patient was set up with [facility] prior to admission and she is agreeable to this. She has a cane, shower seat, and bars above toilet at home. She is agreeable to DME recommendation for rolling walker. Ordered through [employer]. Patient declined 3-in-1. Patient stated she should be going home this weekend. Left message for [facility] representative to notify. No further concerns. CSW encouraged patient to contact CSW as needed. CSW will continue to follow patient for support and facilitate return home when stable.                Expected Discharge Plan: [place] Barriers to Discharge: Continued Medical Work up   Patient Goals and CMS Choice     Choice offered to / list presented to : Patient  Expected Discharge Plan and Services Expected Discharge Plan: [place] Acute Care Choice: [place] Living arrangements for the past 2 months: Single Family Home Expected Discharge Date: 04/10/20                                    Prior Living Arrangements/Services Living arrangements for the past 2 months: Single Family Home Lives with:: Spouse Patient language and need for interpreter reviewed:: Yes Do you feel safe going back to the place where you live?: Yes      Need for Family Participation in Patient Care: Yes (Comment) Care giver support system in place?: Yes (comment)   Criminal Activity/Legal Involvement Pertinent to Current Situation/Hospitalization: No - Comment as needed  Activities of Daily  Living Home Assistive Devices/Equipment: Cane (specify quad or straight),Eyeglasses (straight cane) ADL Screening (condition at time of admission) Patient's cognitive ability adequate to safely complete daily activities?: Yes Is the patient deaf or have difficulty hearing?: No Does the patient have difficulty seeing, even when wearing glasses/contacts?: Yes Does the patient have difficulty concentrating, remembering, or making decisions?: No Patient able to express need for assistance with ADLs?: Yes Does the patient have difficulty dressing or bathing?: No Independently performs ADLs?: Yes (appropriate for developmental age) Does the patient have difficulty walking or climbing stairs?: Yes Weakness of Legs: None Weakness of Arms/Hands: None  Permission Sought/Granted Permission sought to share information with : Facility Art therapist granted to share information with : Yes, Verbal Permission Granted     Permission granted to share info w AGENCY: [facility]        Emotional Assessment Appearance:: Appears stated age Attitude/Demeanor/Rapport: Engaged,Gracious Affect (typically observed): Accepting,Appropriate,Calm,Pleasant Orientation: : Oriented to Self,Oriented to Place,Oriented to  Time,Oriented to Situation Alcohol / Substance Use: Not Applicable Psych Involvement: No (comment)  Admission diagnosis:  S/P hip replacement [Z96.649] Patient Active Problem List   Diagnosis Date Noted  . S/P hip replacement 04/08/2020  . Restless legs 07/02/2018  . Postmenopausal bleeding 07/02/2018  . Lesion of vulva 07/02/2018  . Nephrolithiasis 07/02/2018  . Cataract 07/02/2018  . Medicare annual wellness visit, initial 11/09/2017  . Headache disorder 08/26/2017  . Obesity (  BMI 35.0-39.9 without comorbidity) 07/09/2017  . Lichen sclerosus 62/56/3893  . History of pulmonary embolus (PE) 07/09/2017  . Chronic daily headache 07/09/2017  . DVT of leg (deep  venous thrombosis) ([place]) 08/17/2013  . Hypoxia 08/16/2013  . Pleuritic chest pain 08/15/2013  . Acute pulmonary embolism ([place]) 08/15/2013  . Arthropathy of pelvic region and thigh 07/03/2012  . Arthritis, hip 07/03/2012   PCP:  Dion Body, MD Pharmacy:   [facility] [phone] - [address], [address] AT [address] [place] [address] Phone: [phone] Fax: [phone]     Social Determinants of Health (SDOH) Interventions    Readmission Risk Interventions No flowsheet data found.

## 2020-04-10 LAB — CBC
HCT: 32.1 % — ABNORMAL LOW (ref 36.0–46.0)
Hemoglobin: 10.5 g/dL — ABNORMAL LOW (ref 12.0–15.0)
MCH: 29.8 pg (ref 26.0–34.0)
MCHC: 32.7 g/dL (ref 30.0–36.0)
MCV: 91.2 fL (ref 80.0–100.0)
Platelets: 169 10*3/uL (ref 150–400)
RBC: 3.52 MIL/uL — ABNORMAL LOW (ref 3.87–5.11)
RDW: 13.8 % (ref 11.5–15.5)
WBC: 8.2 10*3/uL (ref 4.0–10.5)
nRBC: 0 % (ref 0.0–0.2)

## 2020-04-10 NOTE — Progress Notes (Signed)
Physical Therapy Treatment Patient Details Name: Savannah Wilkins MRN: [id number] DOB: 1950-02-22 Today's Date: 04/10/2020    History of Present Illness 70 y/o female s/p R total hip (anterior approach) 3/24.    PT Comments    Pt continues to make steady gains with PT.  Today she was able to circumambulate the nurses' station and had increased speed and consistency with cadence.  She did fatigue with this effort (vitals stable) and deferred trying to do offered stair training.  She had expected pain with hip flexion tasks, but generally did well and showed ability to manage in home distances and mobility safely.     Follow Up Recommendations  Home health PT;Supervision/Assistance - 24 hour     Equipment Recommendations  Rolling walker with 5" wheels    Recommendations for Other Services       Precautions / Restrictions Precautions Precautions: Anterior Hip;Fall Precaution Booklet Issued: Yes (comment) Restrictions Weight Bearing Restrictions: Yes RLE Weight Bearing: Weight bearing as tolerated    Mobility  Bed Mobility Overal bed mobility: Needs Assistance Bed Mobility: Supine to Sit     Supine to sit: Min guard     General bed mobility comments: Pt able to get to EOB w/o direct assist, still needing heavy rail use    Transfers Overall transfer level: Modified independent Equipment used: Rolling walker (2 wheeled) Transfers: Sit to/from Stand Sit to Stand: Supervision         General transfer comment: still needing general encouragement and cues for set up/sequencing but able to rise w/o assist  Ambulation/Gait Ambulation/Gait assistance: Supervision Gait Distance (Feet): 200 Feet Assistive device: Rolling walker (2 wheeled)       General Gait Details: Pt initially with very slow, guarded, stop-go cadence.  However with increased time and cues she was able to increase consistency of cadence and speed and overall did quite well.  She was quite fatigued with the  effort.   Stairs             Wheelchair Mobility    Modified Rankin (Stroke Patients Only)       Balance     Sitting balance-Leahy Scale: Good       Standing balance-Leahy Scale: Good Standing balance comment: reliant on walker but no LOBs or overt safety issues                            Cognition Arousal/Alertness: Awake/alert Behavior During Therapy: WFL for tasks assessed/performed Overall Cognitive Status: Within Functional Limits for tasks assessed                                        Exercises Total Joint Exercises Ankle Circles/Pumps: AROM;10 reps Quad Sets: Strengthening;15 reps Short Arc Quad: AROM;10 reps Heel Slides: 10 reps;Strengthening Hip ABduction/ADduction: AROM;10 reps    General Comments        Pertinent Vitals/Pain Pain Assessment: 0-10 Pain Score: 7  Pain Location: R hip    Home Living                      Prior Function            PT Goals (current goals can now be found in the care plan section) Progress towards PT goals: Progressing toward goals    Frequency    BID  PT Plan Current plan remains appropriate    Co-evaluation              AM-PAC PT "6 Clicks" Mobility   Outcome Measure  Help needed turning from your back to your side while in a flat bed without using bedrails?: A Little Help needed moving from lying on your back to sitting on the side of a flat bed without using bedrails?: A Little Help needed moving to and from a bed to a chair (including a wheelchair)?: A Little Help needed standing up from a chair using your arms (e.g., wheelchair or bedside chair)?: A Little Help needed to walk in hospital room?: A Little Help needed climbing 3-5 steps with a railing? : A Lot 6 Click Score: 17    End of Session Equipment Utilized During Treatment: Gait belt Activity Tolerance: Patient tolerated treatment well Patient left: in bed;with call bell/phone within  reach;with SCD's reapplied   PT Visit Diagnosis: Muscle weakness (generalized) (M62.81);Difficulty in walking, not elsewhere classified (R26.2);Pain Pain - Right/Left: Right Pain - part of body: Hip     Time: 0929-1000 PT Time Calculation (min) (ACUTE ONLY): 31 min  Charges:  $Gait Training: 8-22 mins $Therapeutic Exercise: 8-22 mins                     Kreg Shropshire, DPT 04/10/2020, 12:54 PM

## 2020-04-10 NOTE — Plan of Care (Signed)
  Problem: Education: Goal: Knowledge of the prescribed therapeutic regimen will improve Outcome: Adequate for Discharge Goal: Understanding of discharge needs will improve Outcome: Adequate for Discharge   Problem: Clinical Measurements: Goal: Postoperative complications will be avoided or minimized Outcome: Adequate for Discharge   Problem: Pain Management: Goal: Pain level will decrease with appropriate interventions Outcome: Adequate for Discharge   Problem: Skin Integrity: Goal: Will show signs of wound healing Outcome: Adequate for Discharge   Problem: Education: Goal: Knowledge of General Education information will improve Description: Including pain rating scale, medication(s)/side effects and non-pharmacologic comfort measures Outcome: Adequate for Discharge   Problem: Health Behavior/Discharge Planning: Goal: Ability to manage health-related needs will improve Outcome: Adequate for Discharge   Problem: Clinical Measurements: Goal: Will remain free from infection Outcome: Adequate for Discharge   Problem: Nutrition: Goal: Adequate nutrition will be maintained Outcome: Adequate for Discharge   Problem: Elimination: Goal: Will not experience complications related to bowel motility Outcome: Adequate for Discharge Goal: Will not experience complications related to urinary retention Outcome: Adequate for Discharge   Problem: Pain Managment: Goal: General experience of comfort will improve Outcome: Adequate for Discharge   Problem: Safety: Goal: Ability to remain free from injury will improve Outcome: Adequate for Discharge   Problem: Skin Integrity: Goal: Risk for impaired skin integrity will decrease Outcome: Adequate for Discharge

## 2020-04-10 NOTE — Progress Notes (Signed)
   Subjective: 2 Days Post-Op Procedure(s) (LRB): TOTAL HIP ARTHROPLASTY ANTERIOR APPROACH (Right) Patient reports pain as mild.   Patient is well, and has had no acute complaints or problems Denies any CP, SOB, ABD pain. We will continue therapy today.  Plan is to go Home after hospital stay.  Objective: Vital signs in last 24 hours: Temp:  [97.8 F (36.6 C)-98.5 F (36.9 C)] 97.8 F (36.6 C) (03/26 0441) Pulse Rate:  [56-109] 109 (03/26 0441) Resp:  [16-17] 16 (03/26 0441) BP: (108-131)/(60-80) 131/80 (03/26 0441) SpO2:  [90 %-94 %] 94 % (03/26 0441)  Intake/Output from previous day: 03/25 0701 - 03/26 0700 In: 480 [P.O.:480] Out: 20 [Drains:20] Intake/Output this shift: No intake/output data recorded.  Recent Labs    04/08/20 0907 04/09/20 0505 04/10/20 0425  HGB 13.6 11.3* 10.5*   Recent Labs    04/09/20 0505 04/10/20 0425  WBC 8.3 8.2  RBC 3.80* 3.52*  HCT 34.2* 32.1*  PLT 187 169   Recent Labs    04/08/20 0907 04/09/20 0505  NA 141 136  K 3.5 3.7  CL 111 107  CO2 21* 24  BUN 16 14  CREATININE 0.56 0.79  GLUCOSE 91 116*  CALCIUM 9.0 7.9*   No results for input(s): LABPT, INR in the last 72 hours.  EXAM General - Patient is Alert, Appropriate and Oriented Extremity - Neurovascular intact Sensation intact distally Intact pulses distally No cellulitis present Compartment soft Dressing - dressing C/D/I and no drainage , prevena intact with minimal draiange Motor Function - intact, moving foot and toes well on exam.  Ambulated 120 feet with physical therapy  Past Medical History:  Diagnosis Date  . Arthritis   . DVT (deep venous thrombosis) ([place]) 2015   pulmonary emboli  . Headache   . History of kidney stones   . Lichen sclerosus   . Pneumonia 2015  . PONV (postoperative nausea and vomiting)   . Pulmonary emboli ([place]) 2015  . Restless leg syndrome     Assessment/Plan:   2 Days Post-Op Procedure(s) (LRB): TOTAL HIP ARTHROPLASTY  ANTERIOR APPROACH (Right) Active Problems:   S/P hip replacement  Estimated body mass index is 39.61 kg/m as calculated from the following:   Height as of this encounter: 5\' 5"  (1.651 m).   Weight as of this encounter: 108 kg. Advance diet Up with therapy  Work on BM.  Passing gas. Labs and VSS Pain well controlled CM to assist with discharge to home with HHPT today  DVT Prophylaxis - Lovenox, TED hose and SCDs Weight-Bearing as tolerated to right leg   Reche Dixon, PA-C [place] 04/10/2020, 7:04 AM

## 2020-04-11 NOTE — Anesthesia Postprocedure Evaluation (Signed)
Anesthesia Post Note  Patient: Savannah Wilkins  Procedure(s) Performed: TOTAL HIP ARTHROPLASTY ANTERIOR APPROACH (Right Hip)  Patient location during evaluation: Nursing Unit Anesthesia Type: Spinal Level of consciousness: oriented and awake and alert Pain management: pain level controlled Vital Signs Assessment: post-procedure vital signs reviewed and stable Respiratory status: spontaneous breathing Cardiovascular status: blood pressure returned to baseline and stable Postop Assessment: no headache, no backache, no apparent nausea or vomiting and patient able to bend at knees Anesthetic complications: no   No complications documented.   Last Vitals:  Vitals:   04/10/20 0441 04/10/20 0752  BP: 131/80 128/71  Pulse: (!) 109 86  Resp: 16 16  Temp: 36.6 C 36.8 C  SpO2: 94% 94%    Last Pain:  Vitals:   04/10/20 0900  TempSrc:   PainSc: 2                  Precious Haws Jalal Rauch

## 2020-04-12 LAB — SURGICAL PATHOLOGY

## 2020-07-15 ENCOUNTER — Other Ambulatory Visit: Payer: Self-pay | Admitting: Orthopedic Surgery

## 2020-08-03 ENCOUNTER — Encounter
Admission: RE | Admit: 2020-08-03 | Discharge: 2020-08-03 | Disposition: A | Discharge status: Home / Self Care | Payer: Medicare PPO | Source: Ambulatory Visit | Attending: Orthopedic Surgery | Admitting: Orthopedic Surgery

## 2020-08-03 ENCOUNTER — Other Ambulatory Visit: Payer: Self-pay

## 2020-08-03 DIAGNOSIS — Z01812 Encounter for preprocedural laboratory examination: Secondary | ICD-10-CM | POA: Diagnosis present

## 2020-08-03 LAB — URINALYSIS, ROUTINE W REFLEX MICROSCOPIC
Bilirubin Urine: NEGATIVE
Glucose, UA: NEGATIVE mg/dL
Hgb urine dipstick: NEGATIVE
Ketones, ur: 5 mg/dL — AB
Leukocytes,Ua: NEGATIVE
Nitrite: NEGATIVE
Protein, ur: NEGATIVE mg/dL
Specific Gravity, Urine: 1.025 (ref 1.005–1.030)
pH: 5 (ref 5.0–8.0)

## 2020-08-03 LAB — TYPE AND SCREEN
ABO/RH(D): O POS
Antibody Screen: NEGATIVE

## 2020-08-03 LAB — COMPREHENSIVE METABOLIC PANEL
ALT: 9 U/L (ref 0–44)
AST: 16 U/L (ref 15–41)
Albumin: 4.1 g/dL (ref 3.5–5.0)
Alkaline Phosphatase: 79 U/L (ref 38–126)
Anion gap: 8 (ref 5–15)
BUN: 16 mg/dL (ref 8–23)
CO2: 27 mmol/L (ref 22–32)
Calcium: 9.1 mg/dL (ref 8.9–10.3)
Chloride: 106 mmol/L (ref 98–111)
Creatinine, Ser: 0.56 mg/dL (ref 0.44–1.00)
GFR, Estimated: >60 mL/min (ref >60)
Glucose, Bld: 98 mg/dL (ref 70–99)
Potassium: 3.4 mmol/L — ABNORMAL LOW (ref 3.5–5.1)
Sodium: 141 mmol/L (ref 135–145)
Total Bilirubin: 0.7 mg/dL (ref 0.3–1.2)
Total Protein: 6.7 g/dL (ref 6.5–8.1)

## 2020-08-03 LAB — CBC WITH DIFFERENTIAL/PLATELET
Abs Immature Granulocytes: 0.01 10*3/uL (ref 0.00–0.07)
Basophils Absolute: 0.1 10*3/uL (ref 0.0–0.1)
Basophils Relative: 1 %
Eosinophils Absolute: 0.1 10*3/uL (ref 0.0–0.5)
Eosinophils Relative: 2 %
HCT: 41.5 % (ref 36.0–46.0)
Hemoglobin: 13.5 g/dL (ref 12.0–15.0)
Immature Granulocytes: 0 %
Lymphocytes Relative: 36 %
Lymphs Abs: 2.4 10*3/uL (ref 0.7–4.0)
MCH: 28.8 pg (ref 26.0–34.0)
MCHC: 32.5 g/dL (ref 30.0–36.0)
MCV: 88.7 fL (ref 80.0–100.0)
Monocytes Absolute: 0.4 10*3/uL (ref 0.1–1.0)
Monocytes Relative: 6 %
Neutro Abs: 3.8 10*3/uL (ref 1.7–7.7)
Neutrophils Relative %: 55 %
Platelets: 242 10*3/uL (ref 150–400)
RBC: 4.68 MIL/uL (ref 3.87–5.11)
RDW: 14.3 % (ref 11.5–15.5)
WBC: 6.8 10*3/uL (ref 4.0–10.5)
nRBC: 0 % (ref 0.0–0.2)

## 2020-08-03 LAB — SURGICAL PCR SCREEN
MRSA, PCR: NEGATIVE
Staphylococcus aureus: NEGATIVE

## 2020-08-03 NOTE — Patient Instructions (Signed)
Your procedure is scheduled on: 08/12/20 Report to [place]. To find out your arrival time please call [phone] between 1PM - 3PM on 08/11/20.  Remember: Instructions that are not followed completely may result in serious medical risk, up to and including death, or upon the discretion of your surgeon and anesthesiologist your surgery may need to be rescheduled.     _X__ 1. Do not eat food after midnight the night before your procedure.                 No gum chewing or hard candies. You may drink clear liquids up to 2 hours                 before you are scheduled to arrive for your surgery- DO not drink clear                 liquids within 2 hours of the start of your surgery.                 Clear Liquids include:  water, apple juice without pulp, clear carbohydrate                 drink such as Clearfast or Gatorade, Black Coffee or Tea (Do not add                 anything to coffee or tea). Diabetics water only Finish the Ensure "Clear" Pre Surgery drink by 4:30 am or 2 hours prior to your arrival  __X__2.  On the morning of surgery brush your teeth with toothpaste and water, you                 may rinse your mouth with mouthwash if you wish.  Do not swallow any              toothpaste of mouthwash.     _X__ 3.  No Alcohol for 24 hours before or after surgery.   _X__ 4.  Do Not Smoke or use e-cigarettes For 24 Hours Prior to Your Surgery.                 Do not use any chewable tobacco products for at least 6 hours prior to                 surgery.  ____  5.  Bring all medications with you on the day of surgery if instructed.   __X__  6.  Notify your doctor if there is any change in your medical condition      (cold, fever, infections).     Do not wear jewelry, make-up, hairpins, clips or nail polish. Do not wear lotions, powders, or perfumes.  Do not shave 48 hours prior to surgery. Men may shave face and neck. Do not  bring valuables to the hospital.    [facility] is not responsible for any belongings or valuables.  Contacts, dentures/partials or body piercings may not be worn into surgery. Bring a case for your contacts, glasses or hearing aids, a denture cup will be supplied. Leave your suitcase in the car. After surgery it may be brought to your room. For patients admitted to the hospital, discharge time is determined by your treatment team.   Patients discharged the day of surgery will not be allowed to drive home.   Please read over the following fact sheets that you were given:   MRSA  Information, CHG, Incentive Spirometer, Ensure  __X__ Take these medicines the morning of surgery with A SIP OF WATER:    1. none  2.   3.   4.  5.  6.  ____ Fleet Enema (as directed)   __X__ Use CHG Soap/SAGE wipes as directed  ____ Use inhalers on the day of surgery  ____ Stop metformin/Janumet/Farxiga 2 days prior to surgery    ____ Take 1/2 of usual insulin dose the night before surgery. No insulin the morning          of surgery.   ____ Stop Blood Thinners Coumadin/Plavix/Xarelto/Pleta/Pradaxa/Eliquis/Effient/Aspirin  on   Or contact your Surgeon, Cardiologist or Medical Doctor regarding  ability to stop your blood thinners  __X__ Stop Anti-inflammatories 7 days before surgery such as Advil, Ibuprofen, Motrin,  BC or Goodies Powder, Naprosyn, Naproxen, Aleve, Aspirin   You may take Tylenol if needed  __X__ Stop all herbal supplements, fish oil or vitamin E until after surgery. You can start your Vitamin D if you wish.   ____ Bring C-Pap to the hospital.

## 2020-08-10 ENCOUNTER — Other Ambulatory Visit: Payer: Self-pay

## 2020-08-10 ENCOUNTER — Other Ambulatory Visit
Admission: RE | Admit: 2020-08-10 | Discharge: 2020-08-10 | Disposition: A | Discharge status: Home / Self Care | Payer: Medicare PPO | Source: Ambulatory Visit | Attending: Orthopedic Surgery | Admitting: Orthopedic Surgery

## 2020-08-10 DIAGNOSIS — Z01812 Encounter for preprocedural laboratory examination: Secondary | ICD-10-CM | POA: Insufficient documentation

## 2020-08-10 DIAGNOSIS — Z20822 Contact with and (suspected) exposure to covid-19: Secondary | ICD-10-CM | POA: Insufficient documentation

## 2020-08-10 LAB — SARS CORONAVIRUS 2 (TAT 6-24 HRS): SARS Coronavirus 2: NEGATIVE

## 2020-08-12 ENCOUNTER — Inpatient Hospital Stay: Payer: Medicare PPO

## 2020-08-12 ENCOUNTER — Encounter
Admission: RE | Disposition: A | Discharge status: Home / Self Care | Payer: Self-pay | Source: Home / Self Care | Attending: Orthopedic Surgery

## 2020-08-12 ENCOUNTER — Inpatient Hospital Stay
Admission: RE | Admit: 2020-08-12 | Discharge: 2020-08-14 | DRG: 470 | Disposition: A | Discharge status: Home Health | Payer: Medicare PPO | Attending: Orthopedic Surgery | Admitting: Orthopedic Surgery

## 2020-08-12 ENCOUNTER — Inpatient Hospital Stay: Payer: Medicare PPO | Admitting: Certified Registered"

## 2020-08-12 ENCOUNTER — Other Ambulatory Visit: Payer: Self-pay

## 2020-08-12 ENCOUNTER — Encounter: Payer: Self-pay | Admitting: Orthopedic Surgery

## 2020-08-12 DIAGNOSIS — Z96641 Presence of right artificial hip joint: Secondary | ICD-10-CM | POA: Diagnosis present

## 2020-08-12 DIAGNOSIS — Z8701 Personal history of pneumonia (recurrent): Secondary | ICD-10-CM | POA: Diagnosis not present

## 2020-08-12 DIAGNOSIS — Z95828 Presence of other vascular implants and grafts: Secondary | ICD-10-CM

## 2020-08-12 DIAGNOSIS — Z79899 Other long term (current) drug therapy: Secondary | ICD-10-CM | POA: Diagnosis not present

## 2020-08-12 DIAGNOSIS — Z86711 Personal history of pulmonary embolism: Secondary | ICD-10-CM

## 2020-08-12 DIAGNOSIS — Z882 Allergy status to sulfonamides status: Secondary | ICD-10-CM

## 2020-08-12 DIAGNOSIS — L900 Lichen sclerosus et atrophicus: Secondary | ICD-10-CM | POA: Diagnosis present

## 2020-08-12 DIAGNOSIS — Z8042 Family history of malignant neoplasm of prostate: Secondary | ICD-10-CM | POA: Diagnosis not present

## 2020-08-12 DIAGNOSIS — Z419 Encounter for procedure for purposes other than remedying health state, unspecified: Secondary | ICD-10-CM

## 2020-08-12 DIAGNOSIS — M1612 Unilateral primary osteoarthritis, left hip: Secondary | ICD-10-CM | POA: Diagnosis present

## 2020-08-12 DIAGNOSIS — Z20822 Contact with and (suspected) exposure to covid-19: Secondary | ICD-10-CM | POA: Diagnosis present

## 2020-08-12 DIAGNOSIS — Z823 Family history of stroke: Secondary | ICD-10-CM | POA: Diagnosis not present

## 2020-08-12 DIAGNOSIS — Z880 Allergy status to penicillin: Secondary | ICD-10-CM | POA: Diagnosis not present

## 2020-08-12 DIAGNOSIS — Z7982 Long term (current) use of aspirin: Secondary | ICD-10-CM

## 2020-08-12 DIAGNOSIS — Z87442 Personal history of urinary calculi: Secondary | ICD-10-CM | POA: Diagnosis not present

## 2020-08-12 DIAGNOSIS — Z8249 Family history of ischemic heart disease and other diseases of the circulatory system: Secondary | ICD-10-CM

## 2020-08-12 DIAGNOSIS — Z881 Allergy status to other antibiotic agents status: Secondary | ICD-10-CM

## 2020-08-12 DIAGNOSIS — Z6841 Body Mass Index (BMI) 40.0 and over, adult: Secondary | ICD-10-CM | POA: Diagnosis not present

## 2020-08-12 DIAGNOSIS — G8918 Other acute postprocedural pain: Secondary | ICD-10-CM

## 2020-08-12 DIAGNOSIS — Z96642 Presence of left artificial hip joint: Secondary | ICD-10-CM

## 2020-08-12 DIAGNOSIS — Z86718 Personal history of other venous thrombosis and embolism: Secondary | ICD-10-CM | POA: Diagnosis not present

## 2020-08-12 HISTORY — PX: TOTAL HIP ARTHROPLASTY: SHX124

## 2020-08-12 SURGERY — ARTHROPLASTY, HIP, TOTAL, ANTERIOR APPROACH
Anesthesia: Spinal | Site: Hip | Laterality: Left

## 2020-08-12 MED ORDER — SODIUM CHLORIDE 0.9 % IV SOLN
INTRAVENOUS | Status: DC | PRN
  Administered 2020-08-12: 40 ug/min via INTRAVENOUS

## 2020-08-12 MED ORDER — PROMETHAZINE HCL 25 MG/ML IJ SOLN
6.2500 mg | INTRAMUSCULAR | Status: DC | PRN
Start: 2020-08-12 — End: 2020-08-12

## 2020-08-12 MED ORDER — OXYCODONE HCL 5 MG PO TABS: 5.0000 mg | ORAL_TABLET | Freq: Once | ORAL | Status: DC | PRN

## 2020-08-12 MED ORDER — GLYCOPYRROLATE 0.2 MG/ML IJ SOLN
INTRAMUSCULAR | Status: AC
  Filled 2020-08-12: qty 1

## 2020-08-12 MED ORDER — FAMOTIDINE 20 MG PO TABS: 20.0000 mg | ORAL_TABLET | Freq: Once | ORAL | Status: AC

## 2020-08-12 MED ORDER — PROPOFOL 10 MG/ML IV BOLUS
INTRAVENOUS | Status: DC | PRN
  Administered 2020-08-12: 50 mg via INTRAVENOUS
  Administered 2020-08-12 (×2): 30 mg via INTRAVENOUS

## 2020-08-12 MED ORDER — BUPIVACAINE LIPOSOME 1.3 % IJ SUSP
INTRAMUSCULAR | Status: AC
  Filled 2020-08-12: qty 20

## 2020-08-12 MED ORDER — METHOCARBAMOL 1000 MG/10ML IJ SOLN
500.0000 mg | Freq: Four times a day (QID) | INTRAVENOUS | Status: DC | PRN
  Filled 2020-08-12: qty 5

## 2020-08-12 MED ORDER — ACETAMINOPHEN 325 MG PO TABS: 325.0000 mg | ORAL_TABLET | Freq: Four times a day (QID) | ORAL | Status: DC | PRN

## 2020-08-12 MED ORDER — ALUM & MAG HYDROXIDE-SIMETH 200-200-20 MG/5ML PO SUSP: 30.0000 mL | ORAL | Status: DC | PRN

## 2020-08-12 MED ORDER — GLYCOPYRROLATE 0.2 MG/ML IJ SOLN
INTRAMUSCULAR | Status: DC | PRN
  Administered 2020-08-12: .2 mg via INTRAVENOUS

## 2020-08-12 MED ORDER — MIDAZOLAM HCL 5 MG/5ML IJ SOLN
INTRAMUSCULAR | Status: DC | PRN
  Administered 2020-08-12: 2 mg via INTRAVENOUS

## 2020-08-12 MED ORDER — FAMOTIDINE 20 MG PO TABS
ORAL_TABLET | ORAL | Status: AC
  Administered 2020-08-12: 20 mg via ORAL
  Filled 2020-08-12: qty 1

## 2020-08-12 MED ORDER — PANTOPRAZOLE SODIUM 40 MG PO TBEC
40.0000 mg | DELAYED_RELEASE_TABLET | Freq: Every day | ORAL | Status: DC
  Administered 2020-08-12 – 2020-08-14 (×3): 40 mg via ORAL
  Filled 2020-08-12 (×3): qty 1

## 2020-08-12 MED ORDER — PROPOFOL 500 MG/50ML IV EMUL
INTRAVENOUS | Status: DC | PRN
  Administered 2020-08-12: 60 ug/kg/min via INTRAVENOUS

## 2020-08-12 MED ORDER — MEPERIDINE HCL 25 MG/ML IJ SOLN: 6.2500 mg | INTRAMUSCULAR | Status: DC | PRN

## 2020-08-12 MED ORDER — OXYCODONE HCL 5 MG/5ML PO SOLN
5.0000 mg | Freq: Once | ORAL | Status: DC | PRN
Start: 2020-08-12 — End: 2020-08-12

## 2020-08-12 MED ORDER — ONDANSETRON HCL 4 MG/2ML IJ SOLN: 4.0000 mg | Freq: Four times a day (QID) | INTRAMUSCULAR | Status: DC | PRN

## 2020-08-12 MED ORDER — FLEET ENEMA 7-19 GM/118ML RE ENEM: 1.0000 | ENEMA | Freq: Once | RECTAL | Status: DC | PRN

## 2020-08-12 MED ORDER — GALCANEZUMAB-GNLM 120 MG/ML ~~LOC~~ SOAJ: 120.0000 mg | SUBCUTANEOUS | Status: DC

## 2020-08-12 MED ORDER — PHENOL 1.4 % MT LIQD
1.0000 | OROMUCOSAL | Status: DC | PRN
  Filled 2020-08-12: qty 177

## 2020-08-12 MED ORDER — CHLORHEXIDINE GLUCONATE CLOTH 2 % EX PADS
6.0000 | MEDICATED_PAD | Freq: Every day | CUTANEOUS | Status: DC
  Administered 2020-08-13: 6 via TOPICAL

## 2020-08-12 MED ORDER — DOCUSATE SODIUM 100 MG PO CAPS
100.0000 mg | ORAL_CAPSULE | Freq: Two times a day (BID) | ORAL | Status: DC
  Administered 2020-08-12 – 2020-08-14 (×4): 100 mg via ORAL
  Filled 2020-08-12 (×4): qty 1

## 2020-08-12 MED ORDER — METOCLOPRAMIDE HCL 10 MG PO TABS: 5.0000 mg | ORAL_TABLET | Freq: Three times a day (TID) | ORAL | Status: DC | PRN

## 2020-08-12 MED ORDER — ASPIRIN EC 81 MG PO TBEC
81.0000 mg | DELAYED_RELEASE_TABLET | Freq: Every day | ORAL | Status: DC
  Administered 2020-08-12 – 2020-08-13 (×2): 81 mg via ORAL
  Filled 2020-08-12 (×2): qty 1

## 2020-08-12 MED ORDER — CHLORHEXIDINE GLUCONATE 0.12 % MT SOLN: 15.0000 mL | Freq: Once | OROMUCOSAL | Status: AC

## 2020-08-12 MED ORDER — FENTANYL CITRATE (PF) 100 MCG/2ML IJ SOLN: 25.0000 ug | INTRAMUSCULAR | Status: DC | PRN

## 2020-08-12 MED ORDER — HYDROCODONE-ACETAMINOPHEN 5-325 MG PO TABS
1.0000 | ORAL_TABLET | ORAL | Status: DC | PRN
Start: 2020-08-12 — End: 2020-08-14
  Administered 2020-08-12 – 2020-08-14 (×4): 1 via ORAL
  Filled 2020-08-12 (×4): qty 1

## 2020-08-12 MED ORDER — METOCLOPRAMIDE HCL 5 MG/ML IJ SOLN: 5.0000 mg | Freq: Three times a day (TID) | INTRAMUSCULAR | Status: DC | PRN

## 2020-08-12 MED ORDER — ONDANSETRON HCL 4 MG/2ML IJ SOLN
INTRAMUSCULAR | Status: DC | PRN
Start: 2020-08-12 — End: 2020-08-12
  Administered 2020-08-12: 4 mg via INTRAVENOUS

## 2020-08-12 MED ORDER — APIXABAN 5 MG PO TABS
5.0000 mg | ORAL_TABLET | Freq: Two times a day (BID) | ORAL | Status: DC
  Administered 2020-08-13 – 2020-08-14 (×3): 5 mg via ORAL
  Filled 2020-08-12 (×3): qty 1

## 2020-08-12 MED ORDER — BISACODYL 10 MG RE SUPP: 10.0000 mg | Freq: Every day | RECTAL | Status: DC | PRN

## 2020-08-12 MED ORDER — DROPERIDOL 2.5 MG/ML IJ SOLN
0.6250 mg | Freq: Once | INTRAMUSCULAR | Status: DC | PRN
  Filled 2020-08-12: qty 2

## 2020-08-12 MED ORDER — MENTHOL 3 MG MT LOZG
1.0000 | LOZENGE | OROMUCOSAL | Status: DC | PRN
  Filled 2020-08-12: qty 9

## 2020-08-12 MED ORDER — DIPHENHYDRAMINE HCL 12.5 MG/5ML PO ELIX: 12.5000 mg | ORAL_SOLUTION | ORAL | Status: DC | PRN

## 2020-08-12 MED ORDER — CEFAZOLIN SODIUM-DEXTROSE 2-4 GM/100ML-% IV SOLN
2.0000 g | INTRAVENOUS | Status: AC
  Administered 2020-08-12: 2 g via INTRAVENOUS

## 2020-08-12 MED ORDER — CLOBETASOL PROPIONATE 0.05 % EX OINT
1.0000 "application " | TOPICAL_OINTMENT | CUTANEOUS | Status: DC
  Filled 2020-08-12 (×2): qty 15

## 2020-08-12 MED ORDER — ONDANSETRON HCL 4 MG PO TABS: 4.0000 mg | ORAL_TABLET | Freq: Four times a day (QID) | ORAL | Status: DC | PRN

## 2020-08-12 MED ORDER — BUPIVACAINE-EPINEPHRINE (PF) 0.25% -1:200000 IJ SOLN
INTRAMUSCULAR | Status: AC
  Filled 2020-08-12: qty 30

## 2020-08-12 MED ORDER — DEXAMETHASONE SODIUM PHOSPHATE 10 MG/ML IJ SOLN
INTRAMUSCULAR | Status: AC
  Filled 2020-08-12: qty 1

## 2020-08-12 MED ORDER — ZOLPIDEM TARTRATE 5 MG PO TABS: 5.0000 mg | ORAL_TABLET | Freq: Every evening | ORAL | Status: DC | PRN

## 2020-08-12 MED ORDER — ACETAMINOPHEN 10 MG/ML IV SOLN: 1000.0000 mg | Freq: Once | INTRAVENOUS | Status: DC | PRN

## 2020-08-12 MED ORDER — MIDAZOLAM HCL 2 MG/2ML IJ SOLN
INTRAMUSCULAR | Status: AC
  Filled 2020-08-12: qty 2

## 2020-08-12 MED ORDER — LACTATED RINGERS IV SOLN: INTRAVENOUS | Status: DC

## 2020-08-12 MED ORDER — TRAMADOL HCL 50 MG PO TABS
50.0000 mg | ORAL_TABLET | Freq: Four times a day (QID) | ORAL | Status: DC
  Administered 2020-08-12 – 2020-08-14 (×8): 50 mg via ORAL
  Filled 2020-08-12 (×8): qty 1

## 2020-08-12 MED ORDER — SODIUM CHLORIDE FLUSH 0.9 % IV SOLN
INTRAVENOUS | Status: AC
  Filled 2020-08-12: qty 40

## 2020-08-12 MED ORDER — MORPHINE SULFATE (PF) 2 MG/ML IV SOLN: 0.5000 mg | INTRAVENOUS | Status: DC | PRN

## 2020-08-12 MED ORDER — CEFAZOLIN SODIUM-DEXTROSE 2-4 GM/100ML-% IV SOLN
2.0000 g | Freq: Four times a day (QID) | INTRAVENOUS | Status: AC
Start: 2020-08-12 — End: 2020-08-13
  Administered 2020-08-12 (×2): 2 g via INTRAVENOUS
  Filled 2020-08-12 (×2): qty 100

## 2020-08-12 MED ORDER — ORAL CARE MOUTH RINSE: 15.0000 mL | Freq: Once | OROMUCOSAL | Status: AC

## 2020-08-12 MED ORDER — BUPIVACAINE-EPINEPHRINE 0.25% -1:200000 IJ SOLN
INTRAMUSCULAR | Status: DC | PRN
  Administered 2020-08-12: 30 mL

## 2020-08-12 MED ORDER — METHOCARBAMOL 500 MG PO TABS
500.0000 mg | ORAL_TABLET | Freq: Four times a day (QID) | ORAL | Status: DC | PRN
  Administered 2020-08-13 – 2020-08-14 (×3): 500 mg via ORAL
  Filled 2020-08-12 (×3): qty 1

## 2020-08-12 MED ORDER — 0.9 % SODIUM CHLORIDE (POUR BTL) OPTIME
TOPICAL | Status: DC | PRN
  Administered 2020-08-12: 1000 mL

## 2020-08-12 MED ORDER — HYDROCODONE-ACETAMINOPHEN 7.5-325 MG PO TABS
1.0000 | ORAL_TABLET | ORAL | Status: DC | PRN
  Administered 2020-08-13: 1 via ORAL
  Filled 2020-08-12: qty 1

## 2020-08-12 MED ORDER — CHLORHEXIDINE GLUCONATE 0.12 % MT SOLN
OROMUCOSAL | Status: AC
  Administered 2020-08-12: 15 mL via OROMUCOSAL
  Filled 2020-08-12: qty 15

## 2020-08-12 MED ORDER — ONDANSETRON HCL 4 MG/2ML IJ SOLN
INTRAMUSCULAR | Status: AC
  Filled 2020-08-12: qty 2

## 2020-08-12 MED ORDER — ARTIFICIAL TEARS OPHTHALMIC OINT
TOPICAL_OINTMENT | Freq: Every evening | OPHTHALMIC | Status: DC | PRN
  Filled 2020-08-12: qty 3.5

## 2020-08-12 MED ORDER — SODIUM CHLORIDE 0.9 % IV SOLN: INTRAVENOUS | Status: DC

## 2020-08-12 MED ORDER — APREPITANT 40 MG PO CAPS
ORAL_CAPSULE | ORAL | Status: AC
  Administered 2020-08-12: 40 mg via ORAL
  Filled 2020-08-12: qty 1

## 2020-08-12 MED ORDER — POLYETHYLENE GLYCOL 3350 17 G PO PACK
17.0000 g | PACK | Freq: Every day | ORAL | Status: DC | PRN
  Administered 2020-08-14: 17 g via ORAL
  Filled 2020-08-12: qty 1

## 2020-08-12 MED ORDER — OCUVITE-LUTEIN PO CAPS
1.0000 | ORAL_CAPSULE | Freq: Every day | ORAL | Status: DC
  Administered 2020-08-12 – 2020-08-14 (×3): 1 via ORAL
  Filled 2020-08-12 (×4): qty 1

## 2020-08-12 MED ORDER — BUPIVACAINE HCL (PF) 0.5 % IJ SOLN
INTRAMUSCULAR | Status: DC | PRN
  Administered 2020-08-12: 2.5 mL

## 2020-08-12 MED ORDER — APREPITANT 40 MG PO CAPS: 40.0000 mg | ORAL_CAPSULE | Freq: Once | ORAL | Status: AC

## 2020-08-12 MED ORDER — DEXAMETHASONE SODIUM PHOSPHATE 10 MG/ML IJ SOLN
INTRAMUSCULAR | Status: DC | PRN
Start: 2020-08-12 — End: 2020-08-12
  Administered 2020-08-12: 10 mg via INTRAVENOUS

## 2020-08-12 MED ORDER — CEFAZOLIN SODIUM-DEXTROSE 2-4 GM/100ML-% IV SOLN
INTRAVENOUS | Status: AC
  Filled 2020-08-12: qty 100

## 2020-08-12 MED ORDER — CARBOXYMETHYLCELLUL-GLYCERIN 0.5-0.9 % OP SOLN: 1.0000 [drp] | Freq: Three times a day (TID) | OPHTHALMIC | Status: DC | PRN

## 2020-08-12 MED ORDER — SODIUM CHLORIDE 0.9 % IV SOLN
INTRAVENOUS | Status: DC | PRN
  Administered 2020-08-12: 60 mL

## 2020-08-12 MED ORDER — PROPOFOL 1000 MG/100ML IV EMUL
INTRAVENOUS | Status: AC
  Filled 2020-08-12: qty 100

## 2020-08-12 SURGICAL SUPPLY — 62 items
BLADE SAGITTAL AGGR TOOTH XLG (BLADE) ×2 IMPLANT
BNDG COHESIVE 6X5 TAN ST LF (GAUZE/BANDAGES/DRESSINGS) ×6 IMPLANT
CANISTER SUCT 1200ML W/VALVE (MISCELLANEOUS) ×2 IMPLANT
CANISTER WOUND CARE 500ML ATS (WOUND CARE) ×2 IMPLANT
CHLORAPREP W/TINT 26 (MISCELLANEOUS) ×2 IMPLANT
COVER BACK TABLE REUSABLE LG (DRAPES) ×2 IMPLANT
DRAPE 3/4 80X56 (DRAPES) ×6 IMPLANT
DRAPE C-ARM XRAY 36X54 (DRAPES) ×2 IMPLANT
DRAPE INCISE IOBAN 66X60 STRL (DRAPES) IMPLANT
DRAPE POUCH INSTRU U-SHP 10X18 (DRAPES) ×2 IMPLANT
DRESSING SURGICEL FIBRLLR 1X2 (HEMOSTASIS) ×2 IMPLANT
DRSG MEPILEX SACRM 8.7X9.8 (GAUZE/BANDAGES/DRESSINGS) ×2 IMPLANT
DRSG OPSITE POSTOP 4X8 (GAUZE/BANDAGES/DRESSINGS) ×4 IMPLANT
DRSG SURGICEL FIBRILLAR 1X2 (HEMOSTASIS) ×4
ELECT BLADE 6.5 EXT (BLADE) ×2 IMPLANT
ELECT REM PT RETURN 9FT ADLT (ELECTROSURGICAL) ×2
ELECTRODE REM PT RTRN 9FT ADLT (ELECTROSURGICAL) ×1 IMPLANT
GAUZE 4X4 16PLY ~~LOC~~+RFID DBL (SPONGE) ×2 IMPLANT
GLOVE SURG SYN 9.0  PF PI (GLOVE) ×2
GLOVE SURG SYN 9.0 PF PI (GLOVE) ×2 IMPLANT
GLOVE SURG UNDER POLY LF SZ9 (GLOVE) ×2 IMPLANT
GOWN SRG 2XL LVL 4 RGLN SLV (GOWNS) ×1 IMPLANT
GOWN STRL NON-REIN 2XL LVL4 (GOWNS) ×1
GOWN STRL REUS W/ TWL LRG LVL3 (GOWN DISPOSABLE) ×1 IMPLANT
GOWN STRL REUS W/TWL LRG LVL3 (GOWN DISPOSABLE) ×1
HEMOVAC 400CC 10FR (MISCELLANEOUS) IMPLANT
HIP DBL LINER 54X28 (Liner) ×2 IMPLANT
HIP FEM HD S 28 (Head) ×2 IMPLANT
HOLDER FOLEY CATH W/STRAP (MISCELLANEOUS) ×2 IMPLANT
HOOD PEEL AWAY FLYTE STAYCOOL (MISCELLANEOUS) ×2 IMPLANT
IRRIGATION SURGIPHOR STRL (IV SOLUTION) IMPLANT
KIT PREVENA INCISION MGT 13 (CANNISTER) ×2 IMPLANT
MANIFOLD NEPTUNE II (INSTRUMENTS) ×2 IMPLANT
MASTERLOC HIP LATERAL S5 (Hips) ×2 IMPLANT
MAT ABSORB  FLUID 56X50 GRAY (MISCELLANEOUS) ×1
MAT ABSORB FLUID 56X50 GRAY (MISCELLANEOUS) ×1 IMPLANT
NDL SAFETY ECLIPSE 18X1.5 (NEEDLE) ×1 IMPLANT
NEEDLE HYPO 18GX1.5 SHARP (NEEDLE) ×1
NEEDLE SPNL 20GX3.5 QUINCKE YW (NEEDLE) ×4 IMPLANT
NS IRRIG 1000ML POUR BTL (IV SOLUTION) ×2 IMPLANT
PACK HIP COMPR (MISCELLANEOUS) ×2 IMPLANT
SCALPEL PROTECTED #10 DISP (BLADE) ×4 IMPLANT
SHELL ACETABULAR SZ 54 DM (Shell) ×2 IMPLANT
SOL PREP PVP 2OZ (MISCELLANEOUS) ×2
SOLUTION PREP PVP 2OZ (MISCELLANEOUS) ×1 IMPLANT
SPONGE DRAIN TRACH 4X4 STRL 2S (GAUZE/BANDAGES/DRESSINGS) ×2 IMPLANT
SPONGE T-LAP 18X18 ~~LOC~~+RFID (SPONGE) ×4 IMPLANT
STAPLER SKIN PROX 35W (STAPLE) ×2 IMPLANT
STRAP SAFETY 5IN WIDE (MISCELLANEOUS) ×2 IMPLANT
SUT DVC 2 QUILL PDO  T11 36X36 (SUTURE) ×1
SUT DVC 2 QUILL PDO T11 36X36 (SUTURE) ×1 IMPLANT
SUT SILK 0 (SUTURE) ×1
SUT SILK 0 30XBRD TIE 6 (SUTURE) ×1 IMPLANT
SUT V-LOC 90 ABS DVC 3-0 CL (SUTURE) ×2 IMPLANT
SUT VIC AB 1 CT1 36 (SUTURE) ×2 IMPLANT
SYR 20ML LL LF (SYRINGE) ×2 IMPLANT
SYR 30ML LL (SYRINGE) ×2 IMPLANT
SYR 50ML LL SCALE MARK (SYRINGE) ×4 IMPLANT
SYR BULB IRRIG 60ML STRL (SYRINGE) ×2 IMPLANT
TAPE MICROFOAM 4IN (TAPE) ×2 IMPLANT
TOWEL OR 17X26 4PK STRL BLUE (TOWEL DISPOSABLE) ×2 IMPLANT
TRAY FOLEY MTR SLVR 16FR STAT (SET/KITS/TRAYS/PACK) ×2 IMPLANT

## 2020-08-12 NOTE — Evaluation (Signed)
Physical Therapy Evaluation Patient Details Name: Savannah Wilkins MRN: [id number] DOB: Dec 26, 1950 Today's Date: 08/12/2020   History of Present Illness  70 y.o. female s/p L total hip (anterior approach) 7/28.  Clinical Impression  Pt received in Semi-Fowler's position and agreeable to therapy.  Pt reported that she is able to feel her L LE enough to perform bed-level exercises but states that it is still too numb to perform ambulation or mobility at this time.  Pt performed therex without much difficulty, however noted some tingling in the L foot when performing.  Pt has limited ROM as expected following surgery and will continue to progress as therapy continues during hospital stay.  Pt will benefit from skilled PT intervention to increase independence and safety with basic mobility in preparation for discharge to the venue listed below.       Follow Up Recommendations Home health PT;Supervision - Intermittent (Anticipate d/c home with HHPT unless other complications occur.)    Equipment Recommendations  None recommended by PT    Recommendations for Other Services       Precautions / Restrictions Precautions Precautions: Anterior Hip Restrictions Weight Bearing Restrictions: Yes LLE Weight Bearing: Weight bearing as tolerated      Mobility  Bed Mobility               General bed mobility comments: deferred due to pt reporting that she does not have feeling in her L LE.    Transfers                    Ambulation/Gait                Stairs            Wheelchair Mobility    Modified Rankin (Stroke Patients Only)       Balance Overall balance assessment:  (not assessed at this time.)                                           Pertinent Vitals/Pain Pain Assessment: 0-10 Pain Score: 2  Pain Location: At incision site. Pain Descriptors / Indicators: Aching Pain Intervention(s): Limited activity within patient's  tolerance;Monitored during session    [place] expects to be discharged to:: Private residence Living Arrangements: Spouse/significant other Available Help at Discharge: Family Type of Home: House Home Access: Stairs to enter;Ramped entrance Entrance Stairs-Rails: None Entrance Stairs-Number of Steps: 1 Home Layout: One level Home Equipment: Cane - single point;Grab bars - toilet Additional Comments: Has ramp in front enterance and 1 step to enter through garage. Has handles on toilet, but not an elevated seat. Reports she is ok with transfers on/off toliet using the handles    Prior Function Level of Independence: Independent with assistive device(s)         Comments: using SPC at baseline     Hand Dominance        Extremity/Trunk Assessment   Upper Extremity Assessment Upper Extremity Assessment: Defer to OT evaluation    Lower Extremity Assessment Lower Extremity Assessment: LLE deficits/detail LLE Deficits / Details: weakness s/p THA. LLE: Unable to fully assess due to pain       Communication   Communication: No difficulties  Cognition Arousal/Alertness: Awake/alert Behavior During Therapy: WFL for tasks assessed/performed Overall Cognitive Status: Within Functional Limits for tasks assessed  General Comments      Exercises Total Joint Exercises Ankle Circles/Pumps: AROM;Strengthening;Both;10 reps;Supine Quad Sets: AROM;Strengthening;Both;10 reps;Supine Gluteal Sets: AROM;Strengthening;Both;10 reps;Supine Hip ABduction/ADduction: AROM;Strengthening;Both;10 reps;Supine Other Exercises Other Exercises: Pt educated on roles of PT and services provided during hospital stay.  Pt also edcuated on importance of exercises and to implement during stay in between bouts of therapy.   Assessment/Plan    PT Assessment Patient needs continued PT services  PT Problem List Decreased  strength;Decreased range of motion;Decreased activity tolerance;Decreased mobility;Pain       PT Treatment Interventions DME instruction;Gait training;Stair training;Functional mobility training;Therapeutic activities;Therapeutic exercise;Balance training;Neuromuscular re-education    PT Goals (Current goals can be found in the Care Plan section)  Acute Rehab PT Goals Patient Stated Goal: to be mobile enough to go back to [place]. PT Goal Formulation: With patient Time For Goal Achievement: 08/26/20 Potential to Achieve Goals: Good    Frequency BID   Barriers to discharge        Co-evaluation               AM-PAC PT "6 Clicks" Mobility  Outcome Measure Help needed turning from your back to your side while in a flat bed without using bedrails?: A Little Help needed moving from lying on your back to sitting on the side of a flat bed without using bedrails?: A Little Help needed moving to and from a bed to a chair (including a wheelchair)?: Total Help needed standing up from a chair using your arms (e.g., wheelchair or bedside chair)?: Total Help needed to walk in hospital room?: Total Help needed climbing 3-5 steps with a railing? : Total 6 Click Score: 10    End of Session   Activity Tolerance: Patient tolerated treatment well Patient left: in bed;with call bell/phone within reach;with bed alarm set Nurse Communication: Mobility status PT Visit Diagnosis: Unsteadiness on feet (R26.81);Muscle weakness (generalized) (M62.81);Difficulty in walking, not elsewhere classified (R26.2);Pain Pain - Right/Left: Left Pain - part of body: Hip    Time: [id number] PT Time Calculation (min) (ACUTE ONLY): 21 min   Charges:   PT Evaluation $PT Eval Low Complexity: 1 Low PT Treatments $Therapeutic Exercise: 8-22 mins        [name], PT, DPT 08/12/20, 4:38 PM   [name] 08/12/2020, 4:38 PM

## 2020-08-12 NOTE — Plan of Care (Signed)

## 2020-08-12 NOTE — Transfer of Care (Signed)
Immediate Anesthesia Transfer of Care Note  Patient: Fantasha Shroff  Procedure(s) Performed: TOTAL HIP ARTHROPLASTY ANTERIOR APPROACH (Left: Hip)  Patient Location: PACU  Anesthesia Type:Spinal  Level of Consciousness: drowsy  Airway & Oxygen Therapy: Patient Spontanous Breathing and Patient connected to face mask oxygen  Post-op Assessment: Report given to RN and Post -op Vital signs reviewed and stable  Post vital signs: Reviewed and stable  Last Vitals:  Vitals Value Taken Time  BP 106/57 08/12/20 1405  Temp    Pulse 79 08/12/20 1407  Resp 12 08/12/20 1407  SpO2 98 % 08/12/20 1407  Vitals shown include unvalidated device data.  Last Pain:  Vitals:   08/12/20 1405  TempSrc:   PainSc: 0-No pain         Complications: No notable events documented.

## 2020-08-12 NOTE — H&P (Signed)
Chief Complaint  Patient presents with   Left Hip - Follow-up, Pain    History of the Present Illness: Savannah Wilkins is a 70 y.o. female here today.   The patient presents for history and physical prior to a left total hip arthroplasty scheduled for 08/12/2020. Prior x-rays from 05/24/2020 show severe left hip osteoarthritis with prior right total hip arthroplasty. There is complete loss of central joint space, extensive osteophytes of the medial and lateral femoral head. Prior lateral view of the hip from 12/03/2019 shows extensive inferior osteophytes on the lateral as well. Bone is intact. She is scheduled for a left total hip arthroplasty after failure of nonoperative measures, including pain medication, anti-inflammatory, and oral narcotics. She had right total hip arthroplasty done on 04/08/2020.  The patient locates her pain to the groin. She has numbness to the lateral thigh.   The patient states she is allergic to many antibiotics. She has an IVC filter.  I have reviewed past medical, surgical, social and family history, and allergies as documented in the EMR.  Past Medical History: Past Medical History:  Diagnosis Date   Degenerative joint disease   History of DVT (deep vein thrombosis)  Right leg popliteal   History of kidney stones   History of pulmonary embolism   Kidney stones   Lichen sclerosus et atrophicus   Lumbar spondylosis   Meralgia paresthetica, left lower limb   Osteoarthritis of left knee  Left knee, right hip, and right forefinger   Osteopenia   Restless legs syndrome  Hx of restless legs syndrome   Past Surgical History: Past Surgical History:  Procedure Laterality Date   ARTHROPLASTY HIP TOTAL Right 04/08/2020   CATARACT EXTRACTION   CHOLECYSTECTOMY   COMBINED HYSTEROSCOPY DIAGNOSTIC / D&C 02/10/2013   DCR of right eye   Left eye retinal tear repair   Multiple kidney stone surgeries   Right eye retinal tear repair   Right pinkie finger cyst  excision 01/23/2008   TONSILLECTOMY 1984   TUBAL LIGATION 1985   Past Family History: Family History  Problem Relation Age of Onset   Stroke Mother   Prostate cancer Father   Heart failure Father   No Known Problems Sister   No Known Problems Sister   Medications: Current Outpatient Medications Ordered in Epic  Medication Sig Dispense Refill   aspirin 81 MG EC tablet Take 81 mg by mouth once daily   clobetasoL (TEMOVATE) 0.05 % ointment Apply topically as directed Uses 2-3x/weekly 30 g 2   EMGALITY PEN 120 mg/mL PnIj ADMINISTER 1 ML UNDER THE SKIN EVERY 28 DAYS 1 mL 11   vit C/E/Zn/coppr/lutein/zeaxan (PRESERVISION AREDS-2 ORAL) Take 1 tablet by mouth 2 (two) times daily   No current Epic-ordered facility-administered medications on file.   Allergies: Allergies  Allergen Reactions   Ciprofloxacin Other (See Comments)  Caused severe tendonitis in feet   Levaquin [Levofloxacin] Other (See Comments)  Caused severe tendonitis in feet   Penicillins Rash  Caused generalized rash   Quinolones Other (See Comments)  Caused pain & cramping   Sulfa (Sulfonamide Antibiotics) Rash  Caused generalized rash    Body mass index is 37.81 kg/m.  Review of Systems: A comprehensive 14 point ROS was performed, reviewed, and the pertinent orthopaedic findings are documented in the HPI.  Vitals:  07/26/20 0807  BP: 130/84    General Physical Examination:   General/Constitutional: No apparent distress: well-nourished and well developed. Eyes: Pupils equal, round with synchronous movement. Lungs: Clear to  auscultation HEENT: Normal Vascular: No edema, swelling or tenderness, except as noted in detailed exam. Cardiac: Heart rate and rhythm is regular. Integumentary: No impressive skin lesions present, except as noted in detailed exam. Neuro/Psych: Normal mood and affect, oriented to person, place and time.  Musculoskeletal Examination:  On exam, good strength with bilateral hip  flexion and abduction. Left hip has 10 degrees internal rotation and 30 degrees external with pain in the groin. Numbness to the left lateral thigh. Trace edema to the left foot. Antalgic gait.   Radiographs:  No new imaging studies were obtained today.  Assessment: ICD-10-CM  1. Primary localized osteoarthritis of both hips M16.0  2. Morbid obesity with BMI of 40.0-44.9, adult (CMS-HCC) E66.01  Z68.41   Plan:  The patient has clinical findings of severe left hip osteoarthritis.  We discussed the patient's scheduled surgery. All her questions were answered. We will plan for left total hip arthroplasty in 2 weeks.  Surgical Risks:  The nature of the condition and the proposed procedure has been reviewed in detail with the patient. Surgical versus non-surgical options and prognosis for recovery have been reviewed and the inherent risks and benefits of each have been discussed including the risks of infection, bleeding, injury to nerves/blood vessels/tendons, incomplete relief of symptoms, persisting pain and/or stiffness, loss of function, complex regional pain syndrome, failure of the procedure, as appropriate.  Teeth: Natural  Attestation: I, Dawn Royse, am documenting for [facility], MD utilizing [place].    Electronically signed by Lauris Poag, MD at 07/27/2020 7:12 AM EDT  Reviewed  H+P. No changes noted.

## 2020-08-12 NOTE — Op Note (Signed)
08/12/2020  2:18 PM  PATIENT:  Catalina Gravel  70 y.o. female  PRE-OPERATIVE DIAGNOSIS:  Primary osteoarthritis of left hip M16.12  POST-OPERATIVE DIAGNOSIS:  Primary osteoarthritis of left hip M16.12  PROCEDURE:  Procedure(s): TOTAL HIP ARTHROPLASTY ANTERIOR APPROACH (Left)  SURGEON: Laurene Footman, MD  ASSISTANTS: none  ANESTHESIA:   spinal  EBL:  Total I/O In: 800 [I.V.:700; IV Piggyback:100] Out: 300 [Urine:150; Blood:150]  BLOOD ADMINISTERED:none  DRAINS:  Incisional wound VAC    LOCAL MEDICATIONS USED:  MARCAINE    and OTHER Exparel  SPECIMEN: Left femoral head  DISPOSITION OF SPECIMEN:  PATHOLOGY  COUNTS:  YES  TOURNIQUET:  * No tourniquets in log *  IMPLANTS: Medacta Master lock 5 lateralized stem with metal S 28 mm head 54 mmMpact TM cup and liner   DICTATION: .Dragon Dictation   The patient was brought to the operating room and after spinal anesthesia was obtained patient was placed on the operative table with the ipsilateral foot into the Medacta attachment, contralateral leg on a well-padded table. C-arm was brought in and preop template x-ray taken. After prepping and draping in usual sterile fashion appropriate patient identification and timeout procedures were completed. Anterior approach to the hip was obtained and centered over the greater trochanter and TFL muscle. The subcutaneous tissue was incised hemostasis being achieved by electrocautery. TFL fascia was incised and the muscle retracted laterally deep retractor placed. The lateral femoral circumflex vessels were identified and ligated. The anterior capsule was exposed and a capsulotomy performed. The neck was identified and a femoral neck cut carried out with a saw. The head was removed without difficulty and showed sclerotic femoral head and acetabulum. Reaming was carried out to 54 mm and a 54 mm cup trial gave appropriate tightness to the acetabular component a 54 DM cup was impacted into position. The  leg was then externally rotated and ischiofemoral and pubofemoral releases carried out. The femur was sequentially broached to a size 5, size 5 lateralized stem with S head trials were placed and the final components chosen. The 5 lateralized stem was inserted along with a metal S 28 mm head and 54 mm liner. The hip was reduced and was stable the wound was thoroughly irrigated with fibrillar placed along the posterior capsule and medial neck. The deep fascia ws closed using a heavy Quill after infiltration of 30 cc of quarter percent Sensorcaine with epinephrine mixed with Exparel throughout the case, .3-0 V-loc to close the skin with skin staples.  Incisional wound VAC applied and patient was sent to recovery in stable condition.  PLAN OF CARE: Admit to inpatient   PATIENT DISPOSITION:  PACU - hemodynamically stable.

## 2020-08-12 NOTE — Progress Notes (Signed)
Pt admitted to 1A131, husband at bedside. VSS  BP 139/77 (BP Location: Left Arm)   Pulse 61   Temp 97.8 F (36.6 C)   Resp 16   Ht '5\' 5"'$  (1.651 m)   Wt 102.1 kg   SpO2 96%   BMI 37.44 kg/m

## 2020-08-12 NOTE — Anesthesia Preprocedure Evaluation (Addendum)
Anesthesia Evaluation  Patient identified by MRN, date of birth, ID band Patient awake    Reviewed: Allergy & Precautions, H&P , NPO status , Patient's Chart, lab work & pertinent test results  History of Anesthesia Complications (+) PONV and history of anesthetic complications (tolerated previous hip surgery without nausea)  Airway Mallampati: III  TM Distance: <3 FB Neck ROM: full    Dental  (+) Poor Dentition   Pulmonary neg shortness of breath,  H/o pulmonary emboli 2015, now with IVC filter   Pulmonary exam normal        Cardiovascular Exercise Tolerance: Poor METS: 3 - Mets (-) angina(-) Past MI and (-) DOE Normal cardiovascular exam Rhythm:Regular     Neuro/Psych  Headaches (controlled on medication), negative psych ROS   GI/Hepatic Neg liver ROS, GERD  Controlled,  Endo/Other  Obesity  Renal/GU Renal disease (h/o kidney stones)     Musculoskeletal  (+) Arthritis , Osteoarthritis,    Abdominal (+) + obese,   Peds  Hematology negative hematology ROS (+)   Anesthesia Other Findings Past Medical History: No date: Arthritis 2015: DVT (deep venous thrombosis) ([place])     Comment:  pulmonary emboli No date: Headache No date: History of kidney stones No date: Lichen sclerosus [id number]: Pneumonia No date: PONV (postoperative nausea and vomiting) 2015: Pulmonary emboli ([place]) No date: Restless leg syndrome  Past Surgical History: 1996: CHOLECYSTECTOMY 2015: COLONOSCOPY 2010: CYST EXCISION; Right     Comment:  pinkie finger No date: dcr; Right     Comment:  right eye 2006: [place] LITHOTRIPSY; Bilateral     Comment:  twice on left and once on right 07/25/2018: EXTRACORPOREAL SHOCK WAVE LITHOTRIPSY; Right     Comment:  Procedure: EXTRACORPOREAL SHOCK WAVE LITHOTRIPSY (ESWL);              Surgeon: Billey Co, MD;  Location: [facility] ORS;                Service: Urology;  Laterality:  Right; 2013: EYE SURGERY; Bilateral     Comment:  cataract extractions 2015: HYSTEROSCOPY 04/06/2020: IVC FILTER INSERTION; N/A     Comment:  Procedure: IVC FILTER INSERTION;  Surgeon: Katha Cabal, MD;  Location: [facility];  Service:              Cardiovascular;  Laterality: N/A; 2006, 2013: kidney stone removal; Right     Comment:  right ureteroscopic stone removals No date: [place]; Bilateral     Comment:  RIGHT 2007 AND 2008; LEFT 2012 1984: TONSILLECTOMY 1985: TUBAL LIGATION  BMI    Body Mass Index: 39.61 kg/m      Reproductive/Obstetrics negative OB ROS                            Anesthesia Physical  Anesthesia Plan  ASA: 3  Anesthesia Plan: Spinal   Post-op Pain Management:    Induction: Intravenous  PONV Risk Score and Plan: 2 and TIVA  Airway Management Planned: Natural Airway and Nasal Cannula  Additional Equipment:   Intra-op Plan:   Post-operative Plan:   Informed Consent: I have reviewed the patients History and Physical, chart, labs and discussed the procedure including the risks, benefits and alternatives for the proposed anesthesia with the patient or authorized representative who has indicated his/her understanding and acceptance.  Dental Advisory Given  Plan Discussed with: Anesthesiologist, CRNA and Surgeon  Anesthesia Plan Comments: (Patient reports no bleeding problems and no anticoagulant use.  Plan for spinal with backup GA  Patient consented for risks of anesthesia including but not limited to:  - adverse reactions to medications - damage to eyes, teeth, lips or other oral mucosa - nerve damage due to positioning  - risk of bleeding, infection and or nerve damage from spinal that could lead to paralysis - risk of headache or failed spinal - damage to teeth, lips or other oral mucosa - sore throat or hoarseness - damage to heart, brain, nerves, lungs,  other parts of body or loss of life  Patient voiced understanding.)       Anesthesia Quick Evaluation

## 2020-08-12 NOTE — Anesthesia Procedure Notes (Signed)
Spinal  Patient location during procedure: OR Start time: 08/12/2020 12:10 PM End time: 08/12/2020 12:17 PM Reason for block: surgical anesthesia Staffing Performed: resident/CRNA  Resident/CRNA: Esaw Grandchild, CRNA Preanesthetic Checklist Completed: patient identified, IV checked, site marked, risks and benefits discussed, surgical consent, monitors and equipment checked, pre-op evaluation and timeout performed Spinal Block Patient position: sitting Prep: ChloraPrep Patient monitoring: continuous pulse ox, heart rate and blood pressure Approach: midline Location: L2-3 Injection technique: single-shot Needle Needle type: Pencan  Needle gauge: 24 G Needle length: 10 cm Assessment Sensory level: T6 Events: CSF return

## 2020-08-13 ENCOUNTER — Encounter: Payer: Self-pay | Admitting: Orthopedic Surgery

## 2020-08-13 LAB — BASIC METABOLIC PANEL
Anion gap: 5 (ref 5–15)
BUN: 13 mg/dL (ref 8–23)
CO2: 25 mmol/L (ref 22–32)
Calcium: 8.5 mg/dL — ABNORMAL LOW (ref 8.9–10.3)
Chloride: 108 mmol/L (ref 98–111)
Creatinine, Ser: 0.61 mg/dL (ref 0.44–1.00)
GFR, Estimated: >60 mL/min (ref >60)
Glucose, Bld: 138 mg/dL — ABNORMAL HIGH (ref 70–99)
Potassium: 3.9 mmol/L (ref 3.5–5.1)
Sodium: 138 mmol/L (ref 135–145)

## 2020-08-13 LAB — CBC
HCT: 33.5 % — ABNORMAL LOW (ref 36.0–46.0)
Hemoglobin: 11.1 g/dL — ABNORMAL LOW (ref 12.0–15.0)
MCH: 29.1 pg (ref 26.0–34.0)
MCHC: 33.1 g/dL (ref 30.0–36.0)
MCV: 87.9 fL (ref 80.0–100.0)
Platelets: 203 10*3/uL (ref 150–400)
RBC: 3.81 MIL/uL — ABNORMAL LOW (ref 3.87–5.11)
RDW: 14.5 % (ref 11.5–15.5)
WBC: 10.2 10*3/uL (ref 4.0–10.5)
nRBC: 0 % (ref 0.0–0.2)

## 2020-08-13 MED ORDER — TRAMADOL HCL 50 MG PO TABS: 50.0000 mg | ORAL_TABLET | Freq: Four times a day (QID) | ORAL | 0 refills | Status: DC | PRN

## 2020-08-13 MED ORDER — HYDROCODONE-ACETAMINOPHEN 5-325 MG PO TABS: 1.0000 | ORAL_TABLET | ORAL | 0 refills | Status: DC | PRN

## 2020-08-13 MED ORDER — METHOCARBAMOL 500 MG PO TABS
500.0000 mg | ORAL_TABLET | Freq: Four times a day (QID) | ORAL | 0 refills | Status: DC | PRN
Start: 2020-08-13 — End: 2020-10-20

## 2020-08-13 NOTE — Progress Notes (Signed)
Physical Therapy Treatment Patient Details Name: Savannah Wilkins MRN: [id number] DOB: [birth year] Today's Date: 08/13/2020    History of Present Illness 70 y.o. female s/p L total hip (anterior approach) 7/28.    PT Comments    Pt received in Semi-Fowler's position and agreeable to therapy.  Pt performed bed mobility with decrease assistance and is moving much more fluidly.  Pt was able to transfer with supervision into seated/standing position and only required minA for navigation of hemovac.  Pt then ambulated ~200 ft with heavy use of UE on FW in the hallway.  Pt required 2 standing rest breaks in which she reports she was starting to fatigue quickly.  Pt then arrived to room and was able to transfer safely and effectively into bed.  Pt still requires minA for navigation of the LE into the bed, however is moving much better during the PM session.  Current discharge plans to home with HHPT service remain appropriate at this time.  Pt will continue to benefit from skilled therapy in order to address deficits listed below.      Follow Up Recommendations  Home health PT;Supervision - Intermittent (Anticipate d/c home with HHPT unless other complications occur.)     Equipment Recommendations  None recommended by PT    Recommendations for Other Services       Precautions / Restrictions Precautions Precautions: Anterior Hip Restrictions Weight Bearing Restrictions: Yes LLE Weight Bearing: Weight bearing as tolerated    Mobility  Bed Mobility Overal bed mobility: Needs Assistance Bed Mobility: Supine to Sit;Sit to Supine     Supine to sit: Min assist Sit to supine: Min assist   General bed mobility comments: pt requires minA for navigating L LE into bed from seated position, and verbal cues to come into seated position.    Transfers Overall transfer level: Needs assistance Equipment used: Rolling walker (2 wheeled) Transfers: Sit to/from Stand Sit to Stand: Min assist          General transfer comment: Pt utilizes bilateral UE's through bed to come into standing.  Pt with heavy use of the UE's on the RW.  Ambulation/Gait Ambulation/Gait assistance: Min guard Gait Distance (Feet): 200 Feet Assistive device: Rolling walker (2 wheeled) Gait Pattern/deviations: Step-to pattern;Decreased step length - right;Decreased stance time - left;Decreased stride length;Antalgic Gait velocity: decreased   General Gait Details: Pt ambulates with slowed, yet safe, cadence.  Pt placed heavy weight through the B UEs.   Stairs             Wheelchair Mobility    Modified Rankin (Stroke Patients Only)       Balance Overall balance assessment: Needs assistance Sitting-balance support: No upper extremity supported Sitting balance-Leahy Scale: Good     Standing balance support: Bilateral upper extremity supported Standing balance-Leahy Scale: Fair                              Cognition Arousal/Alertness: Awake/alert Behavior During Therapy: WFL for tasks assessed/performed Overall Cognitive Status: Within Functional Limits for tasks assessed                                        Exercises Total Joint Exercises Ankle Circles/Pumps: AROM;Strengthening;Both;10 reps;Supine Quad Sets: AROM;Strengthening;Both;10 reps;Supine Gluteal Sets: AROM;Strengthening;Both;10 reps;Supine Hip ABduction/ADduction: AROM;Strengthening;Both;10 reps;Supine    General Comments  Pertinent Vitals/Pain Pain Assessment: Faces Faces Pain Scale: Hurts little more Pain Location: At incision site. Pain Descriptors / Indicators: Aching Pain Intervention(s): Limited activity within patient's tolerance;Monitored during session;Premedicated before session;Repositioned    Home Living                      Prior Function            PT Goals (current goals can now be found in the care plan section) Acute Rehab PT Goals Patient Stated  Goal: to be mobile enough to go back to [employer]. PT Goal Formulation: With patient Time For Goal Achievement: 08/26/20 Potential to Achieve Goals: Good Progress towards PT goals: Progressing toward goals    Frequency    BID      PT Plan      Co-evaluation              AM-PAC PT "6 Clicks" Mobility   Outcome Measure  Help needed turning from your back to your side while in a flat bed without using bedrails?: A Little Help needed moving from lying on your back to sitting on the side of a flat bed without using bedrails?: A Little Help needed moving to and from a bed to a chair (including a wheelchair)?: A Little Help needed standing up from a chair using your arms (e.g., wheelchair or bedside chair)?: A Little Help needed to walk in hospital room?: A Little Help needed climbing 3-5 steps with a railing? : A Little 6 Click Score: 18    End of Session Equipment Utilized During Treatment: Gait belt Activity Tolerance: Patient tolerated treatment well Patient left: in bed;with call bell/phone within reach;with bed alarm set Nurse Communication: Mobility status PT Visit Diagnosis: Unsteadiness on feet (R26.81);Muscle weakness (generalized) (M62.81);Difficulty in walking, not elsewhere classified (R26.2);Pain Pain - Right/Left: Left Pain - part of body: Hip     Time: [id number] PT Time Calculation (min) (ACUTE ONLY): 29 min  Charges:  $Gait Training: 23-37 mins $Therapeutic Exercise: 23-37 mins                     [name], PT, DPT 08/13/20, 5:03 PM    [name] 08/13/2020, 5:00 PM

## 2020-08-13 NOTE — Discharge Summary (Signed)
Physician Discharge Summary  Patient ID: Savannah Wilkins MRN: [id number] DOB/AGE: [birth year] 70 y.o.  Admit date: 08/12/2020 Discharge date: 08/13/2020  Admission Diagnoses:  Status post total hip replacement, left [Z96.642]   Discharge Diagnoses: Patient Active Problem List   Diagnosis Date Noted   Status post total hip replacement, left 08/12/2020   S/P hip replacement 04/08/2020   Restless legs 07/02/2018   Postmenopausal bleeding 07/02/2018   Lesion of vulva 07/02/2018   Nephrolithiasis 07/02/2018   Cataract 07/02/2018   Medicare annual wellness visit, initial 11/09/2017   Headache disorder 08/26/2017   Obesity (BMI 35.0-39.9 without comorbidity) [id number]   Lichen sclerosus [id number]   History of pulmonary embolus (PE) 07/09/2017   Chronic daily headache 07/09/2017   DVT of leg (deep venous thrombosis) ([facility] [place]) 08/17/2013   Hypoxia 08/16/2013   Pleuritic chest pain 08/15/2013   Acute pulmonary embolism ([place]) 08/15/2013   Arthropathy of pelvic region and thigh 07/03/2012   Arthritis, hip 07/03/2012    Past Medical History:  Diagnosis Date   Arthritis    DVT (deep venous thrombosis) ([place]) 2015   pulmonary emboli   Headache    History of kidney stones    Lichen sclerosus    Pneumonia 2015   PONV (postoperative nausea and vomiting)    Pulmonary emboli ([place]) 2015   Restless leg syndrome      Transfusion: none   Consultants (if any):   Discharged Condition: Improved  Hospital Course: Savannah Wilkins is an 70 y.o. female who was admitted 08/12/2020 with a diagnosis of left hip osteoarthritis and went to the operating room on 08/12/2020 and underwent the above named procedures.    Surgeries: Procedure(s): TOTAL HIP ARTHROPLASTY ANTERIOR APPROACH on 08/12/2020 Patient tolerated the surgery well. Taken to PACU where she was stabilized and then transferred to the orthopedic floor.  Started on Eliquis. Foot pumps applied bilaterally at 80 mm. Heels elevated on bed with  rolled towels. No evidence of DVT. Negative [place]. Physical therapy started on day #1 for gait training and transfer. OT started day #1 for ADL and assisted devices.  Patient's foley was d/c on day #1. Patient's IV  was d/c on day #2.  On post op day #2 patient was stable and ready for discharge to home with HHPT.  Implants:  Medacta Master lock 5 lateralized stem with metal S 28 mm head 54 mmMpact TM cup and liner   She was given perioperative antibiotics:  Anti-infectives (From admission, onward)    Start     Dose/Rate Route Frequency Ordered Stop   08/12/20 1700  ceFAZolin (ANCEF) IVPB 2g/100 mL premix        2 g 200 mL/hr over 30 Minutes Intravenous Every 6 hours 08/12/20 1548 08/13/20 0914   08/12/20 0744  ceFAZolin (ANCEF) 2-4 GM/100ML-% IVPB       Note to Pharmacy: Olena Mater   : cabinet override      08/12/20 0744 08/12/20 1237   08/12/20 0730  ceFAZolin (ANCEF) IVPB 2g/100 mL premix        2 g 200 mL/hr over 30 Minutes Intravenous On call to O.R. 08/12/20 [id number] 08/12/20 1245     .  She was given sequential compression devices, early ambulation, and ELiquis for DVT prophylaxis.  She benefited maximally from the hospital stay and there were no complications.    Recent vital signs:  Vitals:   08/13/20 0729 08/13/20 1155  BP: 108/66 114/66  Pulse: 63 62  Resp: 18 18  Temp: 98.1 F (  36.7 C) 98.6 F (37 C)  SpO2: 95% 95%    Recent laboratory studies:  Lab Results  Component Value Date   HGB 11.1 (L) 08/13/2020   HGB 13.5 08/03/2020   HGB 10.5 (L) 04/10/2020   Lab Results  Component Value Date   WBC 10.2 08/13/2020   PLT 203 08/13/2020   No results found for: INR Lab Results  Component Value Date   NA 138 08/13/2020   K 3.9 08/13/2020   CL 108 08/13/2020   CO2 25 08/13/2020   BUN 13 08/13/2020   CREATININE 0.61 08/13/2020   GLUCOSE 138 (H) 08/13/2020    Discharge Medications:   Allergies as of 08/13/2020       Reactions   Ciprofloxacin Other  (See Comments)   tendonitis   Penicillins Rash   Tolerated 2g Ancef   Quinolones Other (See Comments)   Caused pain & cramping Caused severe tendonitis in feet   Sulfa Antibiotics Rash        Medication List     STOP taking these medications    aspirin EC 81 MG tablet       TAKE these medications    apixaban 5 MG Tabs tablet Commonly known as: ELIQUIS Take 1 tablet (5 mg total) by mouth 2 (two) times daily.   clobetasol ointment 0.05 % Commonly known as: TEMOVATE Apply 1 application topically 2 (two) times a week. lichen sclerosus (Mondays & Fridays)   Emgality 120 MG/ML Soaj Generic drug: Galcanezumab-gnlm Inject 120 mg as directed every 30 (thirty) days.   HYDROcodone-acetaminophen 5-325 MG tablet Commonly known as: NORCO/VICODIN Take 1-2 tablets by mouth every 4 (four) hours as needed for moderate pain (pain score 4-6).   methocarbamol 500 MG tablet Commonly known as: ROBAXIN Take 1 tablet (500 mg total) by mouth every 6 (six) hours as needed for muscle spasms.   PRESERVISION AREDS 2 PO Take 1 tablet by mouth in the morning and at bedtime.   REFRESH OPTIVE OP Place 1 drop into both eyes 3 (three) times daily as needed (dry eyes).   traMADol 50 MG tablet Commonly known as: ULTRAM Take 1 tablet (50 mg total) by mouth every 6 (six) hours as needed.        Diagnostic Studies: DG HIP OPERATIVE UNILAT W OR W/O PELVIS LEFT  Result Date: 08/12/2020 CLINICAL DATA:  Left total hip arthroplasty. EXAM: OPERATIVE LEFT HIP (WITH PELVIS IF PERFORMED) TECHNIQUE: Fluoroscopic spot image(s) were submitted for interpretation post-operatively. COMPARISON:  None. FINDINGS: Four fluoroscopic spot views of the left hip were obtained during left hip arthroplasty. Fluoroscopy time 12 seconds. Total dose 5.2 mGy. IMPRESSION: Procedural fluoroscopy for left hip arthroplasty. Electronically Signed   By: Keith Rake M.D.   On: 08/12/2020 16:37   DG HIP UNILAT W OR W/O PELVIS  2-3 VIEWS LEFT  Result Date: 08/12/2020 CLINICAL DATA:  Patient status post left hip replacement today. EXAM: DG HIP (WITH OR WITHOUT PELVIS) 2-3V LEFT COMPARISON:  None. FINDINGS: A left total hip arthroplasty is in place. Gas in the soft tissues, surgical staples and drain are noted. No acute abnormality. IMPRESSION: Status post left hip replacement.  No acute abnormality. Electronically Signed   By: Inge Rise M.D.   On: 08/12/2020 15:15    Disposition: home with home health PT     Follow-up Information     Duanne Guess, PA-C Follow up in 2 week(s).   Specialties: Orthopedic Surgery, Emergency Medicine Contact information: [address]  [place] [address] [phone]                  Signed: Feliberto Gottron 08/13/2020, 12:44 PM

## 2020-08-13 NOTE — Progress Notes (Signed)
   Subjective: 1 Day Post-Op Procedure(s) (LRB): TOTAL HIP ARTHROPLASTY ANTERIOR APPROACH (Left) Patient reports pain as mild.   Patient is well, and has had no acute complaints or problems Denies any CP, SOB, ABD pain. We will continue therapy today.  Plan is to go Home after hospital stay.  Objective: Vital signs in last 24 hours: Temp:  [97.3 F (36.3 C)-98.5 F (36.9 C)] 98.1 F (36.7 C) (07/29 0729) Pulse Rate:  [61-80] 63 (07/29 0729) Resp:  [9-19] 18 (07/29 0729) BP: (101-139)/(57-81) 108/66 (07/29 0729) SpO2:  [93 %-99 %] 95 % (07/29 0729)  Intake/Output from previous day: 07/28 0701 - 07/29 0700 In: 2459.9 [P.O.:360; I.V.:1999.9; IV Piggyback:100] Out: 1200 [Urine:1050; Blood:150] Intake/Output this shift: No intake/output data recorded.  Recent Labs    08/13/20 0443  HGB 11.1*   Recent Labs    08/13/20 0443  WBC 10.2  RBC 3.81*  HCT 33.5*  PLT 203   Recent Labs    08/13/20 0443  NA 138  K 3.9  CL 108  CO2 25  BUN 13  CREATININE 0.61  GLUCOSE 138*  CALCIUM 8.5*   No results for input(s): LABPT, INR in the last 72 hours.  EXAM General - Patient is Alert, Appropriate, and Oriented Extremity - Neurovascular intact Sensation intact distally Intact pulses distally Dorsiflexion/Plantar flexion intact No cellulitis present Compartment soft Dressing - dressing C/D/I and no drainage, Praveena intact without drainage Motor Function - intact, moving foot and toes well on exam.   Past Medical History:  Diagnosis Date   Arthritis    DVT (deep venous thrombosis) ([facility]) 2015   pulmonary emboli   Headache    History of kidney stones    Lichen sclerosus    Pneumonia 2015   PONV (postoperative nausea and vomiting)    Pulmonary emboli ([facility]) 2015   Restless leg syndrome     Assessment/Plan:   1 Day Post-Op Procedure(s) (LRB): TOTAL HIP ARTHROPLASTY ANTERIOR APPROACH (Left) Active Problems:   Status post total hip replacement, left  Estimated  body mass index is 37.44 kg/m as calculated from the following:   Height as of this encounter: '5\' 5"'$  (1.651 m).   Weight as of this encounter: 102.1 kg. Advance diet Up with therapy Pain well controlled Labs and vital signs are stable Work on bowel movement Care manager to assist with discharge to home with home health PT pending progress with PT  DVT Prophylaxis - TED hose and SCDs, Eliquis Weight-Bearing as tolerated to left leg   T. Rachelle Hora, PA-C [place] 08/13/2020, 8:23 AM

## 2020-08-13 NOTE — Plan of Care (Signed)
°  Problem: Clinical Measurements: °Goal: Will remain free from infection °Outcome: Progressing °Goal: Respiratory complications will improve °Outcome: Progressing °Goal: Cardiovascular complication will be avoided °Outcome: Progressing °  °Problem: Activity: °Goal: Risk for activity intolerance will decrease °Outcome: Progressing °  °Problem: Nutrition: °Goal: Adequate nutrition will be maintained °Outcome: Progressing °  °Problem: Coping: °Goal: Level of anxiety will decrease °Outcome: Progressing °  °Problem: Elimination: °Goal: Will not experience complications related to bowel motility °Outcome: Progressing °Goal: Will not experience complications related to urinary retention °Outcome: Progressing °  °Problem: Pain Managment: °Goal: General experience of comfort will improve °Outcome: Progressing °  °Problem: Safety: °Goal: Ability to remain free from injury will improve °Outcome: Progressing °  °Problem: Skin Integrity: °Goal: Risk for impaired skin integrity will decrease °Outcome: Progressing °  °

## 2020-08-13 NOTE — Progress Notes (Signed)
Vitals entered manually ° °

## 2020-08-13 NOTE — Anesthesia Postprocedure Evaluation (Signed)
Anesthesia Post Note  Patient: Savannah Wilkins  Procedure(s) Performed: TOTAL HIP ARTHROPLASTY ANTERIOR APPROACH (Left: Hip)  Patient location during evaluation: Nursing Unit Anesthesia Type: Spinal Level of consciousness: oriented and awake and alert Pain management: pain level controlled Vital Signs Assessment: post-procedure vital signs reviewed and stable Respiratory status: spontaneous breathing and respiratory function stable Cardiovascular status: blood pressure returned to baseline and stable Postop Assessment: no headache, no backache and no apparent nausea or vomiting Anesthetic complications: no   No notable events documented.   Last Vitals:  Vitals:   08/13/20 0429 08/13/20 0729  BP: 114/64 108/66  Pulse: 77 63  Resp: 18 18  Temp: 36.4 C 36.7 C  SpO2: 93% 95%    Last Pain:  Vitals:   08/13/20 0412  TempSrc:   PainSc: Asleep                 Norm Salt

## 2020-08-13 NOTE — Evaluation (Signed)
Occupational Therapy Evaluation Patient Details Name: Savannah Wilkins MRN: [id number] DOB: 02-05-1950 Today's Date: 08/13/2020    History of Present Illness 70 y.o. female s/p L total hip (anterior approach) 7/28.   Clinical Impression   Pt seen for OT evaluation this date, POD#1 from above surgery. Pt was independent in all ADLs prior to surgery, however having more pain and difficulty with prolonged standing and walking due to L hip pain. Pt is eager to return to PLOF with less pain and improved safety and independence. Pt currently requires MIN assist for LB dressing and bathing while in seated position due to pain and limited AROM of L hip. Pt reports just back to bed from PT session and declined OOB again for OT 2/2 pain and fatigue. Pt instructed in self care skills, falls prevention strategies, home/routines modifications, DME/AE for LB bathing and dressing tasks, and compression stocking mgt strategies. Pt verbalized understanding and provided with a handout provided to support recall and carryover. Pt would benefit from additional instruction in self care skills and techniques to help maintain precautions with or without assistive devices to support recall and carryover prior to discharge. Do not anticipate skilled OT needs beyond discharge at this time.      Follow Up Recommendations  No OT follow up    Equipment Recommendations  None recommended by OT    Recommendations for Other Services       Precautions / Restrictions Precautions Precautions: Anterior Hip Precaution Booklet Issued: Yes (comment) Restrictions Weight Bearing Restrictions: Yes LLE Weight Bearing: Weight bearing as tolerated      Mobility Bed Mobility    General bed mobility comments: pt declined, just back to bed after PT    Transfers          General transfer comment: pt declined, just back to bed after PT    Balance                              ADL either performed or assessed  with clinical judgement   ADL Overall ADL's : Needs assistance/impaired                                       General ADL Comments: Pt requires MIN A for LB ADL tasks, spouse able to provide per pt, has AE at home     Vision         Perception     Praxis      Pertinent Vitals/Pain Pain Assessment: 0-10 Pain Score: 4  Faces Pain Scale: Hurts little more Pain Location: At incision site. Pain Descriptors / Indicators: Aching Pain Intervention(s): Limited activity within patient's tolerance;Monitored during session;Repositioned;RN gave pain meds during session     Hand Dominance     Extremity/Trunk Assessment Upper Extremity Assessment Upper Extremity Assessment: Overall WFL for tasks assessed   Lower Extremity Assessment Lower Extremity Assessment: LLE deficits/detail LLE Deficits / Details: weakness s/p THA. LLE: Unable to fully assess due to pain       Communication Communication Communication: No difficulties   Cognition Arousal/Alertness: Awake/alert Behavior During Therapy: WFL for tasks assessed/performed Overall Cognitive Status: Within Functional Limits for tasks assessed  General Comments       Exercises  Other Exercises: Pt educated in home/routines modifications, comperssion stocking mgt, falls prevention, , precautions, and AE/DME; handout provided   Shoulder Instructions      Home Living Family/patient expects to be discharged to:: Private residence Living Arrangements: Spouse/significant other Available Help at Discharge: Family Type of Home: House Home Access: Stairs to enter;Ramped entrance Entrance Stairs-Number of Steps: 1 Entrance Stairs-Rails: None Home Layout: One level         Biochemist, clinical: [place] - single point;Grab bars - toilet   Additional Comments: Has ramp in front enterance and 1 step to enter through garage. Has handles on  toilet, but not an elevated seat. Reports she is ok with transfers on/off toliet using the handles      Prior Functioning/Environment Level of Independence: Independent with assistive device(s)        Comments: using SPC at baseline        OT Problem List: Decreased strength;Pain      OT Treatment/Interventions:      OT Goals(Current goals can be found in the care plan section) Acute Rehab OT Goals Patient Stated Goal: to be mobile enough to go back to [place]. OT Goal Formulation: With patient Time For Goal Achievement: 08/27/20 Potential to Achieve Goals: Good ADL Goals Pt Will Perform Lower Body Dressing: with modified independence;with adaptive equipment;sit to/from stand Pt Will Transfer to Toilet: with supervision;ambulating;bedside commode (c LRAD) Additional ADL Goal #1: Pt will independently instruct family in copmression stocking mgt  OT Frequency:     Barriers to D/C:            Co-evaluation              AM-PAC OT "6 Clicks" Daily Activity     Outcome Measure Help from another person eating meals?: None Help from another person taking care of personal grooming?: None Help from another person toileting, which includes using toliet, bedpan, or urinal?: A Little Help from another person bathing (including washing, rinsing, drying)?: A Little Help from another person to put on and taking off regular upper body clothing?: None Help from another person to put on and taking off regular lower body clothing?: A Little 6 Click Score: 21   End of Session    Activity Tolerance: Patient limited by fatigue Patient left: in bed;with call bell/phone within reach;with bed alarm set  OT Visit Diagnosis: Other abnormalities of gait and mobility (R26.89);Pain Pain - Right/Left: Left Pain - part of body: Hip                Time: [id number] OT Time Calculation (min): 10 min Charges:  OT General Charges $OT Visit: 1 Visit OT Evaluation $OT Eval Low Complexity:  1 Low OT Treatments $Self Care/Home Management : 8-22 mins  Hanley Hays, MPH, MS, OTR/L ascom [phone] 08/13/20, 5:24 PM

## 2020-08-13 NOTE — Progress Notes (Signed)
Physical Therapy Treatment Patient Details Name: Savannah Wilkins MRN: [id number] DOB: 05-Aug-1950 Today's Date: 08/13/2020    History of Present Illness 70 y.o. female s/p L total hip (anterior approach) 7/28.    PT Comments    Pt received upright in recliner upon arrival to room and agreeable to therapy.  Pt notes that she has been feeling larger amounts of pain since initial evaluation and would like to have pain medications following treatment.  PT agreed to notify nursing following the session.  Pt was able to come into standing with use of the UE's pushing through the arm rests and than standing with RW.  Pt required two attempts, but once up, she stated she was stable.  Pt did have mild dizziness upon standing that resolved quickly.  Pt then proceeded to navigate room with RW and ambulate around the small nursing pod before returning to room.  Pt requested to go back to her bed due to the recliner hurting her hip.  Pt transferred back to bed with good technique requiring minA for leg navigation into the bed.  Current discharge plans to HHPT remain appropriate at this time.  Pt will continue to benefit from skilled therapy in order to address deficits listed below.     Follow Up Recommendations  Home health PT;Supervision - Intermittent (Anticipate d/c home with HHPT unless other complications occur.)     Equipment Recommendations  None recommended by PT    Recommendations for Other Services       Precautions / Restrictions Precautions Precautions: Anterior Hip Restrictions Weight Bearing Restrictions: Yes LLE Weight Bearing: Weight bearing as tolerated    Mobility  Bed Mobility Overal bed mobility: Needs Assistance Bed Mobility: Supine to Sit;Sit to Supine     Supine to sit: Min assist Sit to supine: Min assist   General bed mobility comments: pt requires minA for navigating L LE into bed from seated position, and verbal cues to come into seated position.     Transfers Overall transfer level: Needs assistance Equipment used: Rolling walker (2 wheeled) Transfers: Sit to/from Stand Sit to Stand: Min assist         General transfer comment: Pt utilizes bilateral UE's through bed to come into standing.  Pt with heavy use of the UE's on the RW.  Ambulation/Gait Ambulation/Gait assistance: Min guard Gait Distance (Feet): 120 Feet Assistive device: Rolling walker (2 wheeled) Gait Pattern/deviations: Step-to pattern;Decreased step length - right;Decreased stance time - left;Decreased stride length;Antalgic Gait velocity: decreased   General Gait Details: Pt ambulates with slowed, yet safe, cadence.  Pt placed heavy weight through the B UEs.   Stairs             Wheelchair Mobility    Modified Rankin (Stroke Patients Only)       Balance Overall balance assessment: Needs assistance Sitting-balance support: No upper extremity supported Sitting balance-Leahy Scale: Good     Standing balance support: Bilateral upper extremity supported Standing balance-Leahy Scale: Fair                              Cognition Arousal/Alertness: Awake/alert Behavior During Therapy: WFL for tasks assessed/performed Overall Cognitive Status: Within Functional Limits for tasks assessed                                        Exercises Total  Joint Exercises Ankle Circles/Pumps: AROM;Strengthening;Both;10 reps;Supine Quad Sets: AROM;Strengthening;Both;10 reps;Supine Gluteal Sets: AROM;Strengthening;Both;10 reps;Supine Hip ABduction/ADduction: AROM;Strengthening;Both;10 reps;Supine    General Comments        Pertinent Vitals/Pain Pain Assessment: Faces Faces Pain Scale: Hurts even more Pain Location: At incision site. Pain Descriptors / Indicators: Aching Pain Intervention(s): Limited activity within patient's tolerance;Monitored during session;Repositioned;Patient requesting pain meds-RN notified    Home  Living                      Prior Function            PT Goals (current goals can now be found in the care plan section) Acute Rehab PT Goals Patient Stated Goal: to be mobile enough to go back to [place]. PT Goal Formulation: With patient Time For Goal Achievement: 08/26/20 Potential to Achieve Goals: Good Progress towards PT goals: Progressing toward goals    Frequency    BID      PT Plan      Co-evaluation              AM-PAC PT "6 Clicks" Mobility   Outcome Measure  Help needed turning from your back to your side while in a flat bed without using bedrails?: A Little Help needed moving from lying on your back to sitting on the side of a flat bed without using bedrails?: A Little Help needed moving to and from a bed to a chair (including a wheelchair)?: A Little Help needed standing up from a chair using your arms (e.g., wheelchair or bedside chair)?: A Little Help needed to walk in hospital room?: A Little Help needed climbing 3-5 steps with a railing? : A Lot 6 Click Score: 17    End of Session Equipment Utilized During Treatment: Gait belt Activity Tolerance: Patient tolerated treatment well Patient left: in bed;with call bell/phone within reach;with bed alarm set Nurse Communication: Mobility status PT Visit Diagnosis: Unsteadiness on feet (R26.81);Muscle weakness (generalized) (M62.81);Difficulty in walking, not elsewhere classified (R26.2);Pain Pain - Right/Left: Left Pain - part of body: Hip     Time: [id number] PT Time Calculation (min) (ACUTE ONLY): 38 min  Charges:  $Gait Training: 8-22 mins $Therapeutic Exercise: 23-37 mins                     Savannah Wilkins, PT, DPT 08/13/20, 1:54 PM    Savannah Wilkins 08/13/2020, 1:50 PM

## 2020-08-13 NOTE — Discharge Instructions (Signed)

## 2020-08-13 NOTE — TOC Progression Note (Signed)
Transition of Care [facility]) - Progression Note    Patient Details  Name: Savannah Wilkins MRN: [id number] Date of Birth: 05/07/1950  Transition of Care [facility]) CM/SW [place], RN Phone Number: 08/13/2020, 11:14 AM  Clinical Narrative:            Expected Discharge Plan and Services                                                 Social Determinants of Health (SDOH) Interventions    Readmission Risk Interventions No flowsheet data found.

## 2020-08-13 NOTE — TOC Progression Note (Signed)
Transition of Care [facility]) - Progression Note    Patient Details  Name: Savannah Wilkins MRN: [id number] Date of Birth: [birth year]  Transition of Care [facility]) CM/SW [place], RN Phone Number: 08/13/2020, 11:05 AM  Clinical Narrative:   Patient lives at home with husband.  Husband can assist at discharge, as well as transport patient to appointments, pick up medications and other home needs.  Patient is on list for [place] with [place], as confirmed by [place].  Patient has no concerns about returning home.         Expected Discharge Plan and Services                                                 Social Determinants of Health (SDOH) Interventions    Readmission Risk Interventions No flowsheet data found.

## 2020-08-14 LAB — CBC
HCT: 30.4 % — ABNORMAL LOW (ref 36.0–46.0)
Hemoglobin: 9.9 g/dL — ABNORMAL LOW (ref 12.0–15.0)
MCH: 29 pg (ref 26.0–34.0)
MCHC: 32.6 g/dL (ref 30.0–36.0)
MCV: 89.1 fL (ref 80.0–100.0)
Platelets: 183 10*3/uL (ref 150–400)
RBC: 3.41 MIL/uL — ABNORMAL LOW (ref 3.87–5.11)
RDW: 14.7 % (ref 11.5–15.5)
WBC: 8.4 10*3/uL (ref 4.0–10.5)
nRBC: 0 % (ref 0.0–0.2)

## 2020-08-14 NOTE — Progress Notes (Signed)
Physical Therapy Treatment Patient Details Name: Savannah Wilkins MRN: [id number] DOB: 12-06-1950 Today's Date: 08/14/2020    History of Present Illness 70 y.o. female s/p L total hip (anterior approach) 7/28.    PT Comments    Pt seen for PT tx with pt requiring cuing for technique but able to complete supine>sit with supervision & reliance on HOB elevated & bed rails. Pt c/o intermittent sharp, shooting pain in L thigh - monitored & repositioned PRN. Pt completes sit<>stand transfers & ambulates around nursing pod with RW & supervision with decreased gait speed & pt noting 6/10 fatigue with task. Pt toilets without physical assistance and performs LLE strengthening exercises with instructional cuing for technique. Pt is very eager to d/c home today & denies any concerns re: d/c. Notified PA of pt's wishes & from a PT standpoint is safe to d/c home with supervision.  Of note, pt reports she has a ramp she plans to use to access house (vs singe step of 3").    Follow Up Recommendations  Home health PT;Supervision - Intermittent     Equipment Recommendations  None recommended by PT (pt already has RW)    Recommendations for Other Services       Precautions / Restrictions Precautions Precautions: Anterior Hip Restrictions Weight Bearing Restrictions: Yes LLE Weight Bearing: Weight bearing as tolerated    Mobility  Bed Mobility Overal bed mobility: Needs Assistance Bed Mobility: Supine to Sit     Supine to sit: Supervision;HOB elevated     General bed mobility comments: cuing for technique    Transfers Overall transfer level: Needs assistance Equipment used: Rolling walker (2 wheeled) Transfers: Sit to/from Stand Sit to Stand: Supervision            Ambulation/Gait Ambulation/Gait assistance: Supervision Gait Distance (Feet): 125 Feet (+ 15 ft) Assistive device: Rolling walker (2 wheeled) Gait Pattern/deviations: Step-to pattern;Decreased step length -  right;Decreased stance time - left;Decreased stride length;Decreased weight shift to left Gait velocity: decreased       Stairs             Wheelchair Mobility    Modified Rankin (Stroke Patients Only)       Balance Overall balance assessment: Needs assistance Sitting-balance support: No upper extremity supported Sitting balance-Leahy Scale: Good     Standing balance support: Bilateral upper extremity supported;During functional activity Standing balance-Leahy Scale: Fair                              Cognition Arousal/Alertness: Awake/alert Behavior During Therapy: WFL for tasks assessed/performed Overall Cognitive Status: Within Functional Limits for tasks assessed                                        Exercises General Exercises - Lower Extremity Long Arc Quad: AROM;Strengthening;Both;10 reps;Seated Hip Flexion/Marching: AROM;Strengthening;Left;10 reps;Seated    General Comments General comments (skin integrity, edema, etc.): Pt uses restroom with continent void, does not require physical assistance for toileting. Education re: squaring up to sink with RW.      Pertinent Vitals/Pain Pain Assessment: Faces Faces Pain Scale: Hurts little more Pain Location: L thigh, intermittent Pain Descriptors / Indicators: Shooting Pain Intervention(s): Monitored during session;Repositioned;Limited activity within patient's tolerance    Home Living  Prior Function            PT Goals (current goals can now be found in the care plan section) Acute Rehab PT Goals Patient Stated Goal: to be mobile enough to go back to [employer]. PT Goal Formulation: With patient Time For Goal Achievement: 08/26/20 Potential to Achieve Goals: Good Progress towards PT goals: Progressing toward goals    Frequency    BID      PT Plan Current plan remains appropriate    Co-evaluation              AM-PAC PT "6  Clicks" Mobility   Outcome Measure  Help needed turning from your back to your side while in a flat bed without using bedrails?: None Help needed moving from lying on your back to sitting on the side of a flat bed without using bedrails?: A Little Help needed moving to and from a bed to a chair (including a wheelchair)?: A Little Help needed standing up from a chair using your arms (e.g., wheelchair or bedside chair)?: A Little Help needed to walk in hospital room?: A Little Help needed climbing 3-5 steps with a railing? : A Little 6 Click Score: 19    End of Session Equipment Utilized During Treatment: Gait belt Activity Tolerance: Patient tolerated treatment well Patient left: in chair;with chair alarm set;with call bell/phone within reach Nurse Communication: Mobility status PT Visit Diagnosis: Unsteadiness on feet (R26.81);Muscle weakness (generalized) (M62.81);Difficulty in walking, not elsewhere classified (R26.2);Pain Pain - Right/Left: Left Pain - part of body: Hip     Time: [id number] PT Time Calculation (min) (ACUTE ONLY): 23 min  Charges:  $Therapeutic Activity: 23-37 mins                     Savannah Wilkins, PT, DPT 08/14/20, 10:01 AM    Waunita Schooner 08/14/2020, 9:59 AM

## 2020-08-14 NOTE — Progress Notes (Signed)
Patient is A & O x4 and able to make needs known. AVS reviewed with patient who verbalized understanding via teach back re medications, follow up appointments, signs and symptoms to report MD as well as limitations and restrictions. Printed prescription given to patient for her Hydrocodone-acetaminophen, methocarbamol and tramadol. Patient spouse will transport home via private vehicle.

## 2020-08-14 NOTE — Progress Notes (Signed)
  Subjective: 2 Days Post-Op Procedure(s) (LRB): TOTAL HIP ARTHROPLASTY ANTERIOR APPROACH (Left) Patient reports pain as well-controlled.   Patient is well, but has had some minor complaints of lateral thigh pain. Plan is to go Home after hospital stay. Negative for chest pain and shortness of breath Fever: no Gastrointestinal: negative for nausea and vomiting.  Patient has not had a bowel movement.  Objective: Vital signs in last 24 hours: Temp:  [98 F (36.7 C)-98.6 F (37 C)] 98.1 F (36.7 C) (07/30 0739) Pulse Rate:  [62-76] 76 (07/30 0739) Resp:  [16-18] 16 (07/30 0739) BP: (110-136)/(58-77) 119/58 (07/30 0739) SpO2:  [94 %-97 %] 97 % (07/30 0739)  Intake/Output from previous day:  Intake/Output Summary (Last 24 hours) at 08/14/2020 1049 Last data filed at 08/14/2020 1023 Gross per 24 hour  Intake 1117.67 ml  Output --  Net 1117.67 ml    Intake/Output this shift: Total I/O In: 240 [P.O.:240] Out: -   Labs: Recent Labs    08/13/20 0443 08/14/20 0554  HGB 11.1* 9.9*   Recent Labs    08/13/20 0443 08/14/20 0554  WBC 10.2 8.4  RBC 3.81* 3.41*  HCT 33.5* 30.4*  PLT 203 183   Recent Labs    08/13/20 0443  NA 138  K 3.9  CL 108  CO2 25  BUN 13  CREATININE 0.61  GLUCOSE 138*  CALCIUM 8.5*   No results for input(s): LABPT, INR in the last 72 hours.   EXAM General - Patient is Alert, Appropriate, and Oriented Extremity - Neurovascular intact Dorsiflexion/Plantar flexion intact Compartment soft Dressing/Incision -Prevena in place, no drainage  Motor Function - intact, moving foot and toes well on exam.     Gastrointestinal- soft, nontender, and active bowel sounds   Assessment/Plan: 2 Days Post-Op Procedure(s) (LRB): TOTAL HIP ARTHROPLASTY ANTERIOR APPROACH (Left) Active Problems:   Status post total hip replacement, left  Estimated body mass index is 37.44 kg/m as calculated from the following:   Height as of this encounter: '5\' 5"'$  (1.651  m).   Weight as of this encounter: 102.1 kg. Advance diet Up with therapy Discharge home with home health  Will work on BM. Spoke to [employer] unit attached. Honeycomb dressing given to patient.     DVT Prophylaxis - Eliquis, Ted hose, and foot pumps Weight-Bearing as tolerated to left leg  Cassell Smiles, PA-C [facility] 08/14/2020, 10:49 AM

## 2020-08-14 NOTE — Plan of Care (Signed)
  Problem: Education: Goal: Knowledge of General Education information will improve Description: Including pain rating scale, medication(s)/side effects and non-pharmacologic comfort measures 08/14/2020 1329 by Quintella Reichert, LPN Outcome: Adequate for Discharge 08/14/2020 1329 by Quintella Reichert, LPN Outcome: Adequate for Discharge   Problem: Health Behavior/Discharge Planning: Goal: Ability to manage health-related needs will improve 08/14/2020 1329 by Quintella Reichert, LPN Outcome: Adequate for Discharge 08/14/2020 1329 by Quintella Reichert, LPN Outcome: Adequate for Discharge   Problem: Clinical Measurements: Goal: Ability to maintain clinical measurements within normal limits will improve 08/14/2020 1329 by Quintella Reichert, LPN Outcome: Adequate for Discharge 08/14/2020 1329 by Quintella Reichert, LPN Outcome: Adequate for Discharge Goal: Will remain free from infection 08/14/2020 1329 by Quintella Reichert, LPN Outcome: Adequate for Discharge 08/14/2020 1329 by Quintella Reichert, LPN Outcome: Adequate for Discharge Goal: Diagnostic test results will improve 08/14/2020 1329 by Quintella Reichert, LPN Outcome: Adequate for Discharge 08/14/2020 1329 by Quintella Reichert, LPN Outcome: Adequate for Discharge Goal: Respiratory complications will improve 08/14/2020 1329 by Quintella Reichert, LPN Outcome: Adequate for Discharge 08/14/2020 1329 by Quintella Reichert, LPN Outcome: Adequate for Discharge Goal: Cardiovascular complication will be avoided 08/14/2020 1329 by Quintella Reichert, LPN Outcome: Adequate for Discharge 08/14/2020 1329 by Quintella Reichert, LPN Outcome: Adequate for Discharge   Problem: Activity: Goal: Risk for activity intolerance will decrease 08/14/2020 1329 by Quintella Reichert, LPN Outcome: Adequate for Discharge 08/14/2020 1329 by Quintella Reichert, LPN Outcome: Adequate for Discharge   Problem: Nutrition: Goal: Adequate nutrition will be maintained 08/14/2020 1329 by Quintella Reichert, LPN Outcome: Adequate for  Discharge 08/14/2020 1329 by Quintella Reichert, LPN Outcome: Adequate for Discharge   Problem: Coping: Goal: Level of anxiety will decrease 08/14/2020 1329 by Quintella Reichert, LPN Outcome: Adequate for Discharge 08/14/2020 1329 by Quintella Reichert, LPN Outcome: Adequate for Discharge   Problem: Elimination: Goal: Will not experience complications related to bowel motility 08/14/2020 1329 by Quintella Reichert, LPN Outcome: Adequate for Discharge 08/14/2020 1329 by Quintella Reichert, LPN Outcome: Adequate for Discharge Goal: Will not experience complications related to urinary retention 08/14/2020 1329 by Quintella Reichert, LPN Outcome: Adequate for Discharge 08/14/2020 1329 by Quintella Reichert, LPN Outcome: Adequate for Discharge   Problem: Pain Managment: Goal: General experience of comfort will improve 08/14/2020 1329 by Quintella Reichert, LPN Outcome: Adequate for Discharge 08/14/2020 1329 by Quintella Reichert, LPN Outcome: Adequate for Discharge   Problem: Safety: Goal: Ability to remain free from injury will improve 08/14/2020 1329 by Quintella Reichert, LPN Outcome: Adequate for Discharge 08/14/2020 1329 by Quintella Reichert, LPN Outcome: Adequate for Discharge   Problem: Skin Integrity: Goal: Risk for impaired skin integrity will decrease 08/14/2020 1329 by Quintella Reichert, LPN Outcome: Adequate for Discharge 08/14/2020 1329 by Quintella Reichert, LPN Outcome: Adequate for Discharge

## 2020-08-16 LAB — SURGICAL PATHOLOGY

## 2020-08-17 ENCOUNTER — Encounter: Payer: Self-pay | Admitting: Orthopedic Surgery

## 2020-08-17 MED ORDER — SODIUM CHLORIDE 0.9 % IR SOLN: Status: DC | PRN

## 2020-08-17 MED ORDER — HEMOSTATIC AGENTS (NO CHARGE) OPTIME
TOPICAL | Status: DC | PRN
  Administered 2020-08-12: 2 via TOPICAL

## 2020-09-16 DIAGNOSIS — K567 Ileus, unspecified: Secondary | ICD-10-CM

## 2020-09-16 HISTORY — DX: Ileus, unspecified: K56.7

## 2020-09-19 ENCOUNTER — Emergency Department: Payer: Medicare PPO

## 2020-09-19 ENCOUNTER — Encounter: Payer: Self-pay | Admitting: Emergency Medicine

## 2020-09-19 ENCOUNTER — Other Ambulatory Visit: Payer: Self-pay

## 2020-09-19 ENCOUNTER — Emergency Department
Admission: EM | Admit: 2020-09-19 | Discharge: 2020-09-19 | Disposition: A | Discharge status: Home / Self Care | Payer: Medicare PPO | Attending: Emergency Medicine | Admitting: Emergency Medicine

## 2020-09-19 DIAGNOSIS — Z96643 Presence of artificial hip joint, bilateral: Secondary | ICD-10-CM | POA: Insufficient documentation

## 2020-09-19 DIAGNOSIS — M25511 Pain in right shoulder: Secondary | ICD-10-CM | POA: Insufficient documentation

## 2020-09-19 DIAGNOSIS — R0781 Pleurodynia: Secondary | ICD-10-CM | POA: Diagnosis not present

## 2020-09-19 DIAGNOSIS — Z7901 Long term (current) use of anticoagulants: Secondary | ICD-10-CM | POA: Insufficient documentation

## 2020-09-19 DIAGNOSIS — M25512 Pain in left shoulder: Secondary | ICD-10-CM | POA: Diagnosis not present

## 2020-09-19 DIAGNOSIS — K567 Ileus, unspecified: Secondary | ICD-10-CM | POA: Diagnosis not present

## 2020-09-19 DIAGNOSIS — Z20822 Contact with and (suspected) exposure to covid-19: Secondary | ICD-10-CM | POA: Diagnosis not present

## 2020-09-19 DIAGNOSIS — R1011 Right upper quadrant pain: Secondary | ICD-10-CM | POA: Diagnosis present

## 2020-09-19 LAB — CBC
HCT: 41.2 % (ref 36.0–46.0)
Hemoglobin: 13.5 g/dL (ref 12.0–15.0)
MCH: 29.9 pg (ref 26.0–34.0)
MCHC: 32.8 g/dL (ref 30.0–36.0)
MCV: 91.4 fL (ref 80.0–100.0)
Platelets: 285 10*3/uL (ref 150–400)
RBC: 4.51 MIL/uL (ref 3.87–5.11)
RDW: 14.7 % (ref 11.5–15.5)
WBC: 10.2 10*3/uL (ref 4.0–10.5)
nRBC: 0 % (ref 0.0–0.2)

## 2020-09-19 LAB — HEPATIC FUNCTION PANEL
ALT: 11 U/L (ref 0–44)
AST: 17 U/L (ref 15–41)
Albumin: 4.2 g/dL (ref 3.5–5.0)
Alkaline Phosphatase: 110 U/L (ref 38–126)
Bilirubin, Direct: <0.1 mg/dL (ref 0.0–0.2)
Total Bilirubin: 0.5 mg/dL (ref 0.3–1.2)
Total Protein: 7.1 g/dL (ref 6.5–8.1)

## 2020-09-19 LAB — RESP PANEL BY RT-PCR (FLU A&B, COVID) ARPGX2
Influenza A by PCR: NEGATIVE
Influenza B by PCR: NEGATIVE
SARS Coronavirus 2 by RT PCR: NEGATIVE

## 2020-09-19 LAB — BASIC METABOLIC PANEL
Anion gap: 7 (ref 5–15)
BUN: 18 mg/dL (ref 8–23)
CO2: 26 mmol/L (ref 22–32)
Calcium: 9.2 mg/dL (ref 8.9–10.3)
Chloride: 107 mmol/L (ref 98–111)
Creatinine, Ser: 0.83 mg/dL (ref 0.44–1.00)
GFR, Estimated: >60 mL/min (ref >60)
Glucose, Bld: 101 mg/dL — ABNORMAL HIGH (ref 70–99)
Potassium: 3.8 mmol/L (ref 3.5–5.1)
Sodium: 140 mmol/L (ref 135–145)

## 2020-09-19 LAB — TROPONIN I (HIGH SENSITIVITY)
Troponin I (High Sensitivity): 3 ng/L (ref <18)
Troponin I (High Sensitivity): 5 ng/L (ref <18)

## 2020-09-19 LAB — LIPASE, BLOOD: Lipase: 30 U/L (ref 11–51)

## 2020-09-19 MED ORDER — METOCLOPRAMIDE HCL 5 MG/ML IJ SOLN
10.0000 mg | Freq: Once | INTRAMUSCULAR | Status: AC
  Administered 2020-09-19: 10 mg via INTRAVENOUS
  Filled 2020-09-19: qty 2

## 2020-09-19 MED ORDER — SODIUM CHLORIDE 0.9 % IV SOLN
12.5000 mg | Freq: Four times a day (QID) | INTRAVENOUS | Status: DC | PRN
  Filled 2020-09-19: qty 0.5

## 2020-09-19 MED ORDER — ONDANSETRON HCL 4 MG PO TABS: 4.0000 mg | ORAL_TABLET | Freq: Three times a day (TID) | ORAL | 0 refills | Status: DC | PRN

## 2020-09-19 MED ORDER — IOHEXOL 350 MG/ML SOLN
80.0000 mL | Freq: Once | INTRAVENOUS | Status: AC | PRN
  Administered 2020-09-19: 80 mL via INTRAVENOUS

## 2020-09-19 MED ORDER — ONDANSETRON HCL 4 MG/2ML IJ SOLN
4.0000 mg | Freq: Once | INTRAMUSCULAR | Status: AC
  Administered 2020-09-19: 4 mg via INTRAVENOUS
  Filled 2020-09-19: qty 2

## 2020-09-19 MED ORDER — FENTANYL CITRATE PF 50 MCG/ML IJ SOSY
100.0000 ug | PREFILLED_SYRINGE | Freq: Once | INTRAMUSCULAR | Status: AC
  Administered 2020-09-19: 100 ug via INTRAVENOUS
  Filled 2020-09-19: qty 2

## 2020-09-19 NOTE — ED Notes (Signed)
Pt. And daughter received update on POC, MD notified they are ready for d/c. MD to go talk to pt. Prior to d/c.

## 2020-09-19 NOTE — ED Triage Notes (Signed)
Pt to ED POV c.o chest pain that started a couple hours ago, chest pain under R breast and both shoulders. Pt also complaining of SOB and hurts to breathe.  Pt denies cardiac hx, denies abdominal pain, and cough.  A&O X4, NAD

## 2020-09-19 NOTE — Discharge Instructions (Addendum)
Please seek medical attention for any high fevers, chest pain, shortness of breath, change in behavior, persistent vomiting, bloody stool or any other new or concerning symptoms.  

## 2020-09-19 NOTE — ED Provider Notes (Signed)
[facility] Emergency Department Provider Note ____________________________________________   Event Date/Time   First MD Initiated Contact with Patient 09/19/20 1722     (approximate)  I have reviewed the triage vital signs and the nursing notes.   HISTORY  Chief Complaint Chest Pain  HPI Savannah Wilkins is a 70 y.o. female with history of DVT, PE, presents to the emergency department for treatment and evaluation of acute onset right upper quadrant abdominal pain that radiates under the anterior ribs. She also feels pain in bilateral anterior shoulders. She denies history of the same. She reports hip replacement on 08/12/20, but otherwise no recent illness. She denies postsurgical complication. No fever, nausea, vomiting, or diarrhea. No alleviating measures prior to arrival.         Past Medical History:  Diagnosis Date   Arthritis    DVT (deep venous thrombosis) ([place]) 2015   pulmonary emboli   Headache    History of kidney stones    Lichen sclerosus    Pneumonia 2015   PONV (postoperative nausea and vomiting)    Pulmonary emboli ([facility]) 2015   Restless leg syndrome     Patient Active Problem List   Diagnosis Date Noted   Status post total hip replacement, left 08/12/2020   S/P hip replacement 04/08/2020   Restless legs 07/02/2018   Postmenopausal bleeding 07/02/2018   Lesion of vulva 07/02/2018   Nephrolithiasis 07/02/2018   Cataract 07/02/2018   Medicare annual wellness visit, initial 11/09/2017   Headache disorder 08/26/2017   Obesity (BMI 35.0-39.9 without comorbidity) [id number]   Lichen sclerosus [id number]   History of pulmonary embolus (PE) 07/09/2017   Chronic daily headache 07/09/2017   DVT of leg (deep venous thrombosis) ([place]) 08/17/2013   Hypoxia 08/16/2013   Pleuritic chest pain 08/15/2013   Acute pulmonary embolism ([place]) 08/15/2013   Arthropathy of pelvic region and thigh 07/03/2012   Arthritis, hip 07/03/2012    Past  Surgical History:  Procedure Laterality Date   CHOLECYSTECTOMY  1996   COLONOSCOPY  2015   CYST EXCISION Right 2010   pinkie finger   dcr Right    right eye   EXTRACORPOREAL SHOCK WAVE LITHOTRIPSY Bilateral 2006   twice on left and once on right   EXTRACORPOREAL SHOCK WAVE LITHOTRIPSY Right 07/25/2018   Procedure: EXTRACORPOREAL SHOCK WAVE LITHOTRIPSY (ESWL);  Surgeon: Billey Co, MD;  Location: [facility];  Service: Urology;  Laterality: Right;   EYE SURGERY Bilateral 2013   cataract extractions   HYSTEROSCOPY  2015   IVC FILTER INSERTION N/A 04/06/2020   Procedure: IVC FILTER INSERTION;  Surgeon: Katha Cabal, MD;  Location: [facility];  Service: Cardiovascular;  Laterality: N/A;   kidney stone removal Right 2006, 2013   right ureteroscopic stone removals   RETINAL TEAR REPAIR CRYOTHERAPY Bilateral    RIGHT 2007 AND 2008; LEFT 2012   TONSILLECTOMY  1984   TOTAL HIP ARTHROPLASTY Right 04/08/2020   Procedure: TOTAL HIP ARTHROPLASTY ANTERIOR APPROACH;  Surgeon: Hessie Knows, MD;  Location: [facility];  Service: Orthopedics;  Laterality: Right;   TOTAL HIP ARTHROPLASTY Left 08/12/2020   Procedure: TOTAL HIP ARTHROPLASTY ANTERIOR APPROACH;  Surgeon: Hessie Knows, MD;  Location: [facility];  Service: Orthopedics;  Laterality: Left;   TUBAL LIGATION  1985    Prior to Admission medications   Medication Sig Start Date End Date Taking? Authorizing Provider  apixaban (ELIQUIS) 5 MG TABS tablet Take 1 tablet (5 mg total) by mouth 2 (two)  times daily. 04/10/20   Duanne Guess, PA-C  Carboxymethylcellul-Glycerin (REFRESH OPTIVE OP) Place 1 drop into both eyes 3 (three) times daily as needed (dry eyes).    [provider]  clobetasol ointment (TEMOVATE) [id number] % Apply 1 application topically 2 (two) times a week. lichen sclerosus (Mondays & Fridays) 03/19/18   [provider]  EMGALITY 120 MG/ML SOAJ Inject 120 mg as directed every 30 (thirty) days. 06/12/18    [provider]  HYDROcodone-acetaminophen (NORCO/VICODIN) 5-325 MG tablet Take 1-2 tablets by mouth every 4 (four) hours as needed for moderate pain (pain score 4-6). 08/13/20   Duanne Guess, PA-C  methocarbamol (ROBAXIN) 500 MG tablet Take 1 tablet (500 mg total) by mouth every 6 (six) hours as needed for muscle spasms. 08/13/20   Duanne Guess, PA-C  Multiple Vitamins-Minerals (PRESERVISION AREDS 2 PO) Take 1 tablet by mouth in the morning and at bedtime.    [provider]  traMADol (ULTRAM) 50 MG tablet Take 1 tablet (50 mg total) by mouth every 6 (six) hours as needed. 08/13/20   Duanne Guess, PA-C    Allergies Ciprofloxacin, Penicillins, Quinolones, and Sulfa antibiotics  Family History  Problem Relation Age of Onset   Stroke Mother    Stroke Father    AAA (abdominal aortic aneurysm) Father    Prostate cancer Father    Hypertension Father     Social History Social History   Tobacco Use   Smoking status: Never   Smokeless tobacco: Never  Vaping Use   Vaping Use: Never used  Substance Use Topics   Alcohol use: Not Currently   Drug use: Not Currently    Review of Systems  Constitutional: No fever/chills Eyes: No visual changes. ENT: No sore throat. Cardiovascular: Denies chest pain. Respiratory: Denies shortness of breath. Gastrointestinal:Positive for abdominal pain.  No nausea, no vomiting.  No diarrhea.  No constipation. Genitourinary: Negative for dysuria. Musculoskeletal: Negative for back pain. Skin: Negative for rash. Neurological: Negative for headaches, focal weakness or numbness. ____________________________________________   PHYSICAL EXAM:  VITAL SIGNS: ED Triage Vitals  Enc Vitals Group     BP 09/19/20 1611 (!) 152/113     Pulse Rate 09/19/20 1611 83     Resp 09/19/20 1611 (!) 24     Temp 09/19/20 1611 98.4 F (36.9 C)     Temp Source 09/19/20 1611 Oral     SpO2 09/19/20 1611 98 %     Weight 09/19/20 1612 220 lb  (99.8 kg)     Height 09/19/20 1612 '5\' 5"'$  (1.651 m)     Head Circumference --      Peak Flow --      Pain Score 09/19/20 1610 6     Pain Loc --      Pain Edu? --      Excl. in [place]? --     Constitutional: Alert and oriented. Well appearing and in moderate acute distress. Eyes: Conjunctivae are normal.  Head: Atraumatic. Nose: No congestion/rhinnorhea. Mouth/Throat: Mucous membranes are moist.  Oropharynx non-erythematous. Neck: No stridor.   Hematological/Lymphatic/Immunilogical: No cervical lymphadenopathy. Cardiovascular: Normal rate, regular rhythm. Grossly normal heart sounds.  Good peripheral circulation. Respiratory: Normal respiratory effort.  No retractions. Lungs CTAB. Gastrointestinal: Soft and exquisitely tender in the right upper quadrant. No distention. No abdominal bruits.  Musculoskeletal: No lower extremity tenderness nor edema.  No joint effusions. Neurologic:  Normal speech and language. No gross focal neurologic deficits are appreciated. No gait instability. Skin:  Skin is warm, dry and intact. No rash noted. Psychiatric: Mood and affect are normal. Speech and behavior are normal.  ____________________________________________   LABS (all labs ordered are listed, but only abnormal results are displayed)  Labs Reviewed  BASIC METABOLIC PANEL - Abnormal; Notable for the following components:      Result Value   Glucose, Bld 101 (*)    All other components within normal limits  RESP PANEL BY RT-PCR (FLU A&B, COVID) ARPGX2  CBC  HEPATIC FUNCTION PANEL  LIPASE, BLOOD  TROPONIN I (HIGH SENSITIVITY)  TROPONIN I (HIGH SENSITIVITY)   ____________________________________________  EKG  ED ECG REPORT I, Sherrie George, the attending physician, personally viewed and interpreted this ECG.   Date: 09/19/2020  EKG Time: 1608  Rate: 88  Rhythm: normal EKG, normal sinus rhythm  Axis: Normal  Intervals:none  ST&T Change: No ST  elevation  ____________________________________________  RADIOLOGY  ED MD interpretation:    CT of the abdomen pelvis with IV contrast shows gaseously distended loops of small bowel with fecalization at the terminal ileum.  No pathologically dilated loops of small bowel or large bowel.  Findings are favored to be ileus.  I, Sherrie George, personally viewed and evaluated these images (plain radiographs) as part of my medical decision making, as well as reviewing the written report by the radiologist.  Official radiology report(s): DG Chest 2 View  Result Date: 09/19/2020 CLINICAL DATA:  Chest pain EXAM: CHEST - 2 VIEW COMPARISON:  CT abdomen pelvis 07/17/2018. FINDINGS: Cardiomediastinal silhouette is within normal limits. There is a broad lucency along the central mediastinum favored to be a dilated esophagus filled with gas. There is bibasilar subsegmental atelectasis. No focal airspace consolidation. No large pleural effusion or visible pneumothorax. Bilateral shoulder degenerative changes, right worse than left, with evidence of chronic right AC joint injury. Marked gaseous distension of the stomach. There is curvilinear lucency under the right hemidiaphragm which is favored to be colonic interposition. Gaseous distention of the colon as well. IMPRESSION: Marked gaseous distention of the stomach with elevated left hemidiaphragm. Bibasilar atelectasis. No focal airspace consolidation. Gas-filled patulous esophagus as seen on prior exams. Curvilinear lucency under the right hemidiaphragm, which is favored to be due to an overlapping loop of gas distended bowel, however recommend abdominal series radiographs and/or left lateral decubitus abdominal radiograph to rule out pneumoperitoneum. Electronically Signed   By: Maurine Simmering M.D.   On: 09/19/2020 16:52   DG Abdomen 1 View  Result Date: 09/19/2020 CLINICAL DATA:  Patient with abdominal distension. Rule out free intraperitoneal air. EXAM: ABDOMEN -  1 VIEW COMPARISON:  Chest radiograph earlier same day. FINDINGS: Decubitus images demonstrate gaseous distended stomach and loops of small bowel within the abdomen. No definite free intraperitoneal air is identified. IVC filter. IMPRESSION: No definite free intraperitoneal air identified. Gaseous distention of the stomach and loops of small bowel. Electronically Signed   By: Lovey Newcomer M.D.   On: 09/19/2020 18:12   CT ABDOMEN PELVIS W CONTRAST  Result Date: 09/19/2020 CLINICAL DATA:  Right upper quadrant abdominal pain. EXAM: CT ABDOMEN AND PELVIS WITH CONTRAST TECHNIQUE: Multidetector CT imaging of the abdomen and pelvis was performed using the standard protocol following bolus administration of intravenous contrast. CONTRAST:  39m OMNIPAQUE IOHEXOL 350 MG/ML SOLN COMPARISON:  MRI August 25, 2018 and CT July 17, 2018. FINDINGS: Lower chest: Hypoventilatory change in the lung bases. Patulous lower thoracic esophagus. Hepatobiliary: No suspicious hepatic lesion. Gallbladder surgically absent. Prominence of the intra and extrahepatic  biliary tree, similar prior and favored to reflect reservoir effect post cholecystectomy. Pancreas: Within normal limits. Spleen: Within normal limits. Adrenals/Urinary Tract: Bilateral adrenal glands are unremarkable. Bilateral nonobstructive renal stones measuring up to 3 mm. No hydronephrosis. No obstructive ureteral or bladder calculi visualized. 1.9 cm lesion along the anterior aspect of the left kidney previously characterized as a cyst on MRI August 25, 2018. Evaluation of the urinary bladder is limited by under distension and streak artifact from bilateral hip prostheses. Stomach/Bowel: Gaseous distension of the stomach with normal caliber duodenum. Gaseously distended loops of small bowel. Fecalization of the distal/terminal ileum, indicating slow transit. Small volume of formed stool in the colon with gaseous distension of redundant transverse and sigmoid colon. The descending  colon is predominantly decompressed. No pathologically dilated loops of small or large bowel. No pneumatosis. Vascular/Lymphatic: Aortic atherosclerosis without abdominal aortic aneurysm. Infrarenal IVC filter. No pathologically enlarged abdominal or pelvic lymph nodes. Reproductive: Tubal ligation clips. Evaluation of the reproductive system is degraded by streak artifact from bilateral hip prostheses. Other: No abdominopelvic free fluid. No pneumoperitoneum. No portal venous gas. Musculoskeletal: Mild multilevel degenerative changes spine. Bilateral total hip arthroplasties. No acute osseous abnormality. IMPRESSION: 1. Gaseously distended loops of small bowel with fecalization of the distal/terminal ileum, indicating slow transit. No pathologically dilated loops of small or large bowel. Findings are favored to reflect ileus. 2. Bilateral nonobstructive nephrolithiasis. 3.  Aortic Atherosclerosis (ICD10-I70.0). Electronically Signed   By: Dahlia Bailiff M.D.   On: 09/19/2020 18:44    ____________________________________________   PROCEDURES  Procedure(s) performed (including Critical Care):  Procedures  ____________________________________________   INITIAL IMPRESSION / ASSESSMENT AND PLAN     70 year old female presenting to the emergency department for treatment and evaluation of cute onset right upper quadrant abdominal pain that radiated up into the right chest.  See HPI for further details.  Patient does appear to be very uncomfortable.  Pain medication ordered.  Awaiting results of labs that were drawn while awaiting ER room assignment.  Will get additional imaging.  DIFFERENTIAL DIAGNOSIS  Small bowel obstruction, perforation, cholecystitis  ED COURSE  CT of the abdomen and pelvis does show gaseously distended loops of small bowel but no indication of obstruction or perforation.  Patient's pain relieved by fentanyl however she now feels excessively nauseated.  She is already had 1  dose of Zofran.  Will give her Phenergan and Reglan. She was advised to remain NPO. Discussed admission. Patient would like to see if her nausea is controlled with medications and see if pain returns when fentanyl wears off.   ----------------------------------------- 7:50 PM on 09/19/2020 ----------------------------------------- Care relinquished to Dr. Archie Balboa who will reassess after medications and plan disposition.    Clinical Course as of 09/19/20 1952  Nancy Fetter Sep 19, 2020  1731 DG Abdomen 1 View [CT]    Clinical Course User Index [CT] Victorino Dike, FNP   ___________________________________________   FINAL CLINICAL IMPRESSION(S) / ED DIAGNOSES  Final diagnoses:  Ileus [facility])     ED Discharge Orders     None        Savannah Wilkins was evaluated in Emergency Department on 09/19/2020 for the symptoms described in the history of present illness. She was evaluated in the context of the global COVID-19 pandemic, which necessitated consideration that the patient might be at risk for infection with the SARS-CoV-2 virus that causes COVID-19. Institutional protocols and algorithms that pertain to the evaluation of patients at risk for COVID-19 are in a state of  rapid change based on information released by regulatory bodies including the CDC and federal and state organizations. These policies and algorithms were followed during the patient's care in the ED.   Note:  This document was prepared using Dragon voice recognition software and may include unintentional dictation errors.    Victorino Dike, FNP 09/19/20 1952    Nance Pear, MD 09/19/20 2022

## 2020-09-23 ENCOUNTER — Other Ambulatory Visit: Payer: Self-pay | Admitting: Obstetrics and Gynecology

## 2020-09-23 DIAGNOSIS — Z1231 Encounter for screening mammogram for malignant neoplasm of breast: Secondary | ICD-10-CM

## 2020-10-10 NOTE — Progress Notes (Signed)
MRN : [id number]  Savannah Wilkins is a 70 y.o. (1950-11-13) female who presents with chief complaint of talk about my filter.  History of Present Illness:   Procedure 04/06/2020: Placement of a Denali IVC filter   The patient presents to [facility] for placement of an IVC filter in association with DJD requiring joint replacement surgery.  She is now s/p left hip replacement on 7/28/20222 (right hip was done in March).  She has done well and is all finished with her PT.  She stops her Eliquis prophylaxis next week.  DVT was identified 5 years ago and was treated with anticoagulation.  The presenting symptom was chest pain, no pain or swelling in the lower extremity.  No SOB.  At that time there were no inciting events   The patient has not been using compression therapy at this point.   No SOB or pleuritic chest pains.  No cough or hemoptysis.   No blood per rectum or blood in any sputum.  No excessive bruising per the patient.       No outpatient medications have been marked as taking for the 10/11/20 encounter (Appointment) with Delana Meyer, Dolores Lory, MD.    Past Medical History:  Diagnosis Date   Arthritis    DVT (deep venous thrombosis) ([place]) 2015   pulmonary emboli   Headache    History of kidney stones    Lichen sclerosus    Pneumonia 2015   PONV (postoperative nausea and vomiting)    Pulmonary emboli ([place]) 2015   Restless leg syndrome     Past Surgical History:  Procedure Laterality Date   CHOLECYSTECTOMY  1996   COLONOSCOPY  2015   CYST EXCISION Right 2010   pinkie finger   dcr Right    right eye   EXTRACORPOREAL SHOCK WAVE LITHOTRIPSY Bilateral 2006   twice on left and once on right   EXTRACORPOREAL SHOCK WAVE LITHOTRIPSY Right 07/25/2018   Procedure: EXTRACORPOREAL SHOCK WAVE LITHOTRIPSY (ESWL);  Surgeon: Billey Co, MD;  Location: [facility];  Service: Urology;  Laterality: Right;   EYE SURGERY Bilateral 2013   cataract extractions   HYSTEROSCOPY  2015   IVC  FILTER INSERTION N/A 04/06/2020   Procedure: IVC FILTER INSERTION;  Surgeon: Katha Cabal, MD;  Location: [facility];  Service: Cardiovascular;  Laterality: N/A;   kidney stone removal Right 2006, 2013   right ureteroscopic stone removals   RETINAL TEAR REPAIR CRYOTHERAPY Bilateral    RIGHT 2007 AND 2008; LEFT 2012   TONSILLECTOMY  1984   TOTAL HIP ARTHROPLASTY Right 04/08/2020   Procedure: TOTAL HIP ARTHROPLASTY ANTERIOR APPROACH;  Surgeon: Hessie Knows, MD;  Location: [facility];  Service: Orthopedics;  Laterality: Right;   TOTAL HIP ARTHROPLASTY Left 08/12/2020   Procedure: TOTAL HIP ARTHROPLASTY ANTERIOR APPROACH;  Surgeon: Hessie Knows, MD;  Location: [facility];  Service: Orthopedics;  Laterality: Left;   TUBAL LIGATION  1985    Social History Social History   Tobacco Use   Smoking status: Never   Smokeless tobacco: Never  Vaping Use   Vaping Use: Never used  Substance Use Topics   Alcohol use: Not Currently   Drug use: Not Currently    Family History Family History  Problem Relation Age of Onset   Stroke Mother    Stroke Father    AAA (abdominal aortic aneurysm) Father    Prostate cancer Father    Hypertension Father     Allergies  Allergen Reactions  Ciprofloxacin Other (See Comments)    tendonitis   Penicillins Rash    Tolerated 2g Ancef   Quinolones Other (See Comments)    Caused pain & cramping Caused severe tendonitis in feet   Sulfa Antibiotics Rash     REVIEW OF SYSTEMS (Negative unless checked)  Constitutional: [] Weight loss  [] Fever  [] Chills Cardiac: [] Chest pain   [] Chest pressure   [] Palpitations   [] Shortness of breath when laying flat   [] Shortness of breath with exertion. Vascular:  [] Pain in legs with walking   [] Pain in legs at rest  [] History of DVT   [] Phlebitis   [x] Swelling in legs   [] Varicose veins   [] Non-healing ulcers Pulmonary:   [] Uses home oxygen   [] Productive cough   [] Hemoptysis   [] Wheeze  [] COPD    [] Asthma Neurologic:  [] Dizziness   [] Seizures   [] History of stroke   [] History of TIA  [] Aphasia   [] Vissual changes   [] Weakness or numbness in arm   [] Weakness or numbness in leg Musculoskeletal:   [] Joint swelling   [] Joint pain   [] Low back pain Hematologic:  [] Easy bruising  [] Easy bleeding   [] Hypercoagulable state   [] Anemic Gastrointestinal:  [] Diarrhea   [] Vomiting  [] Gastroesophageal reflux/heartburn   [] Difficulty swallowing. Genitourinary:  [] Chronic kidney disease   [] Difficult urination  [] Frequent urination   [] Blood in urine Skin:  [] Rashes   [] Ulcers  Psychological:  [] History of anxiety   []  History of major depression.  Physical Examination  There were no vitals filed for this visit. There is no height or weight on file to calculate BMI. Gen: WD/WN, NAD Head: [place]/AT, No temporalis wasting.  Ear/Nose/Throat: Hearing grossly intact, nares w/o erythema or drainage, pinna without lesions Eyes: PER, EOMI, sclera nonicteric.  Neck: Supple, no gross masses.  No JVD.  Pulmonary:  Good air movement, no audible wheezing, no use of accessory muscles.  Cardiac: RRR, precordium not hyperdynamic. Vascular:    2+ soft pitting edema  Vessel Right Left  Radial Palpable Palpable  Gastrointestinal: soft, non-distended. No guarding/no peritoneal signs.  Musculoskeletal: M/S 5/5 throughout.  No deformity.  Neurologic: CN 2-12 intact. Pain and light touch intact in extremities.  Symmetrical.  Speech is fluent. Motor exam as listed above. Psychiatric: Judgment intact, Mood & affect appropriate for pt's clinical situation. Dermatologic: Venous rashes no ulcers noted.  No changes consistent with cellulitis. Lymph : No lichenification or skin changes of chronic lymphedema.  CBC Lab Results  Component Value Date   WBC 10.2 09/19/2020   HGB 13.5 09/19/2020   HCT 41.2 09/19/2020   MCV 91.4 09/19/2020   PLT 285 09/19/2020    BMET    Component Value Date/Time   NA 140 09/19/2020 1616    K 3.8 09/19/2020 1616   CL 107 09/19/2020 1616   CO2 26 09/19/2020 1616   GLUCOSE 101 (H) 09/19/2020 1616   BUN 18 09/19/2020 1616   CREATININE 0.83 09/19/2020 1616   CALCIUM 9.2 09/19/2020 1616   GFRNONAA >60 09/19/2020 1616   CrCl cannot be calculated (Unknown ideal weight.).  COAG No results found for: INR, PROTIME  Radiology DG Chest 2 View  Result Date: 09/19/2020 CLINICAL DATA:  Chest pain EXAM: CHEST - 2 VIEW COMPARISON:  CT abdomen pelvis 07/17/2018. FINDINGS: Cardiomediastinal silhouette is within normal limits. There is a broad lucency along the central mediastinum favored to be a dilated esophagus filled with gas. There is bibasilar subsegmental atelectasis. No focal airspace consolidation. No large pleural effusion or  visible pneumothorax. Bilateral shoulder degenerative changes, right worse than left, with evidence of chronic right AC joint injury. Marked gaseous distension of the stomach. There is curvilinear lucency under the right hemidiaphragm which is favored to be colonic interposition. Gaseous distention of the colon as well. IMPRESSION: Marked gaseous distention of the stomach with elevated left hemidiaphragm. Bibasilar atelectasis. No focal airspace consolidation. Gas-filled patulous esophagus as seen on prior exams. Curvilinear lucency under the right hemidiaphragm, which is favored to be due to an overlapping loop of gas distended bowel, however recommend abdominal series radiographs and/or left lateral decubitus abdominal radiograph to rule out pneumoperitoneum. Electronically Signed   By: Maurine Simmering M.D.   On: 09/19/2020 16:52   DG Abdomen 1 View  Result Date: 09/19/2020 CLINICAL DATA:  Patient with abdominal distension. Rule out free intraperitoneal air. EXAM: ABDOMEN - 1 VIEW COMPARISON:  Chest radiograph earlier same day. FINDINGS: Decubitus images demonstrate gaseous distended stomach and loops of small bowel within the abdomen. No definite free intraperitoneal  air is identified. IVC filter. IMPRESSION: No definite free intraperitoneal air identified. Gaseous distention of the stomach and loops of small bowel. Electronically Signed   By: Lovey Newcomer M.D.   On: 09/19/2020 18:12   CT ABDOMEN PELVIS W CONTRAST  Result Date: 09/19/2020 CLINICAL DATA:  Right upper quadrant abdominal pain. EXAM: CT ABDOMEN AND PELVIS WITH CONTRAST TECHNIQUE: Multidetector CT imaging of the abdomen and pelvis was performed using the standard protocol following bolus administration of intravenous contrast. CONTRAST:  44mL OMNIPAQUE IOHEXOL 350 MG/ML SOLN COMPARISON:  MRI August 25, 2018 and CT July 17, 2018. FINDINGS: Lower chest: Hypoventilatory change in the lung bases. Patulous lower thoracic esophagus. Hepatobiliary: No suspicious hepatic lesion. Gallbladder surgically absent. Prominence of the intra and extrahepatic biliary tree, similar prior and favored to reflect reservoir effect post cholecystectomy. Pancreas: Within normal limits. Spleen: Within normal limits. Adrenals/Urinary Tract: Bilateral adrenal glands are unremarkable. Bilateral nonobstructive renal stones measuring up to 3 mm. No hydronephrosis. No obstructive ureteral or bladder calculi visualized. 1.9 cm lesion along the anterior aspect of the left kidney previously characterized as a cyst on MRI August 25, 2018. Evaluation of the urinary bladder is limited by under distension and streak artifact from bilateral hip prostheses. Stomach/Bowel: Gaseous distension of the stomach with normal caliber duodenum. Gaseously distended loops of small bowel. Fecalization of the distal/terminal ileum, indicating slow transit. Small volume of formed stool in the colon with gaseous distension of redundant transverse and sigmoid colon. The descending colon is predominantly decompressed. No pathologically dilated loops of small or large bowel. No pneumatosis. Vascular/Lymphatic: Aortic atherosclerosis without abdominal aortic aneurysm.  Infrarenal IVC filter. No pathologically enlarged abdominal or pelvic lymph nodes. Reproductive: Tubal ligation clips. Evaluation of the reproductive system is degraded by streak artifact from bilateral hip prostheses. Other: No abdominopelvic free fluid. No pneumoperitoneum. No portal venous gas. Musculoskeletal: Mild multilevel degenerative changes spine. Bilateral total hip arthroplasties. No acute osseous abnormality. IMPRESSION: 1. Gaseously distended loops of small bowel with fecalization of the distal/terminal ileum, indicating slow transit. No pathologically dilated loops of small or large bowel. Findings are favored to reflect ileus. 2. Bilateral nonobstructive nephrolithiasis. 3.  Aortic Atherosclerosis (ICD10-I70.0). Electronically Signed   By: Dahlia Bailiff M.D.   On: 09/19/2020 18:44     Assessment/Plan 1. 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 IVC filter should  be removed.  Risk and benefits were reviewed the patient.  Indications for the procedure were reviewed.  All questions were answered, the patient agrees to proceed.    The patient completed her perioperative anticoagulation   Elevation was stressed, use of a recliner was discussed.  The patient will wear graduated compression stockings class 1 (20-30 mmHg), beginning after three full days of anticoagulation, on a daily basis a prescription was given. The patient will  beginning wearing the stockings first thing in the morning and removing them in the evening. The patient is instructed not to sleep in the stockings.  In addition, behavioral modification including elevation during the day and avoidance of prolonged dependency will be initiated.     2. Arthritis, hip Continue NSAID medications as already ordered, these medications have been reviewed and there are no changes at this  time.  Continued activity and therapy was stressed.     Hortencia Pilar, MD  10/10/2020 2:18 PM

## 2020-10-10 NOTE — H&P (View-Only) (Signed)
MRN : [id number]  Savannah Wilkins is a 70 y.o. (1950-08-02) female who presents with chief complaint of talk about my filter.  History of Present Illness:   Procedure 04/06/2020: Placement of a Denali IVC filter   The patient presents to [facility] for placement of an IVC filter in association with DJD requiring joint replacement surgery.  She is now s/p left hip replacement on 7/28/20222 (right hip was done in March).  She has done well and is all finished with her PT.  She stops her Eliquis prophylaxis next week.  DVT was identified 5 years ago and was treated with anticoagulation.  The presenting symptom was chest pain, no pain or swelling in the lower extremity.  No SOB.  At that time there were no inciting events   The patient has not been using compression therapy at this point.   No SOB or pleuritic chest pains.  No cough or hemoptysis.   No blood per rectum or blood in any sputum.  No excessive bruising per the patient.       No outpatient medications have been marked as taking for the 10/11/20 encounter (Appointment) with Delana Meyer, Dolores Lory, MD.    Past Medical History:  Diagnosis Date   Arthritis    DVT (deep venous thrombosis) ([place]) 2015   pulmonary emboli   Headache    History of kidney stones    Lichen sclerosus    Pneumonia 2015   PONV (postoperative nausea and vomiting)    Pulmonary emboli ([place]) 2015   Restless leg syndrome     Past Surgical History:  Procedure Laterality Date   CHOLECYSTECTOMY  1996   COLONOSCOPY  2015   CYST EXCISION Right 2010   pinkie finger   dcr Right    right eye   EXTRACORPOREAL SHOCK WAVE LITHOTRIPSY Bilateral 2006   twice on left and once on right   EXTRACORPOREAL SHOCK WAVE LITHOTRIPSY Right 07/25/2018   Procedure: EXTRACORPOREAL SHOCK WAVE LITHOTRIPSY (ESWL);  Surgeon: Billey Co, MD;  Location: [facility];  Service: Urology;  Laterality: Right;   EYE SURGERY Bilateral 2013   cataract extractions   HYSTEROSCOPY  2015   IVC  FILTER INSERTION N/A 04/06/2020   Procedure: IVC FILTER INSERTION;  Surgeon: Katha Cabal, MD;  Location: [facility];  Service: Cardiovascular;  Laterality: N/A;   kidney stone removal Right 2006, 2013   right ureteroscopic stone removals   RETINAL TEAR REPAIR CRYOTHERAPY Bilateral    RIGHT 2007 AND 2008; LEFT 2012   TONSILLECTOMY  1984   TOTAL HIP ARTHROPLASTY Right 04/08/2020   Procedure: TOTAL HIP ARTHROPLASTY ANTERIOR APPROACH;  Surgeon: Hessie Knows, MD;  Location: [facility];  Service: Orthopedics;  Laterality: Right;   TOTAL HIP ARTHROPLASTY Left 08/12/2020   Procedure: TOTAL HIP ARTHROPLASTY ANTERIOR APPROACH;  Surgeon: Hessie Knows, MD;  Location: [facility];  Service: Orthopedics;  Laterality: Left;   TUBAL LIGATION  1985    Social History Social History   Tobacco Use   Smoking status: Never   Smokeless tobacco: Never  Vaping Use   Vaping Use: Never used  Substance Use Topics   Alcohol use: Not Currently   Drug use: Not Currently    Family History Family History  Problem Relation Age of Onset   Stroke Mother    Stroke Father    AAA (abdominal aortic aneurysm) Father    Prostate cancer Father    Hypertension Father     Allergies  Allergen Reactions  Ciprofloxacin Other (See Comments)    tendonitis   Penicillins Rash    Tolerated 2g Ancef   Quinolones Other (See Comments)    Caused pain & cramping Caused severe tendonitis in feet   Sulfa Antibiotics Rash     REVIEW OF SYSTEMS (Negative unless checked)  Constitutional: [] Weight loss  [] Fever  [] Chills Cardiac: [] Chest pain   [] Chest pressure   [] Palpitations   [] Shortness of breath when laying flat   [] Shortness of breath with exertion. Vascular:  [] Pain in legs with walking   [] Pain in legs at rest  [] History of DVT   [] Phlebitis   [x] Swelling in legs   [] Varicose veins   [] Non-healing ulcers Pulmonary:   [] Uses home oxygen   [] Productive cough   [] Hemoptysis   [] Wheeze  [] COPD    [] Asthma Neurologic:  [] Dizziness   [] Seizures   [] History of stroke   [] History of TIA  [] Aphasia   [] Vissual changes   [] Weakness or numbness in arm   [] Weakness or numbness in leg Musculoskeletal:   [] Joint swelling   [] Joint pain   [] Low back pain Hematologic:  [] Easy bruising  [] Easy bleeding   [] Hypercoagulable state   [] Anemic Gastrointestinal:  [] Diarrhea   [] Vomiting  [] Gastroesophageal reflux/heartburn   [] Difficulty swallowing. Genitourinary:  [] Chronic kidney disease   [] Difficult urination  [] Frequent urination   [] Blood in urine Skin:  [] Rashes   [] Ulcers  Psychological:  [] History of anxiety   []  History of major depression.  Physical Examination  There were no vitals filed for this visit. There is no height or weight on file to calculate BMI. Gen: WD/WN, NAD Head: [place]/AT, No temporalis wasting.  Ear/Nose/Throat: Hearing grossly intact, nares w/o erythema or drainage, pinna without lesions Eyes: PER, EOMI, sclera nonicteric.  Neck: Supple, no gross masses.  No JVD.  Pulmonary:  Good air movement, no audible wheezing, no use of accessory muscles.  Cardiac: RRR, precordium not hyperdynamic. Vascular:    2+ soft pitting edema  Vessel Right Left  Radial Palpable Palpable  Gastrointestinal: soft, non-distended. No guarding/no peritoneal signs.  Musculoskeletal: M/S 5/5 throughout.  No deformity.  Neurologic: CN 2-12 intact. Pain and light touch intact in extremities.  Symmetrical.  Speech is fluent. Motor exam as listed above. Psychiatric: Judgment intact, Mood & affect appropriate for pt's clinical situation. Dermatologic: Venous rashes no ulcers noted.  No changes consistent with cellulitis. Lymph : No lichenification or skin changes of chronic lymphedema.  CBC Lab Results  Component Value Date   WBC 10.2 09/19/2020   HGB 13.5 09/19/2020   HCT 41.2 09/19/2020   MCV 91.4 09/19/2020   PLT 285 09/19/2020    BMET    Component Value Date/Time   NA 140 09/19/2020 1616    K 3.8 09/19/2020 1616   CL 107 09/19/2020 1616   CO2 26 09/19/2020 1616   GLUCOSE 101 (H) 09/19/2020 1616   BUN 18 09/19/2020 1616   CREATININE 0.83 09/19/2020 1616   CALCIUM 9.2 09/19/2020 1616   GFRNONAA >60 09/19/2020 1616   CrCl cannot be calculated (Unknown ideal weight.).  COAG No results found for: INR, PROTIME  Radiology DG Chest 2 View  Result Date: 09/19/2020 CLINICAL DATA:  Chest pain EXAM: CHEST - 2 VIEW COMPARISON:  CT abdomen pelvis 07/17/2018. FINDINGS: Cardiomediastinal silhouette is within normal limits. There is a broad lucency along the central mediastinum favored to be a dilated esophagus filled with gas. There is bibasilar subsegmental atelectasis. No focal airspace consolidation. No large pleural effusion or  visible pneumothorax. Bilateral shoulder degenerative changes, right worse than left, with evidence of chronic right AC joint injury. Marked gaseous distension of the stomach. There is curvilinear lucency under the right hemidiaphragm which is favored to be colonic interposition. Gaseous distention of the colon as well. IMPRESSION: Marked gaseous distention of the stomach with elevated left hemidiaphragm. Bibasilar atelectasis. No focal airspace consolidation. Gas-filled patulous esophagus as seen on prior exams. Curvilinear lucency under the right hemidiaphragm, which is favored to be due to an overlapping loop of gas distended bowel, however recommend abdominal series radiographs and/or left lateral decubitus abdominal radiograph to rule out pneumoperitoneum. Electronically Signed   By: Maurine Simmering M.D.   On: 09/19/2020 16:52   DG Abdomen 1 View  Result Date: 09/19/2020 CLINICAL DATA:  Patient with abdominal distension. Rule out free intraperitoneal air. EXAM: ABDOMEN - 1 VIEW COMPARISON:  Chest radiograph earlier same day. FINDINGS: Decubitus images demonstrate gaseous distended stomach and loops of small bowel within the abdomen. No definite free intraperitoneal  air is identified. IVC filter. IMPRESSION: No definite free intraperitoneal air identified. Gaseous distention of the stomach and loops of small bowel. Electronically Signed   By: Lovey Newcomer M.D.   On: 09/19/2020 18:12   CT ABDOMEN PELVIS W CONTRAST  Result Date: 09/19/2020 CLINICAL DATA:  Right upper quadrant abdominal pain. EXAM: CT ABDOMEN AND PELVIS WITH CONTRAST TECHNIQUE: Multidetector CT imaging of the abdomen and pelvis was performed using the standard protocol following bolus administration of intravenous contrast. CONTRAST:  2mL OMNIPAQUE IOHEXOL 350 MG/ML SOLN COMPARISON:  MRI August 25, 2018 and CT July 17, 2018. FINDINGS: Lower chest: Hypoventilatory change in the lung bases. Patulous lower thoracic esophagus. Hepatobiliary: No suspicious hepatic lesion. Gallbladder surgically absent. Prominence of the intra and extrahepatic biliary tree, similar prior and favored to reflect reservoir effect post cholecystectomy. Pancreas: Within normal limits. Spleen: Within normal limits. Adrenals/Urinary Tract: Bilateral adrenal glands are unremarkable. Bilateral nonobstructive renal stones measuring up to 3 mm. No hydronephrosis. No obstructive ureteral or bladder calculi visualized. 1.9 cm lesion along the anterior aspect of the left kidney previously characterized as a cyst on MRI August 25, 2018. Evaluation of the urinary bladder is limited by under distension and streak artifact from bilateral hip prostheses. Stomach/Bowel: Gaseous distension of the stomach with normal caliber duodenum. Gaseously distended loops of small bowel. Fecalization of the distal/terminal ileum, indicating slow transit. Small volume of formed stool in the colon with gaseous distension of redundant transverse and sigmoid colon. The descending colon is predominantly decompressed. No pathologically dilated loops of small or large bowel. No pneumatosis. Vascular/Lymphatic: Aortic atherosclerosis without abdominal aortic aneurysm.  Infrarenal IVC filter. No pathologically enlarged abdominal or pelvic lymph nodes. Reproductive: Tubal ligation clips. Evaluation of the reproductive system is degraded by streak artifact from bilateral hip prostheses. Other: No abdominopelvic free fluid. No pneumoperitoneum. No portal venous gas. Musculoskeletal: Mild multilevel degenerative changes spine. Bilateral total hip arthroplasties. No acute osseous abnormality. IMPRESSION: 1. Gaseously distended loops of small bowel with fecalization of the distal/terminal ileum, indicating slow transit. No pathologically dilated loops of small or large bowel. Findings are favored to reflect ileus. 2. Bilateral nonobstructive nephrolithiasis. 3.  Aortic Atherosclerosis (ICD10-I70.0). Electronically Signed   By: Dahlia Bailiff M.D.   On: 09/19/2020 18:44     Assessment/Plan 1. 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 IVC filter should  be removed.  Risk and benefits were reviewed the patient.  Indications for the procedure were reviewed.  All questions were answered, the patient agrees to proceed.    The patient completed her perioperative anticoagulation   Elevation was stressed, use of a recliner was discussed.  The patient will wear graduated compression stockings class 1 (20-30 mmHg), beginning after three full days of anticoagulation, on a daily basis a prescription was given. The patient will  beginning wearing the stockings first thing in the morning and removing them in the evening. The patient is instructed not to sleep in the stockings.  In addition, behavioral modification including elevation during the day and avoidance of prolonged dependency will be initiated.     2. Arthritis, hip Continue NSAID medications as already ordered, these medications have been reviewed and there are no changes at this  time.  Continued activity and therapy was stressed.     Hortencia Pilar, MD  10/10/2020 2:18 PM

## 2020-10-11 ENCOUNTER — Encounter (INDEPENDENT_AMBULATORY_CARE_PROVIDER_SITE_OTHER): Payer: Self-pay | Admitting: Vascular Surgery

## 2020-10-11 ENCOUNTER — Other Ambulatory Visit: Payer: Self-pay

## 2020-10-11 ENCOUNTER — Telehealth (INDEPENDENT_AMBULATORY_CARE_PROVIDER_SITE_OTHER): Payer: Self-pay

## 2020-10-11 ENCOUNTER — Ambulatory Visit (INDEPENDENT_AMBULATORY_CARE_PROVIDER_SITE_OTHER): Payer: Medicare PPO | Admitting: Vascular Surgery

## 2020-10-11 ENCOUNTER — Encounter (INDEPENDENT_AMBULATORY_CARE_PROVIDER_SITE_OTHER): Payer: Self-pay

## 2020-10-11 VITALS — BP 143/84 | HR 82 | Resp 16 | Wt 219.8 lb

## 2020-10-11 DIAGNOSIS — I82519 Chronic embolism and thrombosis of unspecified femoral vein: Secondary | ICD-10-CM | POA: Diagnosis not present

## 2020-10-11 DIAGNOSIS — M1610 Unilateral primary osteoarthritis, unspecified hip: Secondary | ICD-10-CM | POA: Diagnosis not present

## 2020-10-11 NOTE — Telephone Encounter (Signed)
Patient is schedule for IVC filter removal on 10/26/20 with Dr Delana Meyer arrival time 8:30 am at the [facility]. Pre-procedure instructions were gone over and will be mailed out.

## 2020-10-25 ENCOUNTER — Ambulatory Visit (INDEPENDENT_AMBULATORY_CARE_PROVIDER_SITE_OTHER): Payer: Medicare PPO | Admitting: Vascular Surgery

## 2020-10-26 ENCOUNTER — Other Ambulatory Visit (INDEPENDENT_AMBULATORY_CARE_PROVIDER_SITE_OTHER): Payer: Self-pay | Admitting: Nurse Practitioner

## 2020-10-26 ENCOUNTER — Other Ambulatory Visit: Payer: Self-pay

## 2020-10-26 ENCOUNTER — Encounter
Admission: RE | Disposition: A | Discharge status: Home / Self Care | Payer: Self-pay | Source: Home / Self Care | Attending: Vascular Surgery

## 2020-10-26 ENCOUNTER — Ambulatory Visit
Admission: RE | Admit: 2020-10-26 | Discharge: 2020-10-26 | Disposition: A | Discharge status: Home / Self Care | Payer: Medicare PPO | Attending: Vascular Surgery | Admitting: Vascular Surgery

## 2020-10-26 ENCOUNTER — Encounter: Payer: Self-pay | Admitting: Vascular Surgery

## 2020-10-26 DIAGNOSIS — Z882 Allergy status to sulfonamides status: Secondary | ICD-10-CM | POA: Diagnosis not present

## 2020-10-26 DIAGNOSIS — Z4589 Encounter for adjustment and management of other implanted devices: Secondary | ICD-10-CM | POA: Insufficient documentation

## 2020-10-26 DIAGNOSIS — I82409 Acute embolism and thrombosis of unspecified deep veins of unspecified lower extremity: Secondary | ICD-10-CM

## 2020-10-26 DIAGNOSIS — Z96642 Presence of left artificial hip joint: Secondary | ICD-10-CM | POA: Insufficient documentation

## 2020-10-26 DIAGNOSIS — Z880 Allergy status to penicillin: Secondary | ICD-10-CM | POA: Insufficient documentation

## 2020-10-26 DIAGNOSIS — Z86718 Personal history of other venous thrombosis and embolism: Secondary | ICD-10-CM | POA: Diagnosis not present

## 2020-10-26 HISTORY — PX: IVC FILTER REMOVAL: CATH118246

## 2020-10-26 SURGERY — IVC FILTER REMOVAL
Anesthesia: Moderate Sedation

## 2020-10-26 MED ORDER — FENTANYL CITRATE PF 50 MCG/ML IJ SOSY
PREFILLED_SYRINGE | INTRAMUSCULAR | Status: AC
  Filled 2020-10-26: qty 1

## 2020-10-26 MED ORDER — METHYLPREDNISOLONE SODIUM SUCC 125 MG IJ SOLR: 125.0000 mg | Freq: Once | INTRAMUSCULAR | Status: DC | PRN

## 2020-10-26 MED ORDER — FAMOTIDINE 20 MG PO TABS: 40.0000 mg | ORAL_TABLET | Freq: Once | ORAL | Status: DC | PRN

## 2020-10-26 MED ORDER — HYDROMORPHONE HCL 1 MG/ML IJ SOLN
1.0000 mg | Freq: Once | INTRAMUSCULAR | Status: DC | PRN
Start: 2020-10-26 — End: 2020-10-26

## 2020-10-26 MED ORDER — CLINDAMYCIN PHOSPHATE 300 MG/50ML IV SOLN
INTRAVENOUS | Status: AC
  Administered 2020-10-26: 300 mg via INTRAVENOUS
  Filled 2020-10-26: qty 50

## 2020-10-26 MED ORDER — ONDANSETRON HCL 4 MG/2ML IJ SOLN: 4.0000 mg | Freq: Four times a day (QID) | INTRAMUSCULAR | Status: DC | PRN

## 2020-10-26 MED ORDER — MIDAZOLAM HCL 2 MG/2ML IJ SOLN
INTRAMUSCULAR | Status: AC
  Filled 2020-10-26: qty 2

## 2020-10-26 MED ORDER — MIDAZOLAM HCL 2 MG/2ML IJ SOLN
INTRAMUSCULAR | Status: DC | PRN
  Administered 2020-10-26: .5 mg via INTRAVENOUS
  Administered 2020-10-26: 2 mg via INTRAVENOUS
  Administered 2020-10-26: .5 mg via INTRAVENOUS
  Administered 2020-10-26: 1 mg via INTRAVENOUS

## 2020-10-26 MED ORDER — DIPHENHYDRAMINE HCL 50 MG/ML IJ SOLN: 50.0000 mg | Freq: Once | INTRAMUSCULAR | Status: DC | PRN

## 2020-10-26 MED ORDER — FENTANYL CITRATE (PF) 100 MCG/2ML IJ SOLN
INTRAMUSCULAR | Status: DC | PRN
  Administered 2020-10-26: 25 ug via INTRAVENOUS
  Administered 2020-10-26 (×3): 50 ug via INTRAVENOUS

## 2020-10-26 MED ORDER — SODIUM CHLORIDE 0.9 % IV SOLN: INTRAVENOUS | Status: DC

## 2020-10-26 MED ORDER — MIDAZOLAM HCL 2 MG/ML PO SYRP: 8.0000 mg | ORAL_SOLUTION | Freq: Once | ORAL | Status: DC | PRN

## 2020-10-26 MED ORDER — CLINDAMYCIN PHOSPHATE 300 MG/50ML IV SOLN: 300.0000 mg | Freq: Once | INTRAVENOUS | Status: AC

## 2020-10-26 SURGICAL SUPPLY — 7 items
CANNULA 5F STIFF (CANNULA) ×2 IMPLANT
COVER PROBE U/S 5X48 (MISCELLANEOUS) ×2 IMPLANT
ENSNARE 18-30 (MISCELLANEOUS) ×2 IMPLANT
KIT SNARE 25MM LOOP 120CM 6F (VASCULAR PRODUCTS) ×2 IMPLANT
PACK ANGIOGRAPHY (CUSTOM PROCEDURE TRAY) ×2 IMPLANT
SET CLOVERSNARE FLT RETRIEVAL (MISCELLANEOUS) ×2 IMPLANT
WIRE GUIDERIGHT .035X150 (WIRE) ×2 IMPLANT

## 2020-10-26 NOTE — Op Note (Signed)
[facility]  Percutaneous Study/Intervention Procedural Note   Date of Surgery: 10/26/2020,11:14 AM  Surgeon:Sylvester Salonga, Dolores Lory   Pre-operative Diagnosis: History of DVT; status post left hip replacement  Post-operative diagnosis:  Same  Procedure(s) Performed:  1.  Inferior venacavogram  2.  Attempted removal IVC filter  3.  Ultrasound-guided access to the right internal jugular vein    Anesthesia: Conscious sedation was administered by the interventional radiology RN under my direct supervision. IV Versed plus fentanyl were utilized. Continuous ECG, pulse oximetry and blood pressure was monitored throughout the entire procedure. Conscious sedation was administered for a total of 55 minutes.  Sheath: 10 [place] retrieval sheath right IJ antegrade  Contrast: 25 cc   Fluoroscopy Time: 10.6 minutes  Indications: Patient is status post left hip replacement.  She therefore does not require an IVC filter.  Removal has been recommended.  Risks and benefits of been reviewed all questions been answered patient is willing to proceed.  Procedure:  [facility] a 70 y.o. female who was identified and appropriate procedural time out was performed.  The patient was then placed supine on the table and prepped and draped in the usual sterile fashion for IVC filter removal.  Ultrasound was used to evaluate the right internal jugular vein.  It was echolucent and compressible indicating it is patent.  An ultrasound image was acquired for the permanent record.  A micropuncture needle was used to access the right internal jugular vein under direct ultrasound guidance.  The microwire was then advanced under fluoroscopic guidance without difficulty followed by the micro-sheath.  A 0.035 J wire was advanced without resistance and a 10 [place] retrieval sheath was placed.  Sheath was then negotiated over the J-wire into the inferior vena cava and positioned with its tip below the level  of the filter.  Initially the filter appears to be in an upright position.  Hand-injection contrast then demonstrates the filter is free of thrombus.  Next the sheath was repositioned so that the tip of the sheath is above the IVC filter and initially a [place] snare is utilized but this does not engage the hook.  Then a trilobed snare is tried but this also is unable to securely engage the hook.  Lastly a gooseneck snare was utilized in all 3 cases this nares appeared to just slide to the right lateral aspect of the filter.  At this point we obtained a near true lateral repositioned the sheath below the filter and a second hand-injection contrast was utilized.  This clearly demonstrates that the hook is embedded in the posterior wall of the IVC and therefore is not accessible to be snared and therefore preventing the filter from being retrieved.  At this point I elected to terminate the case given that retrieval will be a bit more complicated I will to discuss this with the patient.  Also of the should undergo general anesthesia prior to the procedure given the large amount of conscious sedation which she received.  Findings:   Inferior venacavogram: IVC is widely patent filter is upright but it is slanted posteriorly and the hook is embedded into the wall.   Disposition: Patient was taken to the recovery room in stable condition having tolerated the procedure well.  Belenda Cruise Judith Demps 10/26/2020,11:14 AM

## 2020-10-26 NOTE — Interval H&P Note (Signed)
History and Physical Interval Note:  10/26/2020 9:42 AM  [facility]  has presented today for surgery, with the diagnosis of IVC Filter Removal   DVT.  The various methods of treatment have been discussed with the patient and family. After consideration of risks, benefits and other options for treatment, the patient has consented to  Procedure(s): IVC FILTER REMOVAL (N/A) as a surgical intervention.  The patient's history has been reviewed, patient examined, no change in status, stable for surgery.  I have reviewed the patient's chart and labs.  Questions were answered to the patient's satisfaction.     Hortencia Pilar

## 2020-10-26 NOTE — Progress Notes (Signed)
MD in procedure. Patient states she does not want to wait to speak to MD and will speak with him at follow up appointment.

## 2020-11-08 ENCOUNTER — Ambulatory Visit (INDEPENDENT_AMBULATORY_CARE_PROVIDER_SITE_OTHER): Payer: Medicare PPO | Admitting: Vascular Surgery

## 2020-11-08 ENCOUNTER — Encounter (INDEPENDENT_AMBULATORY_CARE_PROVIDER_SITE_OTHER): Payer: Self-pay | Admitting: Vascular Surgery

## 2020-11-08 ENCOUNTER — Other Ambulatory Visit: Payer: Self-pay

## 2020-11-08 VITALS — BP 133/86 | HR 83 | Resp 16 | Wt 219.6 lb

## 2020-11-08 DIAGNOSIS — Z96642 Presence of left artificial hip joint: Secondary | ICD-10-CM | POA: Diagnosis not present

## 2020-11-08 DIAGNOSIS — I82519 Chronic embolism and thrombosis of unspecified femoral vein: Secondary | ICD-10-CM | POA: Diagnosis not present

## 2020-11-08 DIAGNOSIS — M1610 Unilateral primary osteoarthritis, unspecified hip: Secondary | ICD-10-CM

## 2020-11-08 NOTE — Progress Notes (Signed)
MRN : [id number]  Tucker Steedley is a 70 y.o. (1950/09/02) female who presents with chief complaint of talk about IVC filter removal.  History of Present Illness:   The patient is s/p attempted IVC filter removal but at the time of the procedure it was found that the hook was covered posteriorly.  Current Meds  Medication Sig   aspirin EC 81 MG tablet Take 81 mg by mouth daily. Swallow whole.   Carboxymethylcellul-Glycerin (REFRESH OPTIVE OP) Place 1 drop into both eyes 3 (three) times daily as needed (dry eyes).   clobetasol ointment (TEMOVATE) 9.32 % Apply 1 application topically 2 (two) times a week. lichen sclerosus (Mondays & Fridays)   EMGALITY 120 MG/ML SOAJ Inject 120 mg as directed every 30 (thirty) days.   Multiple Vitamins-Minerals (PRESERVISION AREDS 2 PO) Take 1 tablet by mouth in the morning and at bedtime.    Past Medical History:  Diagnosis Date   Arthritis    DVT (deep venous thrombosis) ([place]) 2015   pulmonary emboli   Headache    history of ileus 09/2020   History of kidney stones    Lichen sclerosus    Pneumonia 2015   PONV (postoperative nausea and vomiting)    Pulmonary emboli ([place]) 2015   Restless leg syndrome     Past Surgical History:  Procedure Laterality Date   CHOLECYSTECTOMY  1996   COLONOSCOPY  2015   CYST EXCISION Right 2010   pinkie finger   dcr Right    right eye   EXTRACORPOREAL SHOCK WAVE LITHOTRIPSY Bilateral 2006   twice on left and once on right   EXTRACORPOREAL SHOCK WAVE LITHOTRIPSY Right 07/25/2018   Procedure: EXTRACORPOREAL SHOCK WAVE LITHOTRIPSY (ESWL);  Surgeon: Billey Co, MD;  Location: [facility];  Service: Urology;  Laterality: Right;   EYE SURGERY Bilateral 2013   cataract extractions   HYSTEROSCOPY  2015   IVC FILTER INSERTION N/A 04/06/2020   Procedure: IVC FILTER INSERTION;  Surgeon: Katha Cabal, MD;  Location: [place] [facility];  Service: Cardiovascular;  Laterality: N/A;   IVC FILTER REMOVAL N/A  10/26/2020   Procedure: IVC FILTER REMOVAL;  Surgeon: Katha Cabal, MD;  Location: [place] [facility];  Service: Cardiovascular;  Laterality: N/A;   kidney stone removal Right 2006, 2013   right ureteroscopic stone removals   RETINAL TEAR REPAIR CRYOTHERAPY Bilateral    RIGHT 2007 AND 2008; LEFT 2012   TONSILLECTOMY  1984   TOTAL HIP ARTHROPLASTY Right 04/08/2020   Procedure: TOTAL HIP ARTHROPLASTY ANTERIOR APPROACH;  Surgeon: Hessie Knows, MD;  Location: [facility];  Service: Orthopedics;  Laterality: Right;   TOTAL HIP ARTHROPLASTY Left 08/12/2020   Procedure: TOTAL HIP ARTHROPLASTY ANTERIOR APPROACH;  Surgeon: Hessie Knows, MD;  Location: [facility];  Service: Orthopedics;  Laterality: Left;   TUBAL LIGATION  1985    Social History Social History   Tobacco Use   Smoking status: Never   Smokeless tobacco: Never  Vaping Use   Vaping Use: Never used  Substance Use Topics   Alcohol use: Not Currently   Drug use: Not Currently    Family History Family History  Problem Relation Age of Onset   Stroke Mother    Stroke Father    AAA (abdominal aortic aneurysm) Father    Prostate cancer Father    Hypertension Father     Allergies  Allergen Reactions   Ciprofloxacin Other (See Comments)    tendonitis   Penicillins Rash  Tolerated 2g Ancef   Quinolones Other (See Comments)    Caused pain & cramping Caused severe tendonitis in feet   Sulfa Antibiotics Rash     REVIEW OF SYSTEMS (Negative unless checked)  Constitutional: [] Weight loss  [] Fever  [] Chills Cardiac: [] Chest pain   [] Chest pressure   [] Palpitations   [] Shortness of breath when laying flat   [] Shortness of breath with exertion. Vascular:  [] Pain in legs with walking   [] Pain in legs at rest  [] History of DVT   [] Phlebitis   [x] Swelling in legs   [] Varicose veins   [] Non-healing ulcers Pulmonary:   [] Uses home oxygen   [] Productive cough   [] Hemoptysis   [] Wheeze  [] COPD   [] Asthma Neurologic:  [] Dizziness    [] Seizures   [] History of stroke   [] History of TIA  [] Aphasia   [] Vissual changes   [] Weakness or numbness in arm   [] Weakness or numbness in leg Musculoskeletal:   [] Joint swelling   [] Joint pain   [] Low back pain Hematologic:  [] Easy bruising  [] Easy bleeding   [] Hypercoagulable state   [] Anemic Gastrointestinal:  [] Diarrhea   [] Vomiting  [] Gastroesophageal reflux/heartburn   [] Difficulty swallowing. Genitourinary:  [] Chronic kidney disease   [] Difficult urination  [] Frequent urination   [] Blood in urine Skin:  [] Rashes   [] Ulcers  Psychological:  [] History of anxiety   []  History of major depression.  Physical Examination  Vitals:   11/08/20 1330  BP: 133/86  Pulse: 83  Resp: 16  Weight: 219 lb 9.6 oz (99.6 kg)   Body mass index is 36.54 kg/m. Gen: WD/WN, NAD Head: /AT, No temporalis wasting.  Ear/Nose/Throat: Hearing grossly intact, nares w/o erythema or drainage, pinna without lesions Eyes: PER, EOMI, sclera nonicteric.  Neck: Supple, no gross masses.  No JVD.  Pulmonary:  Good air movement, no audible wheezing, no use of accessory muscles.  Cardiac: RRR, precordium not hyperdynamic. Vascular:  scattered varicosities present bilaterally.  Mild venous stasis changes to the legs bilaterally.  2+ soft pitting edema  Vessel Right Left  Radial Palpable Palpable  Gastrointestinal: soft, non-distended. No guarding/no peritoneal signs.  Musculoskeletal: M/S 5/5 throughout.  No deformity.  Neurologic: CN 2-12 intact. Pain and light touch intact in extremities.  Symmetrical.  Speech is fluent. Motor exam as listed above. Psychiatric: Judgment intact, Mood & affect appropriate for pt's clinical situation. Dermatologic: Venous rashes no ulcers noted.  No changes consistent with cellulitis. Lymph : No lichenification or skin changes of chronic lymphedema.  CBC Lab Results  Component Value Date   WBC 10.2 09/19/2020   HGB 13.5 09/19/2020   HCT 41.2 09/19/2020   MCV 91.4  09/19/2020   PLT 285 09/19/2020    BMET    Component Value Date/Time   NA 140 09/19/2020 1616   K 3.8 09/19/2020 1616   CL 107 09/19/2020 1616   CO2 26 09/19/2020 1616   GLUCOSE 101 (H) 09/19/2020 1616   BUN 18 09/19/2020 1616   CREATININE 0.83 09/19/2020 1616   CALCIUM 9.2 09/19/2020 1616   GFRNONAA >60 09/19/2020 1616   CrCl cannot be calculated (Patient's most recent lab result is older than the maximum 21 days allowed.).  COAG No results found for: INR, PROTIME  Radiology PERIPHERAL VASCULAR CATHETERIZATION  Result Date: 10/26/2020 See surgical note for result.    Assessment/Plan 1. 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  Risk and benefits were reviewed the patient.  Indications for the procedure were reviewed.  All questions were answered, the patient agrees to proceed.    2. Arthritis, hip Continue NSAID medications as already ordered, these medications have been reviewed and there are no changes at this time.  Continued activity and therapy was stressed.   3. Status post total hip replacement, left Continue NSAID medications as already ordered, these medications have been reviewed and there are no changes at this time.  Continued activity and therapy was stressed.    Hortencia Pilar, MD  11/08/2020 1:46 PM

## 2020-11-08 NOTE — H&P (View-Only) (Signed)
MRN : [id number]  [facility] is a 70 y.o. (1950/07/05) female who presents with chief complaint of talk about IVC filter removal.  History of Present Illness:   The patient is s/p attempted IVC filter removal but at the time of the procedure it was found that the hook was covered posteriorly.  Current Meds  Medication Sig   aspirin EC 81 MG tablet Take 81 mg by mouth daily. Swallow whole.   Carboxymethylcellul-Glycerin (REFRESH OPTIVE OP) Place 1 drop into both eyes 3 (three) times daily as needed (dry eyes).   clobetasol ointment (TEMOVATE) 3.82 % Apply 1 application topically 2 (two) times a week. lichen sclerosus (Mondays & Fridays)   EMGALITY 120 MG/ML SOAJ Inject 120 mg as directed every 30 (thirty) days.   Multiple Vitamins-Minerals (PRESERVISION AREDS 2 PO) Take 1 tablet by mouth in the morning and at bedtime.    Past Medical History:  Diagnosis Date   Arthritis    DVT (deep venous thrombosis) ([place]) 2015   pulmonary emboli   Headache    history of ileus 09/2020   History of kidney stones    Lichen sclerosus    Pneumonia 2015   PONV (postoperative nausea and vomiting)    Pulmonary emboli ([place]) 2015   Restless leg syndrome     Past Surgical History:  Procedure Laterality Date   CHOLECYSTECTOMY  1996   COLONOSCOPY  2015   CYST EXCISION Right 2010   pinkie finger   dcr Right    right eye   EXTRACORPOREAL SHOCK WAVE LITHOTRIPSY Bilateral 2006   twice on left and once on right   EXTRACORPOREAL SHOCK WAVE LITHOTRIPSY Right 07/25/2018   Procedure: EXTRACORPOREAL SHOCK WAVE LITHOTRIPSY (ESWL);  Surgeon: Billey Co, MD;  Location: [facility];  Service: Urology;  Laterality: Right;   EYE SURGERY Bilateral 2013   cataract extractions   HYSTEROSCOPY  2015   IVC FILTER INSERTION N/A 04/06/2020   Procedure: IVC FILTER INSERTION;  Surgeon: Katha Cabal, MD;  Location: [place] [facility];  Service: Cardiovascular;  Laterality: N/A;   IVC FILTER REMOVAL N/A  10/26/2020   Procedure: IVC FILTER REMOVAL;  Surgeon: Katha Cabal, MD;  Location: [place] [facility];  Service: Cardiovascular;  Laterality: N/A;   kidney stone removal Right 2006, 2013   right ureteroscopic stone removals   RETINAL TEAR REPAIR CRYOTHERAPY Bilateral    RIGHT 2007 AND 2008; LEFT 2012   TONSILLECTOMY  1984   TOTAL HIP ARTHROPLASTY Right 04/08/2020   Procedure: TOTAL HIP ARTHROPLASTY ANTERIOR APPROACH;  Surgeon: Hessie Knows, MD;  Location: [facility];  Service: Orthopedics;  Laterality: Right;   TOTAL HIP ARTHROPLASTY Left 08/12/2020   Procedure: TOTAL HIP ARTHROPLASTY ANTERIOR APPROACH;  Surgeon: Hessie Knows, MD;  Location: [facility];  Service: Orthopedics;  Laterality: Left;   TUBAL LIGATION  1985    Social History Social History   Tobacco Use   Smoking status: Never   Smokeless tobacco: Never  Vaping Use   Vaping Use: Never used  Substance Use Topics   Alcohol use: Not Currently   Drug use: Not Currently    Family History Family History  Problem Relation Age of Onset   Stroke Mother    Stroke Father    AAA (abdominal aortic aneurysm) Father    Prostate cancer Father    Hypertension Father     Allergies  Allergen Reactions   Ciprofloxacin Other (See Comments)    tendonitis   Penicillins Rash  Tolerated 2g Ancef   Quinolones Other (See Comments)    Caused pain & cramping Caused severe tendonitis in feet   Sulfa Antibiotics Rash     REVIEW OF SYSTEMS (Negative unless checked)  Constitutional: [] Weight loss  [] Fever  [] Chills Cardiac: [] Chest pain   [] Chest pressure   [] Palpitations   [] Shortness of breath when laying flat   [] Shortness of breath with exertion. Vascular:  [] Pain in legs with walking   [] Pain in legs at rest  [] History of DVT   [] Phlebitis   [x] Swelling in legs   [] Varicose veins   [] Non-healing ulcers Pulmonary:   [] Uses home oxygen   [] Productive cough   [] Hemoptysis   [] Wheeze  [] COPD   [] Asthma Neurologic:  [] Dizziness    [] Seizures   [] History of stroke   [] History of TIA  [] Aphasia   [] Vissual changes   [] Weakness or numbness in arm   [] Weakness or numbness in leg Musculoskeletal:   [] Joint swelling   [] Joint pain   [] Low back pain Hematologic:  [] Easy bruising  [] Easy bleeding   [] Hypercoagulable state   [] Anemic Gastrointestinal:  [] Diarrhea   [] Vomiting  [] Gastroesophageal reflux/heartburn   [] Difficulty swallowing. Genitourinary:  [] Chronic kidney disease   [] Difficult urination  [] Frequent urination   [] Blood in urine Skin:  [] Rashes   [] Ulcers  Psychological:  [] History of anxiety   []  History of major depression.  Physical Examination  Vitals:   11/08/20 1330  BP: 133/86  Pulse: 83  Resp: 16  Weight: 219 lb 9.6 oz (99.6 kg)   Body mass index is 36.54 kg/m. Gen: WD/WN, NAD Head: [place]/AT, No temporalis wasting.  Ear/Nose/Throat: Hearing grossly intact, nares w/o erythema or drainage, pinna without lesions Eyes: PER, EOMI, sclera nonicteric.  Neck: Supple, no gross masses.  No JVD.  Pulmonary:  Good air movement, no audible wheezing, no use of accessory muscles.  Cardiac: RRR, precordium not hyperdynamic. Vascular:  scattered varicosities present bilaterally.  Mild venous stasis changes to the legs bilaterally.  2+ soft pitting edema  Vessel Right Left  Radial Palpable Palpable  Gastrointestinal: soft, non-distended. No guarding/no peritoneal signs.  Musculoskeletal: M/S 5/5 throughout.  No deformity.  Neurologic: CN 2-12 intact. Pain and light touch intact in extremities.  Symmetrical.  Speech is fluent. Motor exam as listed above. Psychiatric: Judgment intact, Mood & affect appropriate for pt's clinical situation. Dermatologic: Venous rashes no ulcers noted.  No changes consistent with cellulitis. Lymph : No lichenification or skin changes of chronic lymphedema.  CBC Lab Results  Component Value Date   WBC 10.2 09/19/2020   HGB 13.5 09/19/2020   HCT 41.2 09/19/2020   MCV 91.4  09/19/2020   PLT 285 09/19/2020    BMET    Component Value Date/Time   NA 140 09/19/2020 1616   K 3.8 09/19/2020 1616   CL 107 09/19/2020 1616   CO2 26 09/19/2020 1616   GLUCOSE 101 (H) 09/19/2020 1616   BUN 18 09/19/2020 1616   CREATININE 0.83 09/19/2020 1616   CALCIUM 9.2 09/19/2020 1616   GFRNONAA >60 09/19/2020 1616   CrCl cannot be calculated (Patient's most recent lab result is older than the maximum 21 days allowed.).  COAG No results found for: INR, PROTIME  Radiology PERIPHERAL VASCULAR CATHETERIZATION  Result Date: 10/26/2020 See surgical note for result.    Assessment/Plan 1. 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  Risk and benefits were reviewed the patient.  Indications for the procedure were reviewed.  All questions were answered, the patient agrees to proceed.    2. Arthritis, hip Continue NSAID medications as already ordered, these medications have been reviewed and there are no changes at this time.  Continued activity and therapy was stressed.   3. Status post total hip replacement, left Continue NSAID medications as already ordered, these medications have been reviewed and there are no changes at this time.  Continued activity and therapy was stressed.    Hortencia Pilar, MD  11/08/2020 1:46 PM

## 2020-11-11 ENCOUNTER — Telehealth (INDEPENDENT_AMBULATORY_CARE_PROVIDER_SITE_OTHER): Payer: Self-pay

## 2020-11-11 NOTE — Telephone Encounter (Signed)
Spoke with the patient and she is scheduled with Dr. Delana Meyer for a IVC filter removal on 11/24/20 with a 6:45 am arrival time to the MM. Pre-procedure instructions were discussed and will be mailed. Anesthesia has been scheduled through [facility] as well.

## 2020-11-12 NOTE — Telephone Encounter (Signed)
ERROR

## 2020-11-17 ENCOUNTER — Other Ambulatory Visit: Payer: Self-pay

## 2020-11-17 ENCOUNTER — Ambulatory Visit
Admission: RE | Admit: 2020-11-17 | Discharge: 2020-11-17 | Disposition: A | Discharge status: Home / Self Care | Payer: Medicare PPO | Source: Ambulatory Visit | Attending: Obstetrics and Gynecology | Admitting: Obstetrics and Gynecology

## 2020-11-17 DIAGNOSIS — Z1231 Encounter for screening mammogram for malignant neoplasm of breast: Secondary | ICD-10-CM | POA: Insufficient documentation

## 2020-11-23 ENCOUNTER — Other Ambulatory Visit (INDEPENDENT_AMBULATORY_CARE_PROVIDER_SITE_OTHER): Payer: Self-pay | Admitting: Nurse Practitioner

## 2020-11-23 DIAGNOSIS — I82519 Chronic embolism and thrombosis of unspecified femoral vein: Secondary | ICD-10-CM

## 2020-11-24 ENCOUNTER — Ambulatory Visit
Admission: RE | Disposition: A | Discharge status: Home / Self Care | Payer: Self-pay | Source: Home / Self Care | Attending: Vascular Surgery

## 2020-11-24 ENCOUNTER — Ambulatory Visit: Payer: Medicare PPO

## 2020-11-24 ENCOUNTER — Encounter: Payer: Self-pay | Admitting: Vascular Surgery

## 2020-11-24 ENCOUNTER — Other Ambulatory Visit: Payer: Self-pay

## 2020-11-24 ENCOUNTER — Ambulatory Visit
Admission: RE | Admit: 2020-11-24 | Discharge: 2020-11-24 | Disposition: A | Discharge status: Home / Self Care | Payer: Medicare PPO | Attending: Vascular Surgery | Admitting: Vascular Surgery

## 2020-11-24 DIAGNOSIS — M169 Osteoarthritis of hip, unspecified: Secondary | ICD-10-CM | POA: Diagnosis not present

## 2020-11-24 DIAGNOSIS — Z86718 Personal history of other venous thrombosis and embolism: Secondary | ICD-10-CM

## 2020-11-24 DIAGNOSIS — I82519 Chronic embolism and thrombosis of unspecified femoral vein: Secondary | ICD-10-CM | POA: Diagnosis not present

## 2020-11-24 DIAGNOSIS — I82409 Acute embolism and thrombosis of unspecified deep veins of unspecified lower extremity: Secondary | ICD-10-CM

## 2020-11-24 DIAGNOSIS — Z4589 Encounter for adjustment and management of other implanted devices: Secondary | ICD-10-CM | POA: Insufficient documentation

## 2020-11-24 HISTORY — PX: IVC FILTER REMOVAL: CATH118246

## 2020-11-24 LAB — CREATININE, SERUM
Creatinine, Ser: 0.73 mg/dL (ref 0.44–1.00)
GFR, Estimated: >60 mL/min (ref >60)

## 2020-11-24 LAB — BUN: BUN: 15 mg/dL (ref 8–23)

## 2020-11-24 SURGERY — IVC FILTER REMOVAL
Anesthesia: General

## 2020-11-24 MED ORDER — MIDAZOLAM HCL 2 MG/ML PO SYRP: 8.0000 mg | ORAL_SOLUTION | Freq: Once | ORAL | Status: DC | PRN

## 2020-11-24 MED ORDER — FENTANYL CITRATE (PF) 100 MCG/2ML IJ SOLN
INTRAMUSCULAR | Status: AC
  Filled 2020-11-24: qty 2

## 2020-11-24 MED ORDER — PROPOFOL 500 MG/50ML IV EMUL
INTRAVENOUS | Status: AC
  Filled 2020-11-24: qty 50

## 2020-11-24 MED ORDER — GLYCOPYRROLATE 0.2 MG/ML IJ SOLN
INTRAMUSCULAR | Status: AC
  Filled 2020-11-24: qty 1

## 2020-11-24 MED ORDER — PHENYLEPHRINE HCL (PRESSORS) 10 MG/ML IV SOLN
INTRAVENOUS | Status: AC
  Filled 2020-11-24: qty 1

## 2020-11-24 MED ORDER — FAMOTIDINE 20 MG PO TABS: 40.0000 mg | ORAL_TABLET | Freq: Once | ORAL | Status: DC | PRN

## 2020-11-24 MED ORDER — DIPHENHYDRAMINE HCL 50 MG/ML IJ SOLN: 50.0000 mg | Freq: Once | INTRAMUSCULAR | Status: DC | PRN

## 2020-11-24 MED ORDER — PROPOFOL 10 MG/ML IV BOLUS
INTRAVENOUS | Status: AC
  Filled 2020-11-24: qty 20

## 2020-11-24 MED ORDER — DEXMEDETOMIDINE HCL IN NACL 200 MCG/50ML IV SOLN
INTRAVENOUS | Status: AC
  Filled 2020-11-24: qty 50

## 2020-11-24 MED ORDER — MIDAZOLAM HCL 2 MG/2ML IJ SOLN
INTRAMUSCULAR | Status: AC
  Filled 2020-11-24: qty 2

## 2020-11-24 MED ORDER — FENTANYL CITRATE (PF) 100 MCG/2ML IJ SOLN: 25.0000 ug | INTRAMUSCULAR | Status: DC | PRN

## 2020-11-24 MED ORDER — HEPARIN SODIUM (PORCINE) 1000 UNIT/ML IJ SOLN
INTRAMUSCULAR | Status: AC
  Filled 2020-11-24: qty 1

## 2020-11-24 MED ORDER — DEXMEDETOMIDINE (PRECEDEX) IN NS 20 MCG/5ML (4 MCG/ML) IV SYRINGE
PREFILLED_SYRINGE | INTRAVENOUS | Status: DC | PRN
  Administered 2020-11-24: 4 ug via INTRAVENOUS
  Administered 2020-11-24: 8 ug via INTRAVENOUS

## 2020-11-24 MED ORDER — CLINDAMYCIN PHOSPHATE 300 MG/50ML IV SOLN
INTRAVENOUS | Status: AC
  Filled 2020-11-24: qty 50

## 2020-11-24 MED ORDER — CLINDAMYCIN PHOSPHATE 300 MG/50ML IV SOLN: 300.0000 mg | Freq: Once | INTRAVENOUS | Status: DC

## 2020-11-24 MED ORDER — EPHEDRINE SULFATE 50 MG/ML IJ SOLN
INTRAMUSCULAR | Status: DC | PRN
  Administered 2020-11-24: 10 mg via INTRAVENOUS

## 2020-11-24 MED ORDER — IODIXANOL 320 MG/ML IV SOLN
INTRAVENOUS | Status: DC | PRN
  Administered 2020-11-24: 15 mL via INTRAVENOUS

## 2020-11-24 MED ORDER — ONDANSETRON HCL 4 MG/2ML IJ SOLN
4.0000 mg | Freq: Four times a day (QID) | INTRAMUSCULAR | Status: DC | PRN
  Administered 2020-11-24: 4 mg via INTRAVENOUS

## 2020-11-24 MED ORDER — SODIUM CHLORIDE 0.9 % IV SOLN: INTRAVENOUS | Status: DC

## 2020-11-24 MED ORDER — HYDROMORPHONE HCL 1 MG/ML IJ SOLN: 1.0000 mg | Freq: Once | INTRAMUSCULAR | Status: DC | PRN

## 2020-11-24 MED ORDER — ONDANSETRON HCL 4 MG/2ML IJ SOLN
INTRAMUSCULAR | Status: AC
  Filled 2020-11-24: qty 2

## 2020-11-24 MED ORDER — PHENYLEPHRINE HCL-NACL 20-0.9 MG/250ML-% IV SOLN
INTRAVENOUS | Status: DC | PRN
  Administered 2020-11-24: 30 ug/min via INTRAVENOUS

## 2020-11-24 MED ORDER — METHYLPREDNISOLONE SODIUM SUCC 125 MG IJ SOLR: 125.0000 mg | Freq: Once | INTRAMUSCULAR | Status: DC | PRN

## 2020-11-24 MED ORDER — ACETAMINOPHEN 10 MG/ML IV SOLN: 1000.0000 mg | Freq: Once | INTRAVENOUS | Status: DC | PRN

## 2020-11-24 MED ORDER — GLYCOPYRROLATE 0.2 MG/ML IJ SOLN
INTRAMUSCULAR | Status: DC | PRN
  Administered 2020-11-24: .2 mg via INTRAVENOUS

## 2020-11-24 MED ORDER — ONDANSETRON HCL 4 MG/2ML IJ SOLN: 4.0000 mg | Freq: Once | INTRAMUSCULAR | Status: DC | PRN

## 2020-11-24 MED ORDER — PROPOFOL 500 MG/50ML IV EMUL
INTRAVENOUS | Status: DC | PRN
  Administered 2020-11-24: 50 ug/kg/min via INTRAVENOUS

## 2020-11-24 MED ORDER — CEFAZOLIN SODIUM-DEXTROSE 2-3 GM-%(50ML) IV SOLR
INTRAVENOUS | Status: DC | PRN
  Administered 2020-11-24: 2 g via INTRAVENOUS

## 2020-11-24 MED ORDER — LIDOCAINE HCL (PF) 2 % IJ SOLN
INTRAMUSCULAR | Status: AC
  Filled 2020-11-24: qty 5

## 2020-11-24 MED ORDER — PHENYLEPHRINE HCL (PRESSORS) 10 MG/ML IV SOLN
INTRAVENOUS | Status: DC | PRN
  Administered 2020-11-24 (×3): 100 ug via INTRAVENOUS
  Administered 2020-11-24: 50 ug via INTRAVENOUS

## 2020-11-24 MED ORDER — MIDAZOLAM HCL 2 MG/2ML IJ SOLN
INTRAMUSCULAR | Status: DC | PRN
  Administered 2020-11-24: 2 mg via INTRAVENOUS

## 2020-11-24 MED ORDER — FENTANYL CITRATE (PF) 100 MCG/2ML IJ SOLN
INTRAMUSCULAR | Status: DC | PRN
  Administered 2020-11-24 (×2): 25 ug via INTRAVENOUS

## 2020-11-24 SURGICAL SUPPLY — 18 items
CANNULA 5F STIFF (CANNULA) ×1 IMPLANT
CATH ANGIO 5F PIGTAIL 65CM (CATHETERS) ×1 IMPLANT
CATH BEACON 5 .035 65 KMP TIP (CATHETERS) ×1 IMPLANT
CATH MACH1 6X55 (CATHETERS) ×1 IMPLANT
COVER PROBE U/S 5X48 (MISCELLANEOUS) ×1 IMPLANT
DEVICE TORQUE .025-.038 (MISCELLANEOUS) ×1 IMPLANT
DRAPE BRACHIAL (DRAPES) ×2 IMPLANT
DRYSEAL FLEXSHEATH 16FR 33CM (SHEATH) ×1
ENSNARE 9-15 (MISCELLANEOUS) ×1 IMPLANT
GUIDEWIRE ANGLED .035X260CM (WIRE) ×1 IMPLANT
GUIDEWIRE SUPER STIFF .035X180 (WIRE) ×1 IMPLANT
PACK ANGIOGRAPHY (CUSTOM PROCEDURE TRAY) ×2 IMPLANT
SHEATH  FLEX ANSEL 8FRX45 (SHEATH) ×1
SHEATH BRITE TIP 5FRX11 (SHEATH) ×1 IMPLANT
SHEATH DRYSEAL FLEX 16FR 33CM (SHEATH) IMPLANT
SHEATH FLEX ANSEL 8FRX45 (SHEATH) IMPLANT
VALVE HEMO TOUHY BORST Y (ADAPTER) ×1 IMPLANT
WIRE GUIDERIGHT .035X150 (WIRE) ×1 IMPLANT

## 2020-11-24 NOTE — Anesthesia Postprocedure Evaluation (Signed)
Anesthesia Post Note  Patient: Savannah Wilkins  Procedure(s) Performed: IVC FILTER REMOVAL  Patient location during evaluation: PACU Anesthesia Type: General Level of consciousness: awake and alert, oriented and patient cooperative Pain management: pain level controlled Vital Signs Assessment: post-procedure vital signs reviewed and stable Respiratory status: spontaneous breathing, nonlabored ventilation and respiratory function stable Cardiovascular status: blood pressure returned to baseline and stable Postop Assessment: adequate PO intake Anesthetic complications: no   No notable events documented.   Last Vitals:  Vitals:   11/24/20 1000 11/24/20 1015  BP: 133/73   Pulse: 74 64  Resp: 18 12  Temp:    SpO2: 99% 97%    Last Pain:  Vitals:   11/24/20 1015  TempSrc:   PainSc: Asleep                 Darrin Nipper

## 2020-11-24 NOTE — Op Note (Signed)
  OPERATIVE NOTE   PRE-OPERATIVE DIAGNOSIS: History of DVT; degenerative joint disease status post successful joint replacement surgery  POST-OPERATIVE DIAGNOSIS: Same  PROCEDURE: Retrieval of IVC Filter Inferior Vena Cavagram  SURGEON: Katha Cabal, M.D.  ANESTHESIA: General anesthesia  ESTIMATED BLOOD LOSS: Minimal cc  FINDING(S):inferior vena cava is widely patent filter is in place in good position. Filter is removed without incident  SPECIMEN(S):  IVC filter intact  INDICATIONS:   Savannah Wilkins is a 70 y.o. female who presents with DVT and PE. The patient underwent attempted removal but it was found that the hook had embedded in the wall posteriorly.  She is now returning for a second attempt with anesthesia. Therefore, the IVC filter is recommended to be removed. The risks and benefits were reviewed with the patient all questions were answered and they agreed to proceed with IVC filter retrieval.   DESCRIPTION: After obtaining full informed written consent, the patient was brought back to the Special Procedure Suite and placed in the supine position.  The patient received IV antibiotics prior to induction.  After obtaining adequate sedation, the patient was prepped and draped in the standard fashion and appropriate time out is called.     Ultrasound was placed in a sterile sleeve.The right neck was then imaged with ultrasound.   Jugular vein was identified it is echolucent and homogeneous indicating patency. 1% lidocaine is then infiltrated under ultrasound visualization and subsequently a micropuncture needle is inserted under real-time ultrasound guidance.  Images recorded for the permanent record.  Micro sheath was then inserted followed by a J-wire.    J-wire and Kumpe catheter is then advanced into the inferior vena cava and then into the left common iliac vein under fluoroscopic guidance. With the tip of the sheath at the confluence of the iliac veins inferior vena caval  imaging is performed.  The Amplatz Super Stiff wire was then advanced through the Kumpe catheter and the catheter was removed.  The 16 French dry seal sheath is then advanced under fluoroscopic guidance position with its tip several centimeters above the filter.  Using a biopsy forceps and an 8 French Ansell sheath through the dry seal sheath the filter is then grasped and gentle pressure is applied down.  The 8 French sheath is then slid over the hook.  This proves the hook is been freed from the posterior wall.  The filter was then grafted with a try lobe snare and then the filter is then collapsed within the 8 French sheath sheath and removed without difficulty.  The dry seal sheath is removed and pressure is held the patient tolerated the procedure well and there were no immediate complications.  Interpretation: inferior vena cava is widely patent filter is in place in good position. Filter is removed without incident.     COMPLICATIONS: None  CONDITION: Savannah Wilkins, M.D. [facility] Office: [phone]   11/24/2020, 9:33 AM

## 2020-11-24 NOTE — Transfer of Care (Signed)
Immediate Anesthesia Transfer of Care Note  Patient: Savannah Wilkins  Procedure(s) Performed: IVC FILTER REMOVAL  Patient Location: spu  Anesthesia Type:General  Level of Consciousness: awake  Airway & Oxygen Therapy: Patient Spontanous Breathing and Patient connected to face mask oxygen  Post-op Assessment: Report given to RN and Post -op Vital signs reviewed and stable  Post vital signs: Reviewed  Last Vitals:  Vitals Value Taken Time  BP 116/70 11/24/20 0918  Temp    Pulse 71 11/24/20 0920  Resp 12 11/24/20 0920  SpO2 100 % 11/24/20 0920  Vitals shown include unvalidated device data.  Last Pain:  Vitals:   11/24/20 0918  TempSrc:   PainSc: Asleep         Complications: No notable events documented.

## 2020-11-24 NOTE — Interval H&P Note (Signed)
History and Physical Interval Note:  11/24/2020 7:52 AM  Savannah Wilkins  has presented today for surgery, with the diagnosis of IVC Filter Removal   DVT   ANESTHESIA  Okd Kendall.  The various methods of treatment have been discussed with the patient and family. After consideration of risks, benefits and other options for treatment, the patient has consented to  Procedure(s): IVC FILTER REMOVAL (N/A) as a surgical intervention.  The patient's history has been reviewed, patient examined, no change in status, stable for surgery.  I have reviewed the patient's chart and labs.  Questions were answered to the patient's satisfaction.     Hortencia Pilar

## 2020-11-24 NOTE — Anesthesia Preprocedure Evaluation (Signed)
Anesthesia Evaluation  Patient identified by MRN, date of birth, ID band Patient awake    Reviewed: Allergy & Precautions, NPO status , Patient's Chart, lab work & pertinent test results  History of Anesthesia Complications (+) PONV and history of anesthetic complications  Airway Mallampati: II  TM Distance: >3 FB Neck ROM: Full    Dental no notable dental hx. (+) Teeth Intact, Caps   Pulmonary neg sleep apnea, neg COPD, Patient abstained from smoking.Not current smoker, PE   Pulmonary exam normal breath sounds clear to auscultation       Cardiovascular Exercise Tolerance: Good METS(-) hypertension+ DVT  (-) CAD and (-) Past MI (-) dysrhythmias  Rhythm:Regular Rate:Normal - Systolic murmurs    Neuro/Psych  Headaches, negative psych ROS   GI/Hepatic neg GERD  ,(+)     (-) substance abuse  ,   Endo/Other  neg diabetes  Renal/GU negative Renal ROS     Musculoskeletal   Abdominal   Peds  Hematology   Anesthesia Other Findings Past Medical History: No date: Arthritis 2015: DVT (deep venous thrombosis) (HCC)     Comment:  pulmonary emboli No date: Headache 09/2020: history of ileus No date: History of kidney stones No date: Lichen sclerosus 2297: Pneumonia No date: PONV (postoperative nausea and vomiting) 2015: Pulmonary emboli (HCC) No date: Restless leg syndrome  Reproductive/Obstetrics                             Anesthesia Physical Anesthesia Plan  ASA: 3  Anesthesia Plan: General   Post-op Pain Management:    Induction: Intravenous  PONV Risk Score and Plan: 4 or greater and Ondansetron, Dexamethasone, Midazolam, TIVA and Propofol infusion  Airway Management Planned: Natural Airway and Oral ETT  Additional Equipment: None  Intra-op Plan:   Post-operative Plan: Extubation in OR  Informed Consent: I have reviewed the patients History and Physical, chart, labs and  discussed the procedure including the risks, benefits and alternatives for the proposed anesthesia with the patient or authorized representative who has indicated his/her understanding and acceptance.     Dental advisory given  Plan Discussed with: CRNA and Surgeon  Anesthesia Plan Comments: (Will start with natural airway + prop gtt, airway intervention available if indicated for surgical reasons. Explained this to patient and she is in agreement. Discussed risks of anesthesia with patient, including PONV, sore throat, lip/dental damage. Rare risks discussed as well, such as cardiorespiratory and neurological sequelae, and allergic reactions. Discussed the role of CRNA in patient's perioperative care. Patient understands.)        Anesthesia Quick Evaluation

## 2020-12-14 NOTE — Progress Notes (Signed)
MRN : [id number]  Savannah Wilkins is a 70 y.o. (02-03-1950) female who presents with chief complaint of post operative IVC filter removal.  History of Present Illness: The patient underwent IVC filter removal on 11/24/2020 due to having a history of DVT prior to having her hip replacement done.  The patient notes that the filter will have developed attempted twice however she is doing fine after both interventions.  Today the wound is completely healed with no complaints postprocedure.  Current Meds  Medication Sig   aspirin EC 81 MG tablet Take 81 mg by mouth every evening. Swallow whole.   Carboxymethylcellul-Glycerin (REFRESH OPTIVE OP) Place 1 drop into both eyes 3 (three) times daily as needed (dry eyes).   clobetasol ointment (TEMOVATE) 4.09 % Apply 1 application topically 2 (two) times a week. lichen sclerosus (Mondays & Fridays)   EMGALITY 120 MG/ML SOAJ Inject 120 mg as directed every 30 (thirty) days.   Multiple Vitamins-Minerals (PRESERVISION AREDS 2 PO) Take 1 tablet by mouth in the morning and at bedtime.    Past Medical History:  Diagnosis Date   Arthritis    DVT (deep venous thrombosis) ([place]) 2015   pulmonary emboli   Headache    history of ileus 09/2020   History of kidney stones    Lichen sclerosus    Pneumonia 2015   PONV (postoperative nausea and vomiting)    Pulmonary emboli ([place]) 2015   Restless leg syndrome     Past Surgical History:  Procedure Laterality Date   CHOLECYSTECTOMY  1996   COLONOSCOPY  2015   CYST EXCISION Right 2010   pinkie finger   dcr Right    right eye   EXTRACORPOREAL SHOCK WAVE LITHOTRIPSY Bilateral 2006   twice on left and once on right   EXTRACORPOREAL SHOCK WAVE LITHOTRIPSY Right 07/25/2018   Procedure: EXTRACORPOREAL SHOCK WAVE LITHOTRIPSY (ESWL);  Surgeon: Billey Co, MD;  Location: [facility];  Service: Urology;  Laterality: Right;   EYE SURGERY Bilateral 2013   cataract extractions   HYSTEROSCOPY  2015   IVC FILTER  INSERTION N/A 04/06/2020   Procedure: IVC FILTER INSERTION;  Surgeon: Katha Cabal, MD;  Location: [place] [facility];  Service: Cardiovascular;  Laterality: N/A;   IVC FILTER REMOVAL N/A 10/26/2020   Procedure: IVC FILTER REMOVAL;  Surgeon: Katha Cabal, MD;  Location: [place] [facility];  Service: Cardiovascular;  Laterality: N/A;   IVC FILTER REMOVAL N/A 11/24/2020   Procedure: IVC FILTER REMOVAL;  Surgeon: Katha Cabal, MD;  Location: [place] [facility];  Service: Cardiovascular;  Laterality: N/A;   kidney stone removal Right 2006, 2013   right ureteroscopic stone removals   RETINAL TEAR REPAIR CRYOTHERAPY Bilateral    RIGHT 2007 AND 2008; LEFT 2012   TONSILLECTOMY  1984   TOTAL HIP ARTHROPLASTY Right 04/08/2020   Procedure: TOTAL HIP ARTHROPLASTY ANTERIOR APPROACH;  Surgeon: Hessie Knows, MD;  Location: [facility];  Service: Orthopedics;  Laterality: Right;   TOTAL HIP ARTHROPLASTY Left 08/12/2020   Procedure: TOTAL HIP ARTHROPLASTY ANTERIOR APPROACH;  Surgeon: Hessie Knows, MD;  Location: [facility];  Service: Orthopedics;  Laterality: Left;   TUBAL LIGATION  1985    Social History Social History   Tobacco Use   Smoking status: Never   Smokeless tobacco: Never  Vaping Use   Vaping Use: Never used  Substance Use Topics   Alcohol use: Not Currently   Drug use: Not Currently    Family History  Family History  Problem Relation Age of Onset   Stroke Mother    Stroke Father    AAA (abdominal aortic aneurysm) Father    Prostate cancer Father    Hypertension Father    Breast cancer Neg Hx     Allergies  Allergen Reactions   Levaquin [Levofloxacin]    Ciprofloxacin Other (See Comments)    tendonitis   Penicillins Rash    Tolerated 2g Ancef   Quinolones Other (See Comments)    Caused pain & cramping Caused severe tendonitis in feet   Sulfa Antibiotics Rash     REVIEW OF SYSTEMS (Negative unless checked)  Constitutional: [] Weight loss  [] Fever   [] Chills Cardiac: [] Chest pain   [] Chest pressure   [] Palpitations   [] Shortness of breath when laying flat   [] Shortness of breath with exertion. Vascular:  [] Pain in legs with walking   [] Pain in legs at rest  [] History of DVT   [] Phlebitis   [x] Swelling in legs   [] Varicose veins   [] Non-healing ulcers Pulmonary:   [] Uses home oxygen   [] Productive cough   [] Hemoptysis   [] Wheeze  [] COPD   [] Asthma Neurologic:  [] Dizziness   [] Seizures   [] History of stroke   [] History of TIA  [] Aphasia   [] Vissual changes   [] Weakness or numbness in arm   [] Weakness or numbness in leg Musculoskeletal:   [] Joint swelling   [] Joint pain   [] Low back pain Hematologic:  [] Easy bruising  [] Easy bleeding   [] Hypercoagulable state   [] Anemic Gastrointestinal:  [] Diarrhea   [] Vomiting  [] Gastroesophageal reflux/heartburn   [] Difficulty swallowing. Genitourinary:  [] Chronic kidney disease   [] Difficult urination  [] Frequent urination   [] Blood in urine Skin:  [] Rashes   [] Ulcers  Psychological:  [] History of anxiety   []  History of major depression.  Physical Examination  Vitals:   12/16/20 1319  BP: 126/86  Pulse: 72  Resp: 16  Weight: 222 lb 9.6 oz (101 kg)   Body mass index is 37.04 kg/m. Gen: WD/WN, NAD Head: /AT, No temporalis wasting.  Ear/Nose/Throat: Hearing grossly intact, nares w/o erythema or drainage, pinna without lesions Eyes: PER, EOMI, sclera nonicteric.  Neck: Supple, no gross masses.  No JVD.  Pulmonary:  Good air movement, no audible wheezing, no use of accessory muscles.  Cardiac: RRR, precordium not hyperdynamic.  Gastrointestinal: soft, non-distended. No guarding/no peritoneal signs.  Musculoskeletal: M/S 5/5 throughout.  No deformity.  Neurologic: CN 2-12 intact. Pain and light touch intact in extremities.  Symmetrical.  Speech is fluent. Motor exam as listed above. Psychiatric: Judgment intact, Mood & affect appropriate for pt's clinical situation.  CBC Lab Results   Component Value Date   WBC 10.2 09/19/2020   HGB 13.5 09/19/2020   HCT 41.2 09/19/2020   MCV 91.4 09/19/2020   PLT 285 09/19/2020    BMET    Component Value Date/Time   NA 140 09/19/2020 1616   K 3.8 09/19/2020 1616   CL 107 09/19/2020 1616   CO2 26 09/19/2020 1616   GLUCOSE 101 (H) 09/19/2020 1616   BUN 15 11/24/2020 0711   CREATININE 0.73 11/24/2020 0711   CALCIUM 9.2 09/19/2020 1616   GFRNONAA >60 11/24/2020 0711   CrCl cannot be calculated (Patient's most recent lab result is older than the maximum 21 days allowed.).  COAG No results found for: INR, PROTIME  Radiology No results found.   Assessment/Plan 1. 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  There are no issues or complaints post intervention.  Patient will follow-up with [place] on an as-needed basis.    Kris Hartmann, NP  12/27/2020 2:01 AM

## 2020-12-16 ENCOUNTER — Encounter (INDEPENDENT_AMBULATORY_CARE_PROVIDER_SITE_OTHER): Payer: Self-pay | Admitting: Nurse Practitioner

## 2020-12-16 ENCOUNTER — Ambulatory Visit (INDEPENDENT_AMBULATORY_CARE_PROVIDER_SITE_OTHER): Payer: Medicare PPO | Admitting: Nurse Practitioner

## 2020-12-16 ENCOUNTER — Other Ambulatory Visit: Payer: Self-pay

## 2020-12-16 VITALS — BP 126/86 | HR 72 | Resp 16 | Wt 222.6 lb

## 2020-12-16 DIAGNOSIS — I82519 Chronic embolism and thrombosis of unspecified femoral vein: Secondary | ICD-10-CM

## 2020-12-27 ENCOUNTER — Encounter (INDEPENDENT_AMBULATORY_CARE_PROVIDER_SITE_OTHER): Payer: Self-pay | Admitting: Nurse Practitioner

## 2021-01-12 ENCOUNTER — Other Ambulatory Visit: Payer: Self-pay | Admitting: Student

## 2021-01-12 DIAGNOSIS — M898X9 Other specified disorders of bone, unspecified site: Secondary | ICD-10-CM

## 2021-01-12 DIAGNOSIS — M7581 Other shoulder lesions, right shoulder: Secondary | ICD-10-CM

## 2021-01-12 DIAGNOSIS — M19011 Primary osteoarthritis, right shoulder: Secondary | ICD-10-CM

## 2021-01-23 ENCOUNTER — Other Ambulatory Visit: Payer: Self-pay

## 2021-01-23 ENCOUNTER — Ambulatory Visit
Admission: RE | Admit: 2021-01-23 | Discharge: 2021-01-23 | Disposition: A | Discharge status: Home / Self Care | Payer: Medicare Other | Source: Ambulatory Visit | Attending: Student | Admitting: Student

## 2021-01-23 DIAGNOSIS — Z96643 Presence of artificial hip joint, bilateral: Secondary | ICD-10-CM | POA: Diagnosis not present

## 2021-01-23 DIAGNOSIS — M7581 Other shoulder lesions, right shoulder: Secondary | ICD-10-CM | POA: Insufficient documentation

## 2021-01-23 DIAGNOSIS — R519 Headache, unspecified: Secondary | ICD-10-CM | POA: Diagnosis not present

## 2021-01-23 DIAGNOSIS — Z823 Family history of stroke: Secondary | ICD-10-CM | POA: Diagnosis not present

## 2021-01-23 DIAGNOSIS — M272 Inflammatory conditions of jaws: Secondary | ICD-10-CM | POA: Diagnosis not present

## 2021-01-23 DIAGNOSIS — Z86718 Personal history of other venous thrombosis and embolism: Secondary | ICD-10-CM | POA: Diagnosis not present

## 2021-01-23 DIAGNOSIS — Z6836 Body mass index (BMI) 36.0-36.9, adult: Secondary | ICD-10-CM | POA: Diagnosis not present

## 2021-01-23 DIAGNOSIS — Z880 Allergy status to penicillin: Secondary | ICD-10-CM | POA: Diagnosis not present

## 2021-01-23 DIAGNOSIS — Z8249 Family history of ischemic heart disease and other diseases of the circulatory system: Secondary | ICD-10-CM | POA: Diagnosis not present

## 2021-01-23 DIAGNOSIS — M19011 Primary osteoarthritis, right shoulder: Secondary | ICD-10-CM

## 2021-01-23 DIAGNOSIS — M898X9 Other specified disorders of bone, unspecified site: Secondary | ICD-10-CM | POA: Insufficient documentation

## 2021-01-23 DIAGNOSIS — Z8042 Family history of malignant neoplasm of prostate: Secondary | ICD-10-CM | POA: Diagnosis not present

## 2021-01-23 DIAGNOSIS — M25511 Pain in right shoulder: Secondary | ICD-10-CM | POA: Diagnosis not present

## 2021-01-23 DIAGNOSIS — Z79899 Other long term (current) drug therapy: Secondary | ICD-10-CM | POA: Diagnosis not present

## 2021-01-23 DIAGNOSIS — Z888 Allergy status to other drugs, medicaments and biological substances status: Secondary | ICD-10-CM | POA: Diagnosis not present

## 2021-01-23 DIAGNOSIS — Z882 Allergy status to sulfonamides status: Secondary | ICD-10-CM | POA: Diagnosis not present

## 2021-01-23 DIAGNOSIS — E669 Obesity, unspecified: Secondary | ICD-10-CM | POA: Diagnosis not present

## 2021-01-23 DIAGNOSIS — K115 Sialolithiasis: Secondary | ICD-10-CM | POA: Diagnosis not present

## 2021-01-23 DIAGNOSIS — Z8701 Personal history of pneumonia (recurrent): Secondary | ICD-10-CM | POA: Diagnosis not present

## 2021-01-23 DIAGNOSIS — Z7982 Long term (current) use of aspirin: Secondary | ICD-10-CM | POA: Diagnosis not present

## 2021-01-23 DIAGNOSIS — Z20822 Contact with and (suspected) exposure to covid-19: Secondary | ICD-10-CM | POA: Diagnosis not present

## 2021-01-23 DIAGNOSIS — Z86711 Personal history of pulmonary embolism: Secondary | ICD-10-CM | POA: Diagnosis not present

## 2021-01-23 DIAGNOSIS — K1120 Sialoadenitis, unspecified: Secondary | ICD-10-CM | POA: Diagnosis present

## 2021-01-23 DIAGNOSIS — G2581 Restless legs syndrome: Secondary | ICD-10-CM | POA: Diagnosis not present

## 2021-01-25 ENCOUNTER — Other Ambulatory Visit: Payer: Self-pay

## 2021-01-25 ENCOUNTER — Emergency Department: Payer: Medicare Other

## 2021-01-25 ENCOUNTER — Inpatient Hospital Stay
Admission: EM | Admit: 2021-01-25 | Discharge: 2021-01-27 | DRG: 156 | Disposition: A | Discharge status: Home / Self Care | Payer: Medicare Other | Attending: Internal Medicine | Admitting: Internal Medicine

## 2021-01-25 DIAGNOSIS — Z86718 Personal history of other venous thrombosis and embolism: Secondary | ICD-10-CM | POA: Diagnosis not present

## 2021-01-25 DIAGNOSIS — G8929 Other chronic pain: Secondary | ICD-10-CM | POA: Diagnosis not present

## 2021-01-25 DIAGNOSIS — K1120 Sialoadenitis, unspecified: Secondary | ICD-10-CM

## 2021-01-25 DIAGNOSIS — Z880 Allergy status to penicillin: Secondary | ICD-10-CM | POA: Diagnosis not present

## 2021-01-25 DIAGNOSIS — K115 Sialolithiasis: Principal | ICD-10-CM

## 2021-01-25 DIAGNOSIS — M25511 Pain in right shoulder: Secondary | ICD-10-CM | POA: Diagnosis not present

## 2021-01-25 DIAGNOSIS — Z8249 Family history of ischemic heart disease and other diseases of the circulatory system: Secondary | ICD-10-CM | POA: Diagnosis not present

## 2021-01-25 DIAGNOSIS — E669 Obesity, unspecified: Secondary | ICD-10-CM | POA: Diagnosis not present

## 2021-01-25 DIAGNOSIS — Z823 Family history of stroke: Secondary | ICD-10-CM

## 2021-01-25 DIAGNOSIS — E663 Overweight: Secondary | ICD-10-CM | POA: Diagnosis not present

## 2021-01-25 DIAGNOSIS — R519 Headache, unspecified: Secondary | ICD-10-CM | POA: Diagnosis not present

## 2021-01-25 DIAGNOSIS — G2581 Restless legs syndrome: Secondary | ICD-10-CM | POA: Diagnosis present

## 2021-01-25 DIAGNOSIS — Z96643 Presence of artificial hip joint, bilateral: Secondary | ICD-10-CM | POA: Diagnosis present

## 2021-01-25 DIAGNOSIS — Z8701 Personal history of pneumonia (recurrent): Secondary | ICD-10-CM | POA: Diagnosis not present

## 2021-01-25 DIAGNOSIS — Z7982 Long term (current) use of aspirin: Secondary | ICD-10-CM

## 2021-01-25 DIAGNOSIS — Z20822 Contact with and (suspected) exposure to covid-19: Secondary | ICD-10-CM | POA: Diagnosis not present

## 2021-01-25 DIAGNOSIS — Z86711 Personal history of pulmonary embolism: Secondary | ICD-10-CM | POA: Diagnosis not present

## 2021-01-25 DIAGNOSIS — M272 Inflammatory conditions of jaws: Secondary | ICD-10-CM | POA: Diagnosis not present

## 2021-01-25 DIAGNOSIS — M19011 Primary osteoarthritis, right shoulder: Secondary | ICD-10-CM | POA: Diagnosis present

## 2021-01-25 DIAGNOSIS — Z8042 Family history of malignant neoplasm of prostate: Secondary | ICD-10-CM

## 2021-01-25 DIAGNOSIS — Z6836 Body mass index (BMI) 36.0-36.9, adult: Secondary | ICD-10-CM

## 2021-01-25 DIAGNOSIS — Z882 Allergy status to sulfonamides status: Secondary | ICD-10-CM | POA: Diagnosis not present

## 2021-01-25 DIAGNOSIS — Z79899 Other long term (current) drug therapy: Secondary | ICD-10-CM | POA: Diagnosis not present

## 2021-01-25 DIAGNOSIS — Z888 Allergy status to other drugs, medicaments and biological substances status: Secondary | ICD-10-CM

## 2021-01-25 LAB — CBC
HCT: 40.8 % (ref 36.0–46.0)
Hemoglobin: 13.4 g/dL (ref 12.0–15.0)
MCH: 29.4 pg (ref 26.0–34.0)
MCHC: 32.8 g/dL (ref 30.0–36.0)
MCV: 89.5 fL (ref 80.0–100.0)
Platelets: 278 10*3/uL (ref 150–400)
RBC: 4.56 MIL/uL (ref 3.87–5.11)
RDW: 14.6 % (ref 11.5–15.5)
WBC: 9.4 10*3/uL (ref 4.0–10.5)
nRBC: 0 % (ref 0.0–0.2)

## 2021-01-25 LAB — BASIC METABOLIC PANEL
Anion gap: 9 (ref 5–15)
BUN: 16 mg/dL (ref 8–23)
CO2: 26 mmol/L (ref 22–32)
Calcium: 9.3 mg/dL (ref 8.9–10.3)
Chloride: 103 mmol/L (ref 98–111)
Creatinine, Ser: 0.74 mg/dL (ref 0.44–1.00)
GFR, Estimated: >60 mL/min (ref >60)
Glucose, Bld: 126 mg/dL — ABNORMAL HIGH (ref 70–99)
Potassium: 3.5 mmol/L (ref 3.5–5.1)
Sodium: 138 mmol/L (ref 135–145)

## 2021-01-25 LAB — RESP PANEL BY RT-PCR (FLU A&B, COVID) ARPGX2
Influenza A by PCR: NEGATIVE
Influenza B by PCR: NEGATIVE
SARS Coronavirus 2 by RT PCR: NEGATIVE

## 2021-01-25 LAB — LACTIC ACID, PLASMA: Lactic Acid, Venous: 1.6 mmol/L (ref 0.5–1.9)

## 2021-01-25 MED ORDER — MORPHINE SULFATE (PF) 4 MG/ML IV SOLN
4.0000 mg | Freq: Once | INTRAVENOUS | Status: AC
  Administered 2021-01-25: 4 mg via INTRAVENOUS
  Filled 2021-01-25: qty 1

## 2021-01-25 MED ORDER — DIPHENHYDRAMINE HCL 50 MG/ML IJ SOLN
INTRAMUSCULAR | Status: AC
  Filled 2021-01-25: qty 1

## 2021-01-25 MED ORDER — ACETAMINOPHEN 650 MG RE SUPP: 650.0000 mg | Freq: Four times a day (QID) | RECTAL | Status: DC | PRN

## 2021-01-25 MED ORDER — METRONIDAZOLE 500 MG/100ML IV SOLN
500.0000 mg | Freq: Two times a day (BID) | INTRAVENOUS | Status: DC
  Administered 2021-01-26 – 2021-01-27 (×3): 500 mg via INTRAVENOUS
  Filled 2021-01-25 (×4): qty 100

## 2021-01-25 MED ORDER — ONDANSETRON HCL 4 MG/2ML IJ SOLN: 4.0000 mg | Freq: Four times a day (QID) | INTRAMUSCULAR | Status: DC | PRN

## 2021-01-25 MED ORDER — IOHEXOL 300 MG/ML  SOLN
80.0000 mL | Freq: Once | INTRAMUSCULAR | Status: AC | PRN
  Administered 2021-01-25: 80 mL via INTRAVENOUS

## 2021-01-25 MED ORDER — MORPHINE SULFATE (PF) 2 MG/ML IV SOLN
2.0000 mg | INTRAVENOUS | Status: DC | PRN
  Administered 2021-01-26: 2 mg via INTRAVENOUS
  Filled 2021-01-25: qty 1

## 2021-01-25 MED ORDER — ACETAMINOPHEN 325 MG PO TABS: 650.0000 mg | ORAL_TABLET | Freq: Four times a day (QID) | ORAL | Status: DC | PRN

## 2021-01-25 MED ORDER — SODIUM CHLORIDE 0.9 % IV SOLN
1.0000 g | INTRAVENOUS | Status: DC
  Administered 2021-01-26: 1 g via INTRAVENOUS
  Filled 2021-01-25: qty 10
  Filled 2021-01-25: qty 1

## 2021-01-25 MED ORDER — DIPHENHYDRAMINE HCL 50 MG/ML IJ SOLN
25.0000 mg | Freq: Once | INTRAMUSCULAR | Status: AC
  Administered 2021-01-25: 25 mg via INTRAVENOUS

## 2021-01-25 MED ORDER — METRONIDAZOLE 500 MG/100ML IV SOLN
500.0000 mg | Freq: Once | INTRAVENOUS | Status: AC
  Administered 2021-01-25: 500 mg via INTRAVENOUS
  Filled 2021-01-25: qty 100

## 2021-01-25 MED ORDER — HYDROCODONE-ACETAMINOPHEN 5-325 MG PO TABS: 1.0000 | ORAL_TABLET | ORAL | Status: DC | PRN

## 2021-01-25 MED ORDER — SODIUM CHLORIDE 0.9 % IV BOLUS
1000.0000 mL | Freq: Once | INTRAVENOUS | Status: AC
  Administered 2021-01-25: 1000 mL via INTRAVENOUS

## 2021-01-25 MED ORDER — ONDANSETRON HCL 4 MG PO TABS: 4.0000 mg | ORAL_TABLET | Freq: Four times a day (QID) | ORAL | Status: DC | PRN

## 2021-01-25 MED ORDER — ENOXAPARIN SODIUM 60 MG/0.6ML IJ SOSY
0.5000 mg/kg | PREFILLED_SYRINGE | INTRAMUSCULAR | Status: DC
  Administered 2021-01-26 – 2021-01-27 (×2): 50 mg via SUBCUTANEOUS
  Filled 2021-01-25 (×2): qty 0.6

## 2021-01-25 MED ORDER — CEFTRIAXONE SODIUM 1 G IJ SOLR
1.0000 g | Freq: Once | INTRAMUSCULAR | Status: AC
  Administered 2021-01-25: 1 g via INTRAVENOUS
  Filled 2021-01-25: qty 10

## 2021-01-25 MED ORDER — DEXAMETHASONE SODIUM PHOSPHATE 10 MG/ML IJ SOLN
10.0000 mg | Freq: Once | INTRAMUSCULAR | Status: AC
Start: 2021-01-25 — End: 2021-01-25
  Administered 2021-01-25: 10 mg via INTRAVENOUS
  Filled 2021-01-25: qty 1

## 2021-01-25 NOTE — ED Provider Notes (Signed)
[facility] Provider Note    Event Date/Time   First MD Initiated Contact with Patient 01/25/21 1752     (approximate)   History   Neck Pain   HPI  Savannah Wilkins is a 71 y.o. female with past medical history of DVT, PE who presents with facial swelling and pain.  Patient tells me that she saw Dr. Pryor Ochoa 3 days ago and she was found to have a submandibular salivary stone that was removed and there was also purulence noted.  She has been on p.o. clindamycin but she is not compliant with.  Has not really had much improvement.  Saw him today in the office and she tells me that he told her to come to the emergency department for IV antibiotics.  Patient denies fever.  Denies increasing swelling but pain is just not getting better.  Has had this before says usually goes down easily.  Has had difficulty eating secondary to the pain.    Past Medical History:  Diagnosis Date   Arthritis    DVT (deep venous thrombosis) ([place]) 2015   pulmonary emboli   Headache    history of ileus 09/2020   History of kidney stones    Lichen sclerosus    Pneumonia 2015   PONV (postoperative nausea and vomiting)    Pulmonary emboli (HCC) 2015   Restless leg syndrome     Patient Active Problem List   Diagnosis Date Noted   Infectious sialoadenitis of right submandibular gland 01/25/2021   Submandibular sialolithiasis 01/25/2021   Status post total hip replacement, left 08/12/2020   S/P hip replacement 04/08/2020   Restless legs 07/02/2018   Postmenopausal bleeding 07/02/2018   Lesion of vulva 07/02/2018   Nephrolithiasis 07/02/2018   Cataract 07/02/2018   Medicare annual wellness visit, initial 11/09/2017   Headache disorder 08/26/2017   Obesity (BMI 35.0-39.9 without comorbidity) 37/10/6267   Lichen sclerosus 48/54/6270   History of pulmonary embolus (PE) 07/09/2017   Chronic daily headache 07/09/2017   DVT of leg (deep venous thrombosis) ([place]) 08/17/2013   Hypoxia  08/16/2013   Pleuritic chest pain 08/15/2013   Acute pulmonary embolism ([place]) 08/15/2013   Arthropathy of pelvic region and thigh 07/03/2012   Arthritis, hip 07/03/2012     Physical Exam  Triage Vital Signs: ED Triage Vitals [01/25/21 1454]  Enc Vitals Group     BP 131/88     Pulse Rate 100     Resp 18     Temp (!) 97.4 F (36.3 C)     Temp Source Oral     SpO2 97 %     Weight 216 lb (98 kg)     Height 5\' 5"  (1.651 m)     Head Circumference      Peak Flow      Pain Score 7     Pain Loc      Pain Edu?      Excl. in [place]?     Most recent vital signs: Vitals:   01/25/21 1822 01/25/21 1930  BP: (!) 147/73 (!) 159/74  Pulse: 77 70  Resp: 18   Temp:    SpO2: 99% 97%     General: Awake, no distress.  CV:  Good peripheral perfusion.  Resp:  Normal effort.  Abd:  No distention.  Neuro:             Awake, Alert, Oriented x 3  Other:  Mild submandibular swelling with tenderness to palpation, no obvious  intraoral swelling, mild trismus, wounds from the dog noted   ED Results / Procedures / Treatments  Labs (all labs ordered are listed, but only abnormal results are displayed) Labs Reviewed  BASIC METABOLIC PANEL - Abnormal; Notable for the following components:      Result Value   Glucose, Bld 126 (*)    All other components within normal limits  RESP PANEL BY RT-PCR (FLU A&B, COVID) ARPGX2  CBC  LACTIC ACID, PLASMA  CBC  CREATININE, SERUM     EKG     RADIOLOGY  Reviewed the CT of the neck which shows submandibular swelling consistent with sialoadenitis, agree with radiology report  PROCEDURES:  Critical Care performed: No  Procedures  The patient is on the cardiac monitor to evaluate for evidence of arrhythmia and/or significant heart rate changes.   MEDICATIONS ORDERED IN ED: Medications  cefTRIAXone (ROCEPHIN) 1 g in sodium chloride 0.9 % 100 mL IVPB (has no administration in time range)  metroNIDAZOLE (FLAGYL) IVPB 500 mg (has no  administration in time range)  enoxaparin (LOVENOX) injection 50 mg (has no administration in time range)  acetaminophen (TYLENOL) tablet 650 mg (has no administration in time range)    Or  acetaminophen (TYLENOL) suppository 650 mg (has no administration in time range)  HYDROcodone-acetaminophen (NORCO/VICODIN) 5-325 MG per tablet 1-2 tablet (has no administration in time range)  morphine 2 MG/ML injection 2 mg (has no administration in time range)  ondansetron (ZOFRAN) tablet 4 mg (has no administration in time range)    Or  ondansetron (ZOFRAN) injection 4 mg (has no administration in time range)  sodium chloride 0.9 % bolus 1,000 mL (0 mLs Intravenous Stopped 01/25/21 2036)  cefTRIAXone (ROCEPHIN) 1 g in sodium chloride 0.9 % 100 mL IVPB (0 g Intravenous Stopped 01/25/21 1924)  metroNIDAZOLE (FLAGYL) IVPB 500 mg (0 mg Intravenous Stopped 01/25/21 2036)  dexamethasone (DECADRON) injection 10 mg (10 mg Intravenous Given 01/25/21 1847)  diphenhydrAMINE (BENADRYL) injection 25 mg (25 mg Intravenous Given 01/25/21 1852)  iohexol (OMNIPAQUE) 300 MG/ML solution 80 mL (80 mLs Intravenous Contrast Given 01/25/21 1953)  morphine 4 MG/ML injection 4 mg (4 mg Intravenous Given 01/25/21 2152)     IMPRESSION / MDM / ASSESSMENT AND PLAN / ED COURSE  I reviewed the triage vital signs and the nursing notes.                              Differential diagnosis includes, but is not limited to, sialoadenitis, recurrent salivary stone, submandibular abscess   Patient is a 71 year old female who recently had a salivary stone removed from the submandibular gland with expression of purulence and has been on p.o. clindamycin with minimal improvement apparently sent from ENT.  I discussed with Dr. Aundra Dubin who had discussed the case with Dr. Pryor Ochoa notes that the patient really did not have much improvement on the p.o. clinda although not getting much worse either.  Patient is noncompliant with salivary duct massage or  sialagogues.  Thought that she would likely need IV antibiotics and steroids.  Patient looks well to me she does have some submandibular swelling with tenderness, mild trismus.  We will get a CT scan of the neck and give her dose of IV ceftriaxone and Flagyl.  Patient has allergy to penicillin but has apparently tolerated cephalexin in the past and has no allergy to Flagyl.  Given she is ready been on clinda will switch.  We will  give her fluids and a dose of IV Decadron as well.  Will reassess need for admission after CT.  CT and straits right submandibular sialoadenitis with sialolithiasis.  On reassessment patient still in significant discomfort.  Will give her dose of morphine and admit for further IV antibiotics and pain control.  ENT planning to see in the morning.     FINAL CLINICAL IMPRESSION(S) / ED DIAGNOSES   Final diagnoses:  Sialoadenitis of submandibular gland     Rx / DC Orders   ED Discharge Orders     None        Note:  This document was prepared using Dragon voice recognition software and may include unintentional dictation errors.   Rada Hay, MD 01/25/21 [phone]

## 2021-01-25 NOTE — ED Notes (Signed)
Pt started to complain of burning in her genitals after decadron was giving, MD aware ordered dose of benadryl   Pt denies feeling short of breath or like her throat is closing at this time

## 2021-01-25 NOTE — H&P (Signed)
History and Physical    Savannah Wilkins GEX:[id number] DOB: 06/14/1950 DOA: 01/25/2021  PCP: Dion Body, MD   Patient coming from: home  I have personally briefly reviewed patient's relevant medical records in [place]  Chief Complaint: pain right neck after salivary gland procedure  HPI: Savannah Wilkins is a 71 y.o. 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  She denies fever or chills.  IV antibiotics were recommended by ENT.  ED course: Afebrile, borderline heart rate of 100 with otherwise normal vitals Blood work with WBC 9.4 and lactic acid 1.6.  BMP unremarkable  CT soft tissue neck with contrast: Right submandibular sialolithiasis and sialoadenitis  Patient started on ceftriaxone and metronidazole as well as dexamethasone and given morphine for pain.  Hospitalist consulted for admission.   Review of Systems: As per HPI otherwise all other systems on review of systems negative.   Assessment/Plan    Infectious sialoadenitis of right submandibular gland   Submandibular sialolithiasis -Patient with multiple antibiotic allergies - Continue IV Rocephin and Flagyl - Pain control and soft diet - Might benefit from warm compresses - ENT for further recommendations  Chronic headaches - On Emgality    History of pulmonary embolus (PE) - Previously completed 6 months of anticoagulation - No acute concerns at this time   DVT prophylaxis: Lovenox  Code Status: full code  Family Communication:  none  Disposition Plan: Back to previous home environment Consults called: ENT Status:At the time of admission,  it appears that the appropriate admission status for this patient is INPATIENT. This is judged to be reasonable and necessary in order to provide the required intensity of service to ensure the patient's safety given the presenting symptoms, physical exam findings, and initial radiographic and laboratory data in the context of their  Comorbid conditions.   Patient requires inpatient status due to high intensity of service, high risk for further deterioration and high frequency of surveillance required.   I certify that at the point of admission it is my clinical judgment that the patient will require inpatient hospital care spanning beyond 2 midnights     Physical Exam: Vitals:   01/25/21 1454 01/25/21 1822 01/25/21 1930  BP: 131/88 (!) 147/73 (!) 159/74  Pulse: 100 77 70  Resp: 18 18   Temp: (!) 97.4 F (36.3 C)    TempSrc: Oral    SpO2: 97% 99% 97%  Weight: 98 kg    Height: 5\' 5"  (1.651 m)     Constitutional: Alert, oriented x 3 . Not in any apparent distress HEENT:      Head: Normocephalic and atraumatic.         Eyes: PERLA, EOMI, Conjunctivae are normal. Sclera is non-icteric.       Mouth/Throat: Mild trismus      Neck: Pain on palpation submandibular area on right Cardiovascular: Regular rate and rhythm. No murmurs, gallops, or rubs. 2+ symmetrical distal pulses are present . No JVD. No  LE edema Respiratory: Respiratory effort normal .Lungs sounds clear bilaterally. No wheezes, crackles, or rhonchi.  Gastrointestinal: Soft, non tender, non distended. Positive bowel sounds.  Genitourinary: No CVA tenderness. Musculoskeletal: Nontender with normal range  of motion in all extremities. No cyanosis, or erythema of extremities. Neurologic:  Face is symmetric. Moving all extremities. No gross focal neurologic deficits . Skin: Skin is warm, dry.  No rash or ulcers Psychiatric: Mood and affect are appropriate     Past Medical History:  Diagnosis Date   Arthritis    DVT (deep  venous thrombosis) ([place]) 2015   pulmonary emboli   Headache    history of ileus 09/2020   History of kidney stones    Lichen sclerosus    Pneumonia 2015   PONV (postoperative nausea and vomiting)    Pulmonary emboli ([place]) 2015   Restless leg syndrome     Past Surgical History:  Procedure Laterality Date   CHOLECYSTECTOMY  1996   COLONOSCOPY  2015   CYST EXCISION Right 2010   pinkie finger   dcr Right    right eye   EXTRACORPOREAL SHOCK WAVE LITHOTRIPSY Bilateral 2006   twice on left and once on right   EXTRACORPOREAL SHOCK WAVE LITHOTRIPSY Right 07/25/2018   Procedure: EXTRACORPOREAL SHOCK WAVE LITHOTRIPSY (ESWL);  Surgeon: Billey Co, MD;  Location: [facility];  Service: Urology;  Laterality: Right;   EYE SURGERY Bilateral 2013   cataract extractions   HYSTEROSCOPY  2015   IVC FILTER INSERTION N/A 04/06/2020   Procedure: IVC FILTER INSERTION;  Surgeon: Katha Cabal, MD;  Location: [facility];  Service: Cardiovascular;  Laterality: N/A;   IVC FILTER REMOVAL N/A 10/26/2020   Procedure: IVC FILTER REMOVAL;  Surgeon: Katha Cabal, MD;  Location: [facility];  Service: Cardiovascular;  Laterality: N/A;   IVC FILTER REMOVAL N/A 11/24/2020   Procedure: IVC FILTER REMOVAL;  Surgeon: Katha Cabal, MD;  Location: [facility];  Service: Cardiovascular;  Laterality: N/A;   kidney stone removal Right 2006, 2013   right ureteroscopic stone removals   RETINAL TEAR REPAIR CRYOTHERAPY Bilateral    RIGHT 2007 AND 2008; LEFT 2012   TONSILLECTOMY  1984   TOTAL HIP ARTHROPLASTY Right 04/08/2020   Procedure: TOTAL HIP ARTHROPLASTY ANTERIOR APPROACH;  Surgeon: Hessie Knows, MD;  Location: [facility];  Service: Orthopedics;  Laterality: Right;   TOTAL HIP ARTHROPLASTY Left 08/12/2020   Procedure: TOTAL HIP ARTHROPLASTY ANTERIOR APPROACH;  Surgeon: Hessie Knows, MD;  Location: [facility];  Service: Orthopedics;  Laterality: Left;   TUBAL LIGATION  1985      reports that she has never smoked. She has never used smokeless tobacco. She reports that she does not currently use alcohol. She reports that she does not currently use drugs.  Allergies  Allergen Reactions   Levaquin [Levofloxacin]    Ciprofloxacin Other (See Comments)    tendonitis   Penicillins Rash    Tolerated 2g Ancef   Quinolones Other (See Comments)    Caused pain & cramping Caused severe tendonitis in feet   Sulfa Antibiotics Rash    Family History  Problem Relation Age of Onset   Stroke Mother    Stroke Father    AAA (abdominal aortic aneurysm) Father    Prostate cancer Father    Hypertension Father    Breast cancer Neg Hx       Prior to Admission medications   Medication Sig Start Date End Date Taking? Authorizing Provider  aspirin EC 81 MG tablet Take 81 mg by mouth every evening. Swallow whole.    [provider]  Carboxymethylcellul-Glycerin (REFRESH OPTIVE OP) Place 1 drop into both eyes 3 (three)  times daily as needed (dry eyes).    [provider]  clobetasol ointment (TEMOVATE) 4.94 % Apply 1 application topically 2 (two) times a week. lichen sclerosus (Mondays & Fridays) 03/19/18   [provider]  EMGALITY 120 MG/ML SOAJ Inject 120 mg as directed every 30 (thirty) days. 06/12/18   [provider]  Multiple Vitamins-Minerals (PRESERVISION AREDS 2 PO) Take 1 tablet by mouth in the morning and at bedtime.    [provider]  ondansetron (ZOFRAN) 4 MG tablet Take 1 tablet (4 mg total) by mouth every 8 (eight) hours as needed for vomiting or nausea. Patient not taking: Reported on 11/18/2020 09/19/20   Nance Pear, MD      Labs on Admission: I have personally reviewed following labs and imaging studies  CBC: Recent Labs  Lab 01/25/21 1502  WBC 9.4  HGB 13.4  HCT 40.8  MCV 89.5  PLT 496   Basic Metabolic Panel: Recent Labs  Lab 01/25/21 1502  NA 138  K 3.5  CL 103  CO2 26  GLUCOSE 126*  BUN  16  CREATININE 0.74  CALCIUM 9.3   GFR: Estimated Creatinine Clearance: 74.7 mL/min (by C-G formula based on SCr of 0.74 mg/dL). Liver Function Tests: No results for input(s): AST, ALT, ALKPHOS, BILITOT, PROT, ALBUMIN in the last 168 hours. No results for input(s): LIPASE, AMYLASE in the last 168 hours. No results for input(s): AMMONIA in the last 168 hours. Coagulation Profile: No results for input(s): INR, PROTIME in the last 168 hours. Cardiac Enzymes: No results for input(s): CKTOTAL, CKMB, CKMBINDEX, TROPONINI in the last 168 hours. BNP (last 3 results) No results for input(s): PROBNP in the last 8760 hours. HbA1C: No results for input(s): HGBA1C in the last 72 hours. CBG: No results for input(s): GLUCAP in the last 168 hours. Lipid Profile: No results for input(s): CHOL, HDL, LDLCALC, TRIG, CHOLHDL, LDLDIRECT in the last 72 hours. Thyroid Function Tests: No results for input(s): TSH, T4TOTAL, FREET4, T3FREE, THYROIDAB in the last 72 hours. Anemia Panel: No results for input(s): VITAMINB12, FOLATE, FERRITIN, TIBC, IRON, RETICCTPCT in the last 72 hours. Urine analysis:    Component Value Date/Time   COLORURINE YELLOW (A) 08/03/2020 1113   APPEARANCEUR HAZY (A) 08/03/2020 1113   APPEARANCEUR Cloudy (A) 07/22/2018 1253   LABSPEC 1.025 08/03/2020 1113   PHURINE 5.0 08/03/2020 1113   GLUCOSEU NEGATIVE 08/03/2020 1113   HGBUR NEGATIVE 08/03/2020 1113   BILIRUBINUR NEGATIVE 08/03/2020 1113   BILIRUBINUR Negative 07/22/2018 1253   KETONESUR 5 (A) 08/03/2020 1113   PROTEINUR NEGATIVE 08/03/2020 1113   NITRITE NEGATIVE 08/03/2020 1113   LEUKOCYTESUR NEGATIVE 08/03/2020 1113    Radiological Exams on Admission: CT Soft Tissue Neck W Contrast  Result Date: 01/25/2021 CLINICAL DATA:  Soft tissue swelling.  Right neck pain. EXAM: CT NECK WITH CONTRAST TECHNIQUE: Multidetector CT imaging of the neck was performed using the standard protocol following the bolus administration of  intravenous contrast. RADIATION DOSE REDUCTION: This exam was performed according to the departmental dose-optimization program which includes automated exposure control, adjustment of the mA and/or kV according to patient size and/or use of iterative reconstruction technique. CONTRAST:  85mL OMNIPAQUE IOHEXOL 300 MG/ML  SOLN COMPARISON:  None. FINDINGS: Pharynx and larynx: No evidence of mass or swelling. Widely patent airway. No retropharyngeal fluid. Salivary glands: Multiple stones in the right submandibular gland individually measure up to 8 mm in size. The right submandibular gland is asymmetrically enlarged with mild-to-moderate surrounding inflammation. No submandibular  ductal dilatation or ductal stones are identified although dental streak artifact limits assessment in the anterior aspect of the oral cavity. The left submandibular gland and both parotid glands are unremarkable. Thyroid: Unremarkable. Lymph nodes: Small but asymmetrically enlarged right submandibular lymph nodes which are all subcentimeter in short axis, likely reactive. Vascular: Major vascular structures of the neck are grossly patent. Limited intracranial: Unremarkable. Visualized orbits: Bilateral cataract extraction. Mastoids and visualized paranasal sinuses: Clear. Skeleton: No acute osseous abnormality or suspicious osseous lesion. Moderate cervical disc degeneration. Degenerative changes at the right shoulder with evidence of a chronic AC joint injury. Upper chest: Clear lung apices. Other: None. IMPRESSION: Right submandibular sialolithiasis and sialoadenitis. Electronically Signed   By: Logan Bores M.D.   On: 01/25/2021 21:02       Athena Masse MD [facility]   01/25/2021, 10:10 PM

## 2021-01-25 NOTE — Progress Notes (Signed)
PHARMACIST - PHYSICIAN COMMUNICATION  CONCERNING:  Enoxaparin (Lovenox) for DVT Prophylaxis    RECOMMENDATION: Patient was prescribed enoxaprin 40mg  q24 hours for VTE prophylaxis.   Filed Weights   01/25/21 1454  Weight: 98 kg (216 lb)    Body mass index is 35.94 kg/m.  Estimated Creatinine Clearance: 74.7 mL/min (by C-G formula based on SCr of 0.74 mg/dL).   Based on [place] patient is candidate for enoxaparin 0.5mg /kg TBW SQ every 24 hours based on BMI being >30.  DESCRIPTION: Pharmacy has adjusted enoxaparin dose per [facility] policy.  Patient is now receiving enoxaparin 0.5 mg/kg every 24 hours   Renda Rolls, PharmD, [facility] 01/25/2021 10:37 PM

## 2021-01-25 NOTE — ED Triage Notes (Signed)
Pt here with right neck pain. Pt is seeing a provider who recently removed pus and stones from her neck on Friday but her infection is not improving, Pt was sent here for IV abx. Pt in NAD in triage. Pt is on clindamycin.

## 2021-01-26 ENCOUNTER — Encounter: Payer: Self-pay | Admitting: Internal Medicine

## 2021-01-26 DIAGNOSIS — G8929 Other chronic pain: Secondary | ICD-10-CM | POA: Diagnosis not present

## 2021-01-26 DIAGNOSIS — K1120 Sialoadenitis, unspecified: Secondary | ICD-10-CM | POA: Diagnosis not present

## 2021-01-26 DIAGNOSIS — E663 Overweight: Secondary | ICD-10-CM | POA: Diagnosis not present

## 2021-01-26 DIAGNOSIS — R519 Headache, unspecified: Secondary | ICD-10-CM

## 2021-01-26 MED ORDER — METHYLPREDNISOLONE SODIUM SUCC 40 MG IJ SOLR
40.0000 mg | INTRAMUSCULAR | Status: DC
  Administered 2021-01-26 – 2021-01-27 (×2): 40 mg via INTRAVENOUS
  Filled 2021-01-26 (×2): qty 1

## 2021-01-26 NOTE — Progress Notes (Signed)
PROGRESS NOTE    Savannah Wilkins  YQM:[id number] DOB: December 27, 1950 DOA: 01/25/2021 PCP: Dion Body, MD   Assessment & Plan:   Principal Problem:   Infectious sialoadenitis of right submandibular gland Active Problems:   History of pulmonary embolus (PE)   Submandibular sialolithiasis   Sialoadenitis of right submandibular gland: continue on IV rocephin, flagyl & IV solumedrol. Norco, morphine prn. ENT following and recs apprec   Chronic headaches: on emgality q30days   Hx of pulmonary embolus: previously completed 6 months of anticoagulation   Obesity: BMI 36.3. Would benefit from weight loss   DVT prophylaxis: lovenox  Code Status:  full  Family Communication:  Disposition Plan: likely d/c home tomorrow  Level of care: Med-Surg  Status is: Inpatient  Remains inpatient appropriate because: still requiring IV abxs       Consultants:  ENT, Dr. Pryor Ochoa   Procedures:   Antimicrobials: rocephin, flagyl    Subjective: Pt c/o submandibular pain   Objective: Vitals:   01/26/21 0132 01/26/21 0200 01/26/21 0435 01/26/21 0745  BP: (!) 163/92  (!) 153/91 (!) 144/85  Pulse: 83  62 63  Resp: 20  16 18   Temp: (!) 97.4 F (36.3 C)  (!) 97.5 F (36.4 C) 97.7 F (36.5 C)  TempSrc: Oral  Oral Oral  SpO2: 95%  95% 96%  Weight:  99.2 kg    Height:  5\' 5"  (1.651 m)      Intake/Output Summary (Last 24 hours) at 01/26/2021 1231 Last data filed at 01/26/2021 1031 Gross per 24 hour  Intake 1380 ml  Output --  Net 1380 ml   Filed Weights   01/25/21 1454 01/26/21 0200  Weight: 98 kg 99.2 kg    Examination:  General exam: Appears calm but uncomfortable  Respiratory system: Clear to auscultation. Respiratory effort normal. Cardiovascular system: S1 & S2 +. No \ rubs, gallops or clicks. Gastrointestinal system: Abdomen is obese, soft and nontender. Normal bowel sounds heard. Central nervous system: Alert and oriented. Moves all extremities  Psychiatry: Judgement  and insight appear normal. Mood & affect appropriate.     Data Reviewed: I have personally reviewed following labs and imaging studies  CBC: Recent Labs  Lab 01/25/21 1502  WBC 9.4  HGB 13.4  HCT 40.8  MCV 89.5  PLT 528   Basic Metabolic Panel: Recent Labs  Lab 01/25/21 1502  NA 138  K 3.5  CL 103  CO2 26  GLUCOSE 126*  BUN 16  CREATININE 0.74  CALCIUM 9.3   GFR: Estimated Creatinine Clearance: 75.2 mL/min (by C-G formula based on SCr of 0.74 mg/dL). Liver Function Tests: No results for input(s): AST, ALT, ALKPHOS, BILITOT, PROT, ALBUMIN in the last 168 hours. No results for input(s): LIPASE, AMYLASE in the last 168 hours. No results for input(s): AMMONIA in the last 168 hours. Coagulation Profile: No results for input(s): INR, PROTIME in the last 168 hours. Cardiac Enzymes: No results for input(s): CKTOTAL, CKMB, CKMBINDEX, TROPONINI in the last 168 hours. BNP (last 3 results) No results for input(s): PROBNP in the last 8760 hours. HbA1C: No results for input(s): HGBA1C in the last 72 hours. CBG: No results for input(s): GLUCAP in the last 168 hours. Lipid Profile: No results for input(s): CHOL, HDL, LDLCALC, TRIG, CHOLHDL, LDLDIRECT in the last 72 hours. Thyroid Function Tests: No results for input(s): TSH, T4TOTAL, FREET4, T3FREE, THYROIDAB in the last 72 hours. Anemia Panel: No results for input(s): VITAMINB12, FOLATE, FERRITIN, TIBC, IRON, RETICCTPCT in the last 72  hours. Sepsis Labs: Recent Labs  Lab 01/25/21 1822  LATICACIDVEN 1.6    Recent Results (from the past 240 hour(s))  Resp Panel by RT-PCR (Flu A&B, Covid) Nasopharyngeal Swab     Status: None   Collection Time: 01/25/21  9:53 PM   Specimen: Nasopharyngeal Swab; Nasopharyngeal(NP) swabs in vial transport medium  Result Value Ref Range Status   SARS Coronavirus 2 by RT PCR NEGATIVE NEGATIVE Final    Comment: (NOTE) SARS-CoV-2 target nucleic acids are NOT DETECTED.  The SARS-CoV-2 RNA is  generally detectable in upper respiratory specimens during the acute phase of infection. The lowest concentration of SARS-CoV-2 viral copies this assay can detect is 138 copies/mL. A negative result does not preclude SARS-Cov-2 infection and should not be used as the sole basis for treatment or other patient management decisions. A negative result may occur with  improper specimen collection/handling, submission of specimen other than nasopharyngeal swab, presence of viral mutation(s) within the areas targeted by this assay, and inadequate number of viral copies(<138 copies/mL). A negative result must be combined with clinical observations, patient history, and epidemiological information. The expected result is Negative.  Fact Sheet for Patients:  EntrepreneurPulse.com.au  Fact Sheet for Healthcare Providers:  IncredibleEmployment.be  This test is no t yet approved or cleared by the [place] FDA and  has been authorized for detection and/or diagnosis of SARS-CoV-2 by FDA under an Emergency Use Authorization (EUA). This EUA will remain  in effect (meaning this test can be used) for the duration of the COVID-19 declaration under Section 564(b)(1) of the Act, 21 U.S.C.section 360bbb-3(b)(1), unless the authorization is terminated  or revoked sooner.       Influenza A by PCR NEGATIVE NEGATIVE Final   Influenza B by PCR NEGATIVE NEGATIVE Final    Comment: (NOTE) The Xpert Xpress SARS-CoV-2/FLU/RSV plus assay is intended as an aid in the diagnosis of influenza from Nasopharyngeal swab specimens and should not be used as a sole basis for treatment. Nasal washings and aspirates are unacceptable for Xpert Xpress SARS-CoV-2/FLU/RSV testing.  Fact Sheet for Patients: EntrepreneurPulse.com.au  Fact Sheet for Healthcare Providers: IncredibleEmployment.be  This test is not yet approved or cleared by the [place] FDA and has been authorized for detection and/or diagnosis of SARS-CoV-2 by FDA under an Emergency Use Authorization (EUA). This EUA will remain in effect (meaning this test can be used) for the duration of the COVID-19 declaration under Section 564(b)(1) of the Act, 21 U.S.C. section 360bbb-3(b)(1), unless the authorization is terminated or revoked.  Performed at [facility], [address], [place], [place] [address]          Radiology Studies: CT Soft Tissue Neck W Contrast  Result Date: 01/25/2021 CLINICAL DATA:  Soft tissue swelling.  Right neck pain. EXAM: CT NECK WITH CONTRAST TECHNIQUE: Multidetector CT imaging of the neck was performed using the standard protocol following the bolus administration of intravenous contrast. RADIATION DOSE REDUCTION: This exam was performed according to the departmental dose-optimization program which includes automated exposure control, adjustment of the mA and/or kV according to patient size and/or use of iterative reconstruction technique. CONTRAST:  58mL OMNIPAQUE IOHEXOL 300 MG/ML  SOLN COMPARISON:  None. FINDINGS: Pharynx and larynx: No evidence of mass or swelling. Widely patent airway. No retropharyngeal fluid. Salivary glands: Multiple stones in the right submandibular gland individually measure up to 8 mm in size. The right submandibular gland is asymmetrically enlarged with mild-to-moderate surrounding inflammation. No submandibular ductal dilatation or ductal stones  are identified although dental streak artifact limits assessment in the anterior aspect of the oral cavity. The left submandibular gland and both parotid glands are unremarkable. Thyroid: Unremarkable. Lymph nodes: Small but asymmetrically enlarged right submandibular lymph nodes which are all subcentimeter in short axis, likely reactive. Vascular: Major vascular structures of the neck are grossly patent. Limited intracranial: Unremarkable. Visualized orbits:  Bilateral cataract extraction. Mastoids and visualized paranasal sinuses: Clear. Skeleton: No acute osseous abnormality or suspicious osseous lesion. Moderate cervical disc degeneration. Degenerative changes at the right shoulder with evidence of a chronic AC joint injury. Upper chest: Clear lung apices. Other: None. IMPRESSION: Right submandibular sialolithiasis and sialoadenitis. Electronically Signed   By: Logan Bores M.D.   On: 01/25/2021 21:02        Scheduled Meds:  enoxaparin (LOVENOX) injection  0.5 mg/kg Subcutaneous Q24H   methylPREDNISolone (SOLU-MEDROL) injection  40 mg Intravenous Q24H   Continuous Infusions:  cefTRIAXone (ROCEPHIN)  IV     metronidazole 500 mg (01/26/21 0819)     LOS: 1 day    Time spent: 30 mins     Wyvonnia Dusky, MD [facility] Pager [phone]  If 7PM-7AM, please contact night-coverage 01/26/2021, 12:31 PM

## 2021-01-26 NOTE — Progress Notes (Signed)
..  01/26/2021 5:04 PM  Catalina Gravel [id number]   Temp:  [97.4 F (36.3 C)-98.2 F (36.8 C)] 98.2 F (36.8 C) (01/11 1623) Pulse Rate:  [62-83] 62 (01/11 1623) Resp:  [16-20] 18 (01/11 1623) BP: (128-163)/(73-92) 147/75 (01/11 1623) SpO2:  [93 %-99 %] 95 % (01/11 1623) Weight:  [99.2 kg] 99.2 kg (01/11 0200),     Intake/Output Summary (Last 24 hours) at 01/26/2021 1704 Last data filed at 01/26/2021 1600 Gross per 24 hour  Intake 1717.21 ml  Output --  Net 1717.21 ml    Results for orders placed or performed during the hospital encounter of 01/25/21 (from the past 24 hour(s))  Lactic acid, plasma     Status: None   Collection Time: 01/25/21  6:22 PM  Result Value Ref Range   Lactic Acid, Venous 1.6 0.5 - 1.9 mmol/L  Resp Panel by RT-PCR (Flu A&B, Covid) Nasopharyngeal Swab     Status: None   Collection Time: 01/25/21  9:53 PM   Specimen: Nasopharyngeal Swab; Nasopharyngeal(NP) swabs in vial transport medium  Result Value Ref Range   SARS Coronavirus 2 by RT PCR NEGATIVE NEGATIVE   Influenza A by PCR NEGATIVE NEGATIVE   Influenza B by PCR NEGATIVE NEGATIVE    SUBJECTIVE:  Significant improvement overnight.  Continues to report pain with opening of mouth but improved.  Able to tolerate some PO.  Continues to require pain medication  OBJECTIVE:  GEN- NAD supine in bed OC/OP- erythema and edema of floor of mouth with milky secretions from submandibular gland.  Soft floor of mouth.  Trismus but improved NECK- Tenderness and inflammation and induration of right submandibular gland.  IMPRESSION:  Sialolithiasis and sialoadenitis of submandibular gland  PLAN:  Improved overnight but failed outpatient Clindamycin and Depomedrol.  Recommend continuation of Rocephin/Flagyl overnight and steroids.  CT scan showed multiple stones in gland and patient most likely will need surgical removal in future.  Would like to wait at least 4 to 6 weeks prior to proceeding with surgery if possible  to help with inflammation.  Consider oral cephalosporin given improvement with Rocephin yet failing Clindamycin.  Will continue to follow while in hospital.  Carloyn Manner 01/26/2021, 5:04 PM

## 2021-01-26 NOTE — ED Notes (Signed)
Pt is asleep at this time.

## 2021-01-26 NOTE — Plan of Care (Signed)
°  Problem: Education: Goal: Knowledge of General Education information will improve Description: Including pain rating scale, medication(s)/side effects and non-pharmacologic comfort measures Outcome: Progressing   Problem: Clinical Measurements: Goal: Ability to maintain clinical measurements within normal limits will improve Outcome: Progressing Goal: Diagnostic test results will improve Outcome: Progressing Goal: Respiratory complications will improve Outcome: Progressing Goal: Cardiovascular complication will be avoided Outcome: Progressing

## 2021-01-26 NOTE — Clinical Social Work Note (Signed)
°  Transition of Care (TOC) Screening Note   Patient Details  Name: Oveda Dadamo Date of Birth: 06/03/1950   Transition of Care [facility]) CM/SW Contact:    Eileen Stanford, LCSW Phone [place] 01/26/2021, 12:48 PM    Transition of Care Department [facility]) has reviewed patient and no TOC needs have been identified at this time. We will continue to monitor patient advancement through interdisciplinary progression rounds. If new patient transition needs arise, please place a TOC consult.

## 2021-01-27 DIAGNOSIS — R519 Headache, unspecified: Secondary | ICD-10-CM | POA: Diagnosis not present

## 2021-01-27 DIAGNOSIS — E663 Overweight: Secondary | ICD-10-CM | POA: Diagnosis not present

## 2021-01-27 DIAGNOSIS — K115 Sialolithiasis: Secondary | ICD-10-CM | POA: Diagnosis not present

## 2021-01-27 DIAGNOSIS — K1120 Sialoadenitis, unspecified: Secondary | ICD-10-CM | POA: Diagnosis not present

## 2021-01-27 DIAGNOSIS — G8929 Other chronic pain: Secondary | ICD-10-CM | POA: Diagnosis not present

## 2021-01-27 LAB — BASIC METABOLIC PANEL
Anion gap: 7 (ref 5–15)
BUN: 13 mg/dL (ref 8–23)
CO2: 25 mmol/L (ref 22–32)
Calcium: 9 mg/dL (ref 8.9–10.3)
Chloride: 110 mmol/L (ref 98–111)
Creatinine, Ser: 0.59 mg/dL (ref 0.44–1.00)
GFR, Estimated: >60 mL/min (ref >60)
Glucose, Bld: 135 mg/dL — ABNORMAL HIGH (ref 70–99)
Potassium: 3.5 mmol/L (ref 3.5–5.1)
Sodium: 142 mmol/L (ref 135–145)

## 2021-01-27 LAB — CBC
HCT: 37.6 % (ref 36.0–46.0)
Hemoglobin: 12.6 g/dL (ref 12.0–15.0)
MCH: 29.7 pg (ref 26.0–34.0)
MCHC: 33.5 g/dL (ref 30.0–36.0)
MCV: 88.7 fL (ref 80.0–100.0)
Platelets: 269 10*3/uL (ref 150–400)
RBC: 4.24 MIL/uL (ref 3.87–5.11)
RDW: 14.6 % (ref 11.5–15.5)
WBC: 8.9 10*3/uL (ref 4.0–10.5)
nRBC: 0 % (ref 0.0–0.2)

## 2021-01-27 MED ORDER — PREDNISONE 10 MG PO TABS: ORAL_TABLET | ORAL | 0 refills | Status: DC

## 2021-01-27 MED ORDER — CEFDINIR 300 MG PO CAPS: 300.0000 mg | ORAL_CAPSULE | Freq: Two times a day (BID) | ORAL | 0 refills | Status: AC

## 2021-01-27 NOTE — Discharge Summary (Signed)
Physician Discharge Summary  Savannah Wilkins OZD:[id number] DOB: 07/14/1950 DOA: 01/25/2021  PCP: Dion Body, MD  Admit date: 01/25/2021 Discharge date: 01/27/2021  Admitted From: home  Disposition:  home   Recommendations for Outpatient Follow-up:  Follow up with PCP in 1-2 weeks F/u w/ ENT, Dr. Pryor Ochoa, 01/28/21  Home Health: no  Equipment/Devices:  Discharge Condition: stable  CODE STATUS: full  Diet recommendation: regular   Brief/Interim Summary: HPI was taken from Savannah Wilkins: Savannah Wilkins is a 71 y.o. 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  She denies fever or chills.  IV antibiotics were recommended by ENT.   ED course: Afebrile, borderline heart rate of 100 with otherwise normal vitals Blood work with WBC 9.4 and lactic acid 1.6.  BMP unremarkable   CT soft tissue neck with contrast: Right submandibular sialolithiasis and sialoadenitis   Patient started on ceftriaxone and metronidazole as well as dexamethasone and given morphine for pain.  Hospitalist consulted for admission.   Hospital course from Dr. Jimmye Norman 1/11-1/12/23: Pt was found to have sialoadenitis of right submandibular gland and was treated w/ IV rocephin, flagyl and IV steroids. Pt was d/c home on po cefdinir and prednisone. Pt will f/u w/ ENT, Dr. Pryor Ochoa, tomorrow 01/28/21. For more information, please see previous progress/consult notes.   Discharge Diagnoses:  Principal Problem:   Infectious sialoadenitis of right submandibular gland Active Problems:   History of pulmonary embolus (PE)   Submandibular sialolithiasis  Sialoadenitis of  right submandibular gland: changed abxs to po cefdinir and po prednisone at d/c. Norco, morphine prn. ENT following and recs apprec   Chronic headaches: on emgality q30days   Hx of pulmonary embolus: previously completed 6 months of anticoagulation   Obesity: BMI 36.3. Would benefit from weight loss   Discharge Instructions  Discharge Instructions     Diet general   Complete by: As directed    Discharge instructions   Complete by: As directed    F/u w/ PCP in 1-2 weeks. F/u w/ ENT, Dr. Pryor Ochoa, 01/28/21   Increase activity slowly   Complete by: As directed       Allergies as of 01/27/2021       Reactions   Levaquin [levofloxacin]    Ciprofloxacin Other (See Comments)   tendonitis   Penicillins Rash   Tolerated 2g Ancef   Quinolones Other (See Comments)   Caused pain & cramping Caused severe tendonitis in feet   Sulfa Antibiotics Rash        Medication List     TAKE these medications    aspirin EC 81 MG tablet Take 81 mg by mouth every evening. Swallow whole.   cefdinir 300 MG capsule Commonly known as: OMNICEF Take 1 capsule (300 mg total) by mouth 2 (two) times daily for 6 days.   clobetasol ointment 0.05 % Commonly known as: TEMOVATE Apply 1 application topically 2 (two) times a week. lichen sclerosus (Mondays & Fridays)   Emgality 120 MG/ML Soaj Generic drug: Galcanezumab-gnlm Inject 120 mg as directed every 30 (thirty) days.   ondansetron 4 MG tablet Commonly known as: Zofran Take 1 tablet (4 mg total) by mouth every 8 (eight) hours as needed  for vomiting or nausea.   predniSONE 10 MG tablet Commonly known as: DELTASONE 40 mg daily x 2 days, 30mg  daily x 2 days, 20mg  daily x 2 days then stop   PRESERVISION AREDS 2 PO Take 1 tablet by mouth in the morning and at bedtime.   REFRESH OPTIVE OP Place 1 drop into both eyes 3 (three) times daily as needed (dry eyes).        Allergies  Allergen Reactions   Levaquin [Levofloxacin]     Ciprofloxacin Other (See Comments)    tendonitis   Penicillins Rash    Tolerated 2g Ancef   Quinolones Other (See Comments)    Caused pain & cramping Caused severe tendonitis in feet   Sulfa Antibiotics Rash    Consultations: ENT, Dr. Pryor Ochoa    Procedures/Studies: CT Soft Tissue Neck W Contrast  Result Date: 01/25/2021 CLINICAL DATA:  Soft tissue swelling.  Right neck pain. EXAM: CT NECK WITH CONTRAST TECHNIQUE: Multidetector CT imaging of the neck was performed using the standard protocol following the bolus administration of intravenous contrast. RADIATION DOSE REDUCTION: This exam was performed according to the departmental dose-optimization program which includes automated exposure control, adjustment of the mA and/or kV according to patient size and/or use of iterative reconstruction technique. CONTRAST:  80mL OMNIPAQUE IOHEXOL 300 MG/ML  SOLN COMPARISON:  None. FINDINGS: Pharynx and larynx: No evidence of mass or swelling. Widely patent airway. No retropharyngeal fluid. Salivary glands: Multiple stones in the right submandibular gland individually measure up to 8 mm in size. The right submandibular gland is asymmetrically enlarged with mild-to-moderate surrounding inflammation. No submandibular ductal dilatation or ductal stones are identified although dental streak artifact limits assessment in the anterior aspect of the oral cavity. The left submandibular gland and both parotid glands are unremarkable. Thyroid: Unremarkable. Lymph nodes: Small but asymmetrically enlarged right submandibular lymph nodes which are all subcentimeter in short axis, likely reactive. Vascular: Major vascular structures of the neck are grossly patent. Limited intracranial: Unremarkable. Visualized orbits: Bilateral cataract extraction. Mastoids and visualized paranasal sinuses: Clear. Skeleton: No acute osseous abnormality or suspicious osseous lesion. Moderate cervical disc degeneration. Degenerative changes at  the right shoulder with evidence of a chronic AC joint injury. Upper chest: Clear lung apices. Other: None. IMPRESSION: Right submandibular sialolithiasis and sialoadenitis. Electronically Signed   By: Logan Bores M.D.   On: 01/25/2021 21:02   MR SHOULDER RIGHT WO CONTRAST  Result Date: 01/23/2021 CLINICAL DATA:  Right shoulder pain EXAM: MRI OF THE RIGHT SHOULDER WITHOUT CONTRAST TECHNIQUE: Multiplanar, multisequence MR imaging of the shoulder was performed. No intravenous contrast was administered. COMPARISON:  Chest radiograph 09/19/2020. FINDINGS: Rotator cuff: There is mild distal supraspinatus infraspinatus tendinosis. There is thinning of the far anterior fibers of the supraspinatus tendon, likely due to articular and bursal sided tearing, overall intermediate grade. Teres minor tendon is intact. Subscapularis tendon is intact. Muscles: No significant muscle atrophy. Biceps Long Head: Intraarticular and extraarticular portions of the biceps tendon are intact. Acromioclavicular Joint: Elevation of the clavicle with respect to the acromion consistent with chronic AC joint injury. Small amount of subacromial/subdeltoid bursal fluid. Glenohumeral Joint: No joint effusion. Mild to moderate chondrosis. Labrum: Grossly intact, but evaluation is limited by lack of intraarticular fluid/contrast. Bones: Glenohumeral osteophyte formation. There is bony remodeling along the undersurface of the distal clavicle (coronal T1 image 10), which is elevated with respect to the acromion. There is blurring of the posterosuperior cortex at the base of the coracoid near the coracoclavicular ligament  insertion (sagittal T1 image 18). There is a large ossification along the posterior aspect of the coracoclavicular ligaments and periligamentous scarring consistent with chronic coracoclavicular ligament injury/stretching/sprain. Other: There is a fat containing mass along the undersurface of the distal clavicle and between the  trapezius muscle and supraspinatus muscle. Given orientation of the mass and extension beyond the medial margin of the field of view, this is difficult to accurately measure, but measures at least 3.9 x 3.8 by 7.6 cm (sagittal T1 image 17, coronal PD image 12). IMPRESSION: Mild distal supraspinatus and infraspinatus tendinosis, with thinning of the far anterior fibers of the supraspinatus tendon, due to articular and bursal sided tearing, overall intermediate grade. Mild subacromial-subdeltoid bursitis. 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 Large fat containing mass along the undersurface of the distal clavicle in between the trapezius muscle and supraspinatus. This is difficult to measure accurately given orientation and extension beyond the medial margin of the field of view, but measures at least 3.9 x 3.8 x 7.6 cm. 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 Consider referral to orthopedic oncology. Large ossification along the posterior aspect of the coracoclavicular ligaments with bony remodeling of the distal clavicle undersurface and posterosuperior aspect of the base of the coracoid. These findings are consistent with chronic coracoclavicular ligament sprain/stretching related to either a prior AC joint injury or if no reported prior injury possibly related to slow growth of the aforementioned fatty mass resulting in displacement of the clavicle and heterotopic ossification formation. Electronically Signed   By: Maurine Simmering M.D.   On: 01/23/2021 12:28   (Echo, Carotid, EGD, Colonoscopy, ERCP)    Subjective: Pt c/o fatigue    Discharge Exam: Vitals:   01/27/21 0821 01/27/21 1126  BP: (!) 144/83 (!) 143/85  Pulse: 65 71  Resp: 15 16  Temp: (!) 97.3 F (36.3 C) 97.8 F (36.6 C)  SpO2: 97% 97%   Vitals:   01/26/21 2011 01/27/21 0417 01/27/21 0821 01/27/21 1126  BP: (!) 153/90 (!) 148/79 (!) 144/83 (!) 143/85  Pulse: 68 (!) 53 65 71  Resp: 16 18  15 16   Temp: 97.9 F (36.6 C) 97.7 F (36.5 C) (!) 97.3 F (36.3 C) 97.8 F (36.6 C)  TempSrc:      SpO2: 96% 99% 97% 97%  Weight:      Height:        General: Pt is alert, awake, not in acute distress Cardiovascular:  S1/S2 +, no rubs, no gallops Respiratory: CTA bilaterally, no wheezing, no rhonchi Abdominal: Soft, NT, obese, bowel sounds + Extremities: no edema, no cyanosis    The results of significant diagnostics from this hospitalization (including imaging, microbiology, ancillary and laboratory) are listed below for reference.     Microbiology: Recent Results (from the past 240 hour(s))  Resp Panel by RT-PCR (Flu A&B, Covid) Nasopharyngeal Swab     Status: None   Collection Time: 01/25/21  9:53 PM   Specimen: Nasopharyngeal Swab; Nasopharyngeal(NP) swabs in vial transport medium  Result Value Ref Range Status   SARS Coronavirus 2 by RT PCR NEGATIVE NEGATIVE Final    Comment: (NOTE) SARS-CoV-2 target nucleic acids are NOT DETECTED.  The SARS-CoV-2 RNA is generally detectable in upper respiratory specimens during the acute phase of infection. The lowest concentration of SARS-CoV-2 viral copies this assay can detect is 138 copies/mL. A negative result does not preclude SARS-Cov-2 infection and should not be used as the sole basis for treatment or other patient management  decisions. A negative result may occur with  improper specimen collection/handling, submission of specimen other than nasopharyngeal swab, presence of viral mutation(s) within the areas targeted by this assay, and inadequate number of viral copies(<138 copies/mL). A negative result must be combined with clinical observations, patient history, and epidemiological information. The expected result is Negative.  Fact Sheet for Patients:  EntrepreneurPulse.com.au  Fact Sheet for Healthcare Providers:  IncredibleEmployment.be  This test is no t yet approved or  cleared by the [place] FDA and  has been authorized for detection and/or diagnosis of SARS-CoV-2 by FDA under an Emergency Use Authorization (EUA). This EUA will remain  in effect (meaning this test can be used) for the duration of the COVID-19 declaration under Section 564(b)(1) of the Act, 21 U.S.C.section 360bbb-3(b)(1), unless the authorization is terminated  or revoked sooner.       Influenza A by PCR NEGATIVE NEGATIVE Final   Influenza B by PCR NEGATIVE NEGATIVE Final    Comment: (NOTE) The Xpert Xpress SARS-CoV-2/FLU/RSV plus assay is intended as an aid in the diagnosis of influenza from Nasopharyngeal swab specimens and should not be used as a sole basis for treatment. Nasal washings and aspirates are unacceptable for Xpert Xpress SARS-CoV-2/FLU/RSV testing.  Fact Sheet for Patients: EntrepreneurPulse.com.au  Fact Sheet for Healthcare Providers: IncredibleEmployment.be  This test is not yet approved or cleared by the [place] FDA and has been authorized for detection and/or diagnosis of SARS-CoV-2 by FDA under an Emergency Use Authorization (EUA). This EUA will remain in effect (meaning this test can be used) for the duration of the COVID-19 declaration under Section 564(b)(1) of the Act, 21 U.S.C. section 360bbb-3(b)(1), unless the authorization is terminated or revoked.  Performed at [facility], [address] [place], [place] [address]      Labs: BNP (last 3 results) No results for input(s): BNP in the last 8760 hours. Basic Metabolic Panel: Recent Labs  Lab 01/25/21 1502 01/27/21 0501  NA 138 142  K 3.5 3.5  CL 103 110  CO2 26 25  GLUCOSE 126* 135*  BUN 16 13  CREATININE 0.74 0.59  CALCIUM 9.3 9.0   Liver Function Tests: No results for input(s): AST, ALT, ALKPHOS, BILITOT, PROT, ALBUMIN in the last 168 hours. No results for input(s): LIPASE, AMYLASE in the last 168 hours. No results for  input(s): AMMONIA in the last 168 hours. CBC: Recent Labs  Lab 01/25/21 1502 01/27/21 0501  WBC 9.4 8.9  HGB 13.4 12.6  HCT 40.8 37.6  MCV 89.5 88.7  PLT 278 269   Cardiac Enzymes: No results for input(s): CKTOTAL, CKMB, CKMBINDEX, TROPONINI in the last 168 hours. BNP: Invalid input(s): POCBNP CBG: No results for input(s): GLUCAP in the last 168 hours. D-Dimer No results for input(s): DDIMER in the last 72 hours. Hgb A1c No results for input(s): HGBA1C in the last 72 hours. Lipid Profile No results for input(s): CHOL, HDL, LDLCALC, TRIG, CHOLHDL, LDLDIRECT in the last 72 hours. Thyroid function studies No results for input(s): TSH, T4TOTAL, T3FREE, THYROIDAB in the last 72 hours.  Invalid input(s): FREET3 Anemia work up No results for input(s): VITAMINB12, FOLATE, FERRITIN, TIBC, IRON, RETICCTPCT in the last 72 hours. Urinalysis    Component Value Date/Time   COLORURINE YELLOW (A) 08/03/2020 1113   APPEARANCEUR HAZY (A) 08/03/2020 1113   APPEARANCEUR Cloudy (A) 07/22/2018 1253   LABSPEC 1.025 08/03/2020 1113   PHURINE 5.0 08/03/2020 1113   GLUCOSEU NEGATIVE 08/03/2020 1113   HGBUR NEGATIVE  08/03/2020 [place] 08/03/2020 1113   BILIRUBINUR Negative 07/22/2018 1253   KETONESUR 5 (A) 08/03/2020 1113   PROTEINUR NEGATIVE 08/03/2020 1113   NITRITE NEGATIVE 08/03/2020 1113   LEUKOCYTESUR NEGATIVE 08/03/2020 1113   Sepsis Labs Invalid input(s): PROCALCITONIN,  WBC,  LACTICIDVEN Microbiology Recent Results (from the past 240 hour(s))  Resp Panel by RT-PCR (Flu A&B, Covid) Nasopharyngeal Swab     Status: None   Collection Time: 01/25/21  9:53 PM   Specimen: Nasopharyngeal Swab; Nasopharyngeal(NP) swabs in vial transport medium  Result Value Ref Range Status   SARS Coronavirus 2 by RT PCR NEGATIVE NEGATIVE Final    Comment: (NOTE) SARS-CoV-2 target nucleic acids are NOT DETECTED.  The SARS-CoV-2 RNA is generally detectable in upper  respiratory specimens during the acute phase of infection. The lowest concentration of SARS-CoV-2 viral copies this assay can detect is 138 copies/mL. A negative result does not preclude SARS-Cov-2 infection and should not be used as the sole basis for treatment or other patient management decisions. A negative result may occur with  improper specimen collection/handling, submission of specimen other than nasopharyngeal swab, presence of viral mutation(s) within the areas targeted by this assay, and inadequate number of viral copies(<138 copies/mL). A negative result must be combined with clinical observations, patient history, and epidemiological information. The expected result is Negative.  Fact Sheet for Patients:  EntrepreneurPulse.com.au  Fact Sheet for Healthcare Providers:  IncredibleEmployment.be  This test is no t yet approved or cleared by the [place] FDA and  has been authorized for detection and/or diagnosis of SARS-CoV-2 by FDA under an Emergency Use Authorization (EUA). This EUA will remain  in effect (meaning this test can be used) for the duration of the COVID-19 declaration under Section 564(b)(1) of the Act, 21 U.S.C.section 360bbb-3(b)(1), unless the authorization is terminated  or revoked sooner.       Influenza A by PCR NEGATIVE NEGATIVE Final   Influenza B by PCR NEGATIVE NEGATIVE Final    Comment: (NOTE) The Xpert Xpress SARS-CoV-2/FLU/RSV plus assay is intended as an aid in the diagnosis of influenza from Nasopharyngeal swab specimens and should not be used as a sole basis for treatment. Nasal washings and aspirates are unacceptable for Xpert Xpress SARS-CoV-2/FLU/RSV testing.  Fact Sheet for Patients: EntrepreneurPulse.com.au  Fact Sheet for Healthcare Providers: IncredibleEmployment.be  This test is not yet approved or cleared by the [place] FDA and has been  authorized for detection and/or diagnosis of SARS-CoV-2 by FDA under an Emergency Use Authorization (EUA). This EUA will remain in effect (meaning this test can be used) for the duration of the COVID-19 declaration under Section 564(b)(1) of the Act, 21 U.S.C. section 360bbb-3(b)(1), unless the authorization is terminated or revoked.  Performed at [facility], [facility], [facility], [address] [place], [place] [address]      Time coordinating discharge: Over 30 minutes  SIGNED:   Wyvonnia Dusky, MD  [facility] 01/27/2021, 11:48 AM Pager   If 7PM-7AM, please contact night-coverage

## 2021-01-27 NOTE — Progress Notes (Signed)
Patient is discharging to home with transportation provided by spouse.  Patient received AVS with education provided by primary RN with all questions answered and no questions or concerns at this time.  PIV has been removed prior to discharge and patient is awaiting transportation by volunteer via wheelchair off the unit.

## 2021-02-15 ENCOUNTER — Ambulatory Visit: Payer: Medicare PPO | Attending: Orthopedic Surgery

## 2021-02-15 ENCOUNTER — Other Ambulatory Visit: Payer: Self-pay

## 2021-02-15 DIAGNOSIS — M25511 Pain in right shoulder: Secondary | ICD-10-CM | POA: Insufficient documentation

## 2021-02-15 DIAGNOSIS — M25611 Stiffness of right shoulder, not elsewhere classified: Secondary | ICD-10-CM | POA: Insufficient documentation

## 2021-02-15 DIAGNOSIS — G8929 Other chronic pain: Secondary | ICD-10-CM | POA: Insufficient documentation

## 2021-02-15 NOTE — Therapy (Signed)
[facility] [address], [place], [address] Phone: [phone]   Fax:  [phone]  Physical Therapy Evaluation  Patient Details  Name: Savannah Wilkins MRN: [id number] Date of Birth: Mar 28, 1950 Referring Provider (PT): Waneta Martins, [place] (Orthopedic Oncology)   Encounter Date: 02/15/2021   PT End of Session - 02/15/21 0906     Visit Number 1    Number of Visits 10    Date for PT Re-Evaluation 03/29/21    Authorization Type [employer]    Authorization Time Period 02/15/21-03/29/21    Progress Note Due on Visit 10    PT Start Time 0800    PT Stop Time 0850    PT Time Calculation (min) 50 min    Activity Tolerance Patient tolerated treatment well;Patient limited by pain    Behavior During Therapy [facility] for tasks assessed/performed             Past Medical History:  Diagnosis Date   Arthritis    DVT (deep venous thrombosis) ([place]) 2015   pulmonary emboli   Headache    history of ileus 09/2020   History of kidney stones    Lichen sclerosus    Pneumonia 2015   PONV (postoperative nausea and vomiting)    Pulmonary emboli ([place]) 2015   Restless leg syndrome     Past Surgical History:  Procedure Laterality Date   CHOLECYSTECTOMY  1996   COLONOSCOPY  2015   CYST EXCISION Right 2010   pinkie finger   dcr Right    right eye   EXTRACORPOREAL SHOCK WAVE LITHOTRIPSY Bilateral 2006   twice on left and once on right   EXTRACORPOREAL SHOCK WAVE LITHOTRIPSY Right 07/25/2018   Procedure: EXTRACORPOREAL SHOCK WAVE LITHOTRIPSY (ESWL);  Surgeon: Billey Co, MD;  Location: [facility] ORS;  Service: Urology;  Laterality: Right;   EYE SURGERY Bilateral 2013   cataract extractions   HYSTEROSCOPY  2015   IVC FILTER INSERTION N/A 04/06/2020   Procedure: IVC FILTER INSERTION;  Surgeon: Katha Cabal, MD;  Location: [facility] [facility];  Service: Cardiovascular;  Laterality: N/A;   IVC FILTER REMOVAL  N/A 10/26/2020   Procedure: IVC FILTER REMOVAL;  Surgeon: Katha Cabal, MD;  Location: [place] [facility];  Service: Cardiovascular;  Laterality: N/A;   IVC FILTER REMOVAL N/A 11/24/2020   Procedure: IVC FILTER REMOVAL;  Surgeon: Katha Cabal, MD;  Location: [place] [facility];  Service: Cardiovascular;  Laterality: N/A;   kidney stone removal Right 2006, 2013   right ureteroscopic stone removals   RETINAL TEAR REPAIR CRYOTHERAPY Bilateral    RIGHT 2007 AND 2008; LEFT 2012   TONSILLECTOMY  1984   TOTAL HIP ARTHROPLASTY Right 04/08/2020   Procedure: TOTAL HIP ARTHROPLASTY ANTERIOR APPROACH;  Surgeon: Hessie Knows, MD;  Location: [facility];  Service: Orthopedics;  Laterality: Right;   TOTAL HIP ARTHROPLASTY Left 08/12/2020   Procedure: TOTAL HIP ARTHROPLASTY ANTERIOR APPROACH;  Surgeon: Hessie Knows, MD;  Location: [facility] OR[facility];  Service: Orthopedics;  Laterality: Left;   TUBAL LIGATION  1985    There were no vitals filed for this visit.    Subjective Assessment - 02/15/21 0805     Subjective Pt presents for evaluation o Rt shoulder pain at request of orthopedic oncologist, wants to see what relief can be made without surgical intervention.    Pertinent History Hydia Wilkins is a 72yoF who comes to [place] on 1/31 for Rt  shoulder pain that began 2 years ago. Pt has both restricted arm movement and pain with use, interupted sleep at night. Pt was seen by orthopedics who noted a space occupying lesion in the [place], as well as areas of calcification, AC joint DJD, and involvement of some rotator cuff tissue. Pt was referred to orthopedic oncology who subsequently ruled the growth in shoulder as benign, recommended pt try physical therapy prior to any surgical interventions for tumor excision or other orthopedic correction. PMH: bilat THA (2022- direct anterior with Dr. Rudene Christians), kidney stones, gall stones, Rt salivary stones pending surgery in February.    How long can you sit  comfortably? Not related    How long can you stand comfortably? Not related    How long can you walk comfortably? Not related    Diagnostic tests Rt shoulder CT 1/25: IMPRESSION:1. The right suprascapular mass apparently has 2 components including a large fat attenuation component and an area of either partial ossification or adjacent osseous excrescence extending from the coracoid to contact the undersurface of the clavicle. Differential considerations include posttraumatic heterotopic ossification, osteochondroma with coincident lipoma, or partial ossification of a large lipoma. No significant soft tissue attenuation component is identified. 2. No evidence of acute fracture or acute dislocation of the right shoulder. There is chronic abnormality of the right AC joint as described above. FINDINGS: The right clavicle is abnormally elevated relative to the right  acromion but does contact a minimal portion of the right acromial articular  surfacelarge fat density mass superior to the supraspinatus muscle belly. Associated with this fat attenuation mass, there is a large area of heterotopic ossification extending superiorly from the coracoid process to contact the undersurface of the clavicle and portions of the acromioncould represent posttraumatic heterotopic ossification or an unusual osteochondromaAn unusual lipoma with partial ossification is somewhat favoredThere are degenerative changes of the greater tuberosity, likely  related to the rotator cuff insertion. The supraspinatus and infraspinatus  tendons appear generally intact. Teres minor tendon appears generally  intact. portions of the subscapularis tendon are likely intact. No  definitive osseous erosions are identified. There is prominent fat inferior  to the right clavicle as seen on the previous MRI.    Patient Stated Goals avoid unnecessary surgery, improve sleep quality, return to pain free use of arm in ADL/IADL    Currently in Pain? Yes     Pain Score 2    Rt shoulder, lateral and superior   Pain Orientation Right    Pain Type Chronic pain    Aggravating Factors  sleep positions, active use near end-range flexion, abduction, and/or external rotation; donning/dofing bra and shirts. Getting ice out of refrigerator.                [facility] PT Assessment - 02/15/21 0001       Assessment   Medical Diagnosis Chronic Right Shoulder Pain    Referring Provider (PT) Waneta Martins, [place]   Orthopedic Oncology   Onset Date/Surgical Date --   2021   Hand Dominance Right    Prior Therapy None      Precautions   Precautions None      Balance Screen   Has the patient fallen in the past 6 months No    Has the patient had a decrease in activity level because of a fear of falling?  No    Is the patient reluctant to leave their home because of a fear of falling?  No  Prior Function   Level of Independence Independent with basic ADLs;Independent with gait    Vocation Retired      Observation/Other Assessments   Focus on Therapeutic Outcomes (FOTO)  55      Sensation   Light Touch Appears Intact   pt denies any RUE paresthesias or numbness     Posture/Postural Control   Posture/Postural Control Postural limitations    Postural Limitations Increased thoracic kyphosis;Decreased lumbar lordosis;Forward head;Rounded Shoulders      ROM / Strength   AROM / PROM / Strength AROM;PROM;Strength      AROM   AROM Assessment Site Shoulder    Right/Left Shoulder Right;Left    Right Shoulder Flexion 114 Degrees    end-range pain; pain with eccentric return   Right Shoulder ABduction 84 Degrees    end-range pain; pain with eccentric return   Right Shoulder Internal Rotation --   T1   Right Shoulder External Rotation --   T8   Right Shoulder Horizontal  ADduction 104 Degrees    tightness and restriction   Left Shoulder Flexion 125 Degrees   standing, degrees to gravity   Left Shoulder ABduction 185 Degrees    Left Shoulder  Internal Rotation --   T1   Left Shoulder External Rotation --   T6   Left Shoulder Horizontal ADduction 110 Degrees      PROM   PROM Assessment Site Shoulder    Right/Left Shoulder Right;Left    Right Shoulder Flexion 129 Degrees    end-range pain   Right Shoulder ABduction 95 Degrees    end-range pain   Right Shoulder Internal Rotation 54 Degrees    end range pain   Right Shoulder External Rotation 78 Degrees    end-range pain     Strength   Strength Assessment Site Shoulder;Elbow    Right/Left Shoulder Right;Left    Right Shoulder Flexion 3+/5    pain too severe to resist much   Right Shoulder ABduction 4+/5    pain less limiting   Right Shoulder Internal Rotation 4-/5    significant pain limited (supine, in 70 degrees ABDCT 0 degrees rotation)   Right Shoulder External Rotation 5/5   minimal pain (supine, 70* ADBCT, 0* ER)   Left Shoulder Flexion 5/5    Left Shoulder ABduction 5/5    Right/Left Elbow Right;Left    Left Elbow Flexion 5/5    Left Elbow Extension 5/5               Objective measurements completed on examination: See above findings.       [facility] Adult PT Treatment/Exercise - 02/15/21 0001       Exercises   Exercises Shoulder      Shoulder Exercises: Standing   Retraction AROM;5 reps;Both   5 sec H     Shoulder Exercises: Isometric Strengthening   Extension 5X5"    External Rotation 5X5"    Internal Rotation 5X5"    ABduction 5X5"    ADduction 5X5"      Manual Therapy   Manual Therapy Joint mobilization;Manual Traction    Manual Traction Rt shoulder distraction in neutral, axial towel   3x60secH                    PT Education - 02/15/21 0905     Education Details Use Left arm when able for IADL/ADL; minimize amount of aggravating activity in RUE while beginning isometric loading    Person(s) Educated Patient  Methods Explanation    Comprehension Verbalized understanding;Tactile cues required              PT Short  Term Goals - 02/15/21 0925       PT SHORT TERM GOAL #1   Title After 2 weeks pt to report and demonstrate correct performance of HEP with favorable affect on symptoms.    Time 2    Period Weeks    Status New    Target Date 03/01/21      PT SHORT TERM GOAL #2   Title After 4 weeks pt to report less restrictive pain during ADL/IADL with RUE functional use.    Time 4    Period Weeks    Status New    Target Date 03/15/21               PT Long Term Goals - 02/15/21 0926       PT LONG TERM GOAL #1   Title Pt to improve FOTO score by >10 points to indicate improve perception of ability to perform ADL/IADL.    Time 4    Period Weeks    Status New    Target Date 03/15/21      PT LONG TERM GOAL #2   Title Pt to demonstrate 5/5 strength in Rt shoulder flexion, abduction, and IR without pain inhibition.    Baseline At eval: pain precludes full force production    Time 4    Period Weeks    Status New    Target Date 03/15/21                    Plan - 02/15/21 0907     Clinical Impression Statement Pt presenting to PT clinice after referral from orthopedic oncology, chronic progressive pain and movement restriction, imaging revealing of degenerative changes to shoulder joint as well as benign space occupying lesion. Exam reveals of end-range pain in all planes, >30% loss of ROM in shoulder globally, pain with resisted flexion, internal rotation, and abduction. Pt is unable to perform her typical self care and household actiivty without pain and restrictive tightness. Pt has disrupted sleep due to shoulder pain at night. Pt will benefit from PT intervention to improve pain, strength, and ROM to facility return to independent performance of ADL andIADL with tolerance similar to PLOF.    Personal Factors and Comorbidities Past/Current Experience;Time since onset of injury/illness/exacerbation    Examination-Activity Limitations Bathing;Bed Mobility;Reach  Overhead;Carry;Sleep;Dressing    Examination-Participation Restrictions Cleaning;Meal Prep;Driving;Laundry    Stability/Clinical Decision Making Unstable/Unpredictable    Clinical Decision Making Moderate    Rehab Potential Good    PT Frequency 2x / week    PT Duration 6 weeks    PT Treatment/Interventions ADLs/Self Care Home Management;Cryotherapy;Electrical Stimulation;Moist Heat;Therapeutic activities;Therapeutic exercise;Balance training;Patient/family education;Passive range of motion;Dry needling    PT Next Visit Plan Review HEP and obtain feedback on home performance; work on eccentric control for dynamic joint stabilization in tolerated ranges.    PT Home Exercise Plan [phone] (isometric Rt shoulder ABDCT, ADD, IR, flexion, Extension, scapular retraction)    Consulted and Agree with Plan of Care Patient             Patient will benefit from skilled therapeutic intervention in order to improve the following deficits and impairments:  Decreased range of motion, Decreased activity tolerance, Decreased strength, Postural dysfunction  Visit Diagnosis: Stiffness of right shoulder, not elsewhere classified  Chronic right shoulder pain     Problem  List Patient Active Problem List   Diagnosis Date Noted   Infectious sialoadenitis of right submandibular gland 01/25/2021   Submandibular sialolithiasis 01/25/2021   Status post total hip replacement, left 08/12/2020   S/P hip replacement 04/08/2020   Restless legs 07/02/2018   Postmenopausal bleeding 07/02/2018   Lesion of vulva 07/02/2018   Nephrolithiasis 07/02/2018   Cataract 07/02/2018   Medicare annual wellness visit, initial 11/09/2017   Headache disorder 08/26/2017   Obesity (BMI 35.0-39.9 without comorbidity) 81/85/6314   Lichen sclerosus 97/02/6376   History of pulmonary embolus (PE) 07/09/2017   Chronic daily headache 07/09/2017   DVT of leg (deep venous thrombosis) ([place]) 08/17/2013   Hypoxia 08/16/2013    Pleuritic chest pain 08/15/2013   Acute pulmonary embolism ([place]) 08/15/2013   Arthropathy of pelvic region and thigh 07/03/2012   Arthritis, hip 07/03/2012   9:30 AM, 02/15/21 Etta Grandchild, PT, DPT Physical Therapist - [place] [phone] (Office)   Brent C, PT 02/15/2021, 9:30 AM  [facility] [address] [place], [address] Phone: [phone]   Fax:  [phone]  Name: Leonda Cristo MRN: [id number] Date of Birth: 05/20/1950

## 2021-02-17 ENCOUNTER — Ambulatory Visit: Payer: Medicare PPO | Attending: Orthopedic Surgery

## 2021-02-17 ENCOUNTER — Other Ambulatory Visit: Payer: Self-pay

## 2021-02-17 DIAGNOSIS — M25511 Pain in right shoulder: Secondary | ICD-10-CM | POA: Diagnosis present

## 2021-02-17 DIAGNOSIS — M25611 Stiffness of right shoulder, not elsewhere classified: Secondary | ICD-10-CM | POA: Insufficient documentation

## 2021-02-17 DIAGNOSIS — G8929 Other chronic pain: Secondary | ICD-10-CM | POA: Insufficient documentation

## 2021-02-17 NOTE — Therapy (Signed)
[facility] [address], [place], [address] Phone: [phone]   Fax:  [phone]  Physical Therapy Treatment  Patient Details  Name: Savannah Wilkins MRN: [id number] Date of Birth: 03-26-1950 Referring Provider (PT): Waneta Martins, [place] (Orthopedic Oncology)   Encounter Date: 02/17/2021   PT End of Session - 02/17/21 0805     Visit Number 2    Number of Visits 10    Date for PT Re-Evaluation 03/29/21    Authorization Type [employer]    Authorization Time Period 02/15/21-03/29/21    Progress Note Due on Visit 10    PT Start Time 0800    PT Stop Time 0838    PT Time Calculation (min) 38 min    Activity Tolerance Patient tolerated treatment well;Patient limited by pain    Behavior During Therapy [facility] for tasks assessed/performed             Past Medical History:  Diagnosis Date   Arthritis    DVT (deep venous thrombosis) ([place]) 2015   pulmonary emboli   Headache    history of ileus 09/2020   History of kidney stones    Lichen sclerosus    Pneumonia 2015   PONV (postoperative nausea and vomiting)    Pulmonary emboli ([place]) 2015   Restless leg syndrome     Past Surgical History:  Procedure Laterality Date   CHOLECYSTECTOMY  1996   COLONOSCOPY  2015   CYST EXCISION Right 2010   pinkie finger   dcr Right    right eye   EXTRACORPOREAL SHOCK WAVE LITHOTRIPSY Bilateral 2006   twice on left and once on right   EXTRACORPOREAL SHOCK WAVE LITHOTRIPSY Right 07/25/2018   Procedure: EXTRACORPOREAL SHOCK WAVE LITHOTRIPSY (ESWL);  Surgeon: Billey Co, MD;  Location: [facility];  Service: Urology;  Laterality: Right;   EYE SURGERY Bilateral 2013   cataract extractions   HYSTEROSCOPY  2015   IVC FILTER INSERTION N/A 04/06/2020   Procedure: IVC FILTER INSERTION;  Surgeon: Katha Cabal, MD;  Location: [facility] [facility];  Service: Cardiovascular;  Laterality: N/A;   IVC FILTER REMOVAL  N/A 10/26/2020   Procedure: IVC FILTER REMOVAL;  Surgeon: Katha Cabal, MD;  Location: [place] [facility];  Service: Cardiovascular;  Laterality: N/A;   IVC FILTER REMOVAL N/A 11/24/2020   Procedure: IVC FILTER REMOVAL;  Surgeon: Katha Cabal, MD;  Location: [place] [facility];  Service: Cardiovascular;  Laterality: N/A;   kidney stone removal Right 2006, 2013   right ureteroscopic stone removals   RETINAL TEAR REPAIR CRYOTHERAPY Bilateral    RIGHT 2007 AND 2008; LEFT 2012   TONSILLECTOMY  1984   TOTAL HIP ARTHROPLASTY Right 04/08/2020   Procedure: TOTAL HIP ARTHROPLASTY ANTERIOR APPROACH;  Surgeon: Hessie Knows, MD;  Location: [facility];  Service: Orthopedics;  Laterality: Right;   TOTAL HIP ARTHROPLASTY Left 08/12/2020   Procedure: TOTAL HIP ARTHROPLASTY ANTERIOR APPROACH;  Surgeon: Hessie Knows, MD;  Location: [facility];  Service: Orthopedics;  Laterality: Left;   TUBAL LIGATION  1985    There were no vitals filed for this visit.   Subjective Assessment - 02/17/21 0801     Subjective Pt doing well today, pain is reasonable. Pt has gotten into a good routine with her HEP since issue, no major difficulty, no adverse affects. Pt has not been sleeping well last few months including last night.    Pertinent History Savannah Wilkins is  a 49yoF who comes to [place] on 1/31 for Rt shoulder pain that began 2 years ago. Pt has both restricted arm movement and pain with use, interupted sleep at night. Pt was seen by orthopedics who noted a space occupying lesion in the [place], as well as areas of calcification, AC joint DJD, and involvement of some rotator cuff tissue. Pt was referred to orthopedic oncology who subsequently ruled the growth in shoulder as benign, recommended pt try physical therapy prior to any surgical interventions for tumor excision or other orthopedic correction. PMH: bilat THA (2022- direct anterior with Dr. Rudene Christians), kidney stones, gall stones, Rt salivary stones  pending surgery in February.    Currently in Pain? No/denies            INTERVENTION: -RUE pulleys x20 flexion, x20 abduction -green ball RUE ADD squeeze 15x3secH   -RUE shoulder lateral distraction in neutral 2x30sec c axillary towel  -RUE shoulder LAD (caudal) in neutral 2x30sec, hands in cubital fossa  -PVC bench press 1x12 (position dependent for pain-free performance)  -Supine RUE ABDCT arm slides on slider 1x15 (tolerated range, elbow supported on airex pad) -Supine RUE eccentric ER + concentric IR to neutral in slight abduction 1x15 -Supine RUE ER from belly to neutral 1x15 (elbow supported on airex pad) -extension over chairback x15      PT Education - 02/17/21 0804     Education Details education on use of pulleys    Person(s) Educated Patient    Methods Explanation    Comprehension Verbalized understanding;Need further instruction              PT Short Term Goals - 02/15/21 0925       PT SHORT TERM GOAL #1   Title After 2 weeks pt to report and demonstrate correct performance of HEP with favorable affect on symptoms.    Time 2    Period Weeks    Status New    Target Date 03/01/21      PT SHORT TERM GOAL #2   Title After 4 weeks pt to report less restrictive pain during ADL/IADL with RUE functional use.    Time 4    Period Weeks    Status New    Target Date 03/15/21               PT Long Term Goals - 02/15/21 0926       PT LONG TERM GOAL #1   Title Pt to improve FOTO score by >10 points to indicate improve perception of ability to perform ADL/IADL.    Time 4    Period Weeks    Status New    Target Date 03/15/21      PT LONG TERM GOAL #2   Title Pt to demonstrate 5/5 strength in Rt shoulder flexion, abduction, and IR without pain inhibition.    Baseline At eval: pain precludes full force production    Time 4    Period Weeks    Status New    Target Date 03/15/21                   Plan - 02/17/21 0806     Clinical  Impression Statement Introduced AA/ROM woith pulleys, good tolerance. Repeat HEP. Manual therapies to address joint stiffness. Pt tolerates ession well in general without significant exacerbation of pain or fatigue. Pt does have intermittent sharp, sudden pains, most of which are position dependent and onset during eccentric movements.    Personal  Factors and Comorbidities Past/Current Experience;Time since onset of injury/illness/exacerbation    Examination-Activity Limitations Bathing;Bed Mobility;Reach Overhead;Carry;Sleep;Dressing    Examination-Participation Restrictions Cleaning;Meal Prep;Driving;Laundry    Stability/Clinical Decision Making Unstable/Unpredictable    Clinical Decision Making Moderate    Rehab Potential Good    PT Frequency 2x / week    PT Duration 6 weeks    PT Treatment/Interventions ADLs/Self Care Home Management;Cryotherapy;Electrical Stimulation;Moist Heat;Therapeutic activities;Therapeutic exercise;Balance training;Patient/family education;Passive range of motion;Dry needling    PT Next Visit Plan Review HEP and obtain feedback on home performance; work on eccentric control for dynamic joint stabilization in tolerated ranges.    PT Home Exercise Plan [phone] (isometric Rt shoulder ABDCT, ADD, IR, flexion, Extension, scapular retraction)    Consulted and Agree with Plan of Care Patient             Patient will benefit from skilled therapeutic intervention in order to improve the following deficits and impairments:  Decreased range of motion, Decreased activity tolerance, Decreased strength, Postural dysfunction  Visit Diagnosis: Stiffness of right shoulder, not elsewhere classified  Chronic right shoulder pain     Problem List Patient Active Problem List   Diagnosis Date Noted   Infectious sialoadenitis of right submandibular gland 01/25/2021   Submandibular sialolithiasis 01/25/2021   Status post total hip replacement, left 08/12/2020   S/P hip  replacement 04/08/2020   Restless legs 07/02/2018   Postmenopausal bleeding 07/02/2018   Lesion of vulva 07/02/2018   Nephrolithiasis 07/02/2018   Cataract 07/02/2018   Medicare annual wellness visit, initial 11/09/2017   Headache disorder 08/26/2017   Obesity (BMI 35.0-39.9 without comorbidity) 45/99/7741   Lichen sclerosus 42/39/5320   History of pulmonary embolus (PE) 07/09/2017   Chronic daily headache 07/09/2017   DVT of leg (deep venous thrombosis) ([place]) 08/17/2013   Hypoxia 08/16/2013   Pleuritic chest pain 08/15/2013   Acute pulmonary embolism ([place]) 08/15/2013   Arthropathy of pelvic region and thigh 07/03/2012   Arthritis, hip 07/03/2012    8:36 AM, 02/17/21 Etta Grandchild, PT, DPT Physical Therapist - [place] [phone] (Office)   [place] C, PT 02/17/2021, 8:14 AM  [facility] [address], [place], [address] Phone: [phone]   Fax:  [phone]  Name: Savannah Wilkins MRN: [id number] Date of Birth: [birth year]

## 2021-02-21 ENCOUNTER — Ambulatory Visit: Payer: Medicare PPO | Admitting: Physical Therapy

## 2021-02-24 ENCOUNTER — Ambulatory Visit: Payer: Medicare PPO

## 2021-02-24 ENCOUNTER — Other Ambulatory Visit: Payer: Self-pay

## 2021-02-24 DIAGNOSIS — M25611 Stiffness of right shoulder, not elsewhere classified: Secondary | ICD-10-CM

## 2021-02-24 DIAGNOSIS — G8929 Other chronic pain: Secondary | ICD-10-CM

## 2021-02-24 NOTE — Therapy (Signed)
[facility] [address], [place], [address] Phone: [phone]   Fax:  [phone]  Physical Therapy Treatment  Patient Details  Name: Savannah Wilkins MRN: [id number] Date of Birth: December 04, 1950 Referring Provider (PT): Waneta Martins, [place] (Orthopedic Oncology)   Encounter Date: 02/24/2021   PT End of Session - 02/24/21 0850     Visit Number 3    Number of Visits 10    Date for PT Re-Evaluation 03/29/21    Authorization Type [employer]    Authorization Time Period 02/15/21-03/29/21    Progress Note Due on Visit 10    PT Start Time 0847    PT Stop Time 0927    PT Time Calculation (min) 40 min    Activity Tolerance Patient tolerated treatment well;Patient limited by pain    Behavior During Therapy [facility] for tasks assessed/performed             Past Medical History:  Diagnosis Date   Arthritis    DVT (deep venous thrombosis) ([place]) 2015   pulmonary emboli   Headache    history of ileus 09/2020   History of kidney stones    Lichen sclerosus    Pneumonia 2015   PONV (postoperative nausea and vomiting)    Pulmonary emboli ([place]) 2015   Restless leg syndrome     Past Surgical History:  Procedure Laterality Date   CHOLECYSTECTOMY  1996   COLONOSCOPY  2015   CYST EXCISION Right 2010   pinkie finger   dcr Right    right eye   EXTRACORPOREAL SHOCK WAVE LITHOTRIPSY Bilateral 2006   twice on left and once on right   EXTRACORPOREAL SHOCK WAVE LITHOTRIPSY Right 07/25/2018   Procedure: EXTRACORPOREAL SHOCK WAVE LITHOTRIPSY (ESWL);  Surgeon: Billey Co, MD;  Location: [facility];  Service: Urology;  Laterality: Right;   EYE SURGERY Bilateral 2013   cataract extractions   HYSTEROSCOPY  2015   IVC FILTER INSERTION N/A 04/06/2020   Procedure: IVC FILTER INSERTION;  Surgeon: Katha Cabal, MD;  Location: [facility];  Service: Cardiovascular;  Laterality: N/A;   IVC FILTER REMOVAL  N/A 10/26/2020   Procedure: IVC FILTER REMOVAL;  Surgeon: Katha Cabal, MD;  Location: [place];  Service: Cardiovascular;  Laterality: N/A;   IVC FILTER REMOVAL N/A 11/24/2020   Procedure: IVC FILTER REMOVAL;  Surgeon: Katha Cabal, MD;  Location: [place] [facility];  Service: Cardiovascular;  Laterality: N/A;   kidney stone removal Right 2006, 2013   right ureteroscopic stone removals   RETINAL TEAR REPAIR CRYOTHERAPY Bilateral    RIGHT 2007 AND 2008; LEFT 2012   TONSILLECTOMY  1984   TOTAL HIP ARTHROPLASTY Right 04/08/2020   Procedure: TOTAL HIP ARTHROPLASTY ANTERIOR APPROACH;  Surgeon: Hessie Knows, MD;  Location: [facility];  Service: Orthopedics;  Laterality: Right;   TOTAL HIP ARTHROPLASTY Left 08/12/2020   Procedure: TOTAL HIP ARTHROPLASTY ANTERIOR APPROACH;  Surgeon: Hessie Knows, MD;  Location: [facility];  Service: Orthopedics;  Laterality: Left;   TUBAL LIGATION  1985    There were no vitals filed for this visit.   Subjective Assessment - 02/24/21 0847     Subjective Pt reports increased soreness along the outside of the shoulder last couple days. Pt reports HEP conitnues to go well.    Pertinent History Savannah Wilkins is a 54yoF who comes to [place] on 1/31 for Rt shoulder pain that began 2  years ago. Pt has both restricted arm movement and pain with use, interupted sleep at night. Pt was seen by orthopedics who noted a space occupying lesion in the [place], as well as areas of calcification, AC joint DJD, and involvement of some rotator cuff tissue. Pt was referred to orthopedic oncology who subsequently ruled the growth in shoulder as benign, recommended pt try physical therapy prior to any surgical interventions for tumor excision or other orthopedic correction. PMH: bilat THA (2022- direct anterior with Dr. Rudene Christians), kidney stones, gall stones, Rt salivary stones pending surgery in February.    Currently in Pain? Yes    Pain Score 2    lateral and  anterior shoulder           INTERVENTION THIS DATE: -shoulder pulleys x3 minutes (plane ad lib) *supine palpation of middle deltoid: allodynic without ability to tolerate deep palpation *application of tens from [facility] to lateral deltoid (pt turns up to comfort) -Rt shoulder add towel squeeze 1x15x3secH  -Rt shoulder chest press 1x15 (tightness at 90 degrees) -Rt shoulder external rotation from belly to 0 degrees 1x10   Standing  -shoulder ABDCT resting to 70 degrees 1x15 -standing Rt shoulder cable row 1x15 @ 5lb (standing on 3" plastic step) -shoulder ABDCT resting to 70 degrees 1x15 -Right elbow flexion, supinated 4lb FW, 1x15 -standing Rt shoulder cable row 1x15 @ 5lb (standing on 3" plastic step) -GreenTB neck scarf triceps extension 1x15 -Right elbow flexion, supinated 4lb FW, 1x15 -bilat scap shrug 1x15    PT Education - 02/24/21 0850     Education Details potential use of heat at home.    Person(s) Educated Patient    Methods Explanation    Comprehension Verbalized understanding              PT Short Term Goals - 02/15/21 0925       PT SHORT TERM GOAL #1   Title After 2 weeks pt to report and demonstrate correct performance of HEP with favorable affect on symptoms.    Time 2    Period Weeks    Status New    Target Date 03/01/21      PT SHORT TERM GOAL #2   Title After 4 weeks pt to report less restrictive pain during ADL/IADL with RUE functional use.    Time 4    Period Weeks    Status New    Target Date 03/15/21               PT Long Term Goals - 02/15/21 0926       PT LONG TERM GOAL #1   Title Pt to improve FOTO score by >10 points to indicate improve perception of ability to perform ADL/IADL.    Time 4    Period Weeks    Status New    Target Date 03/15/21      PT LONG TERM GOAL #2   Title Pt to demonstrate 5/5 strength in Rt shoulder flexion, abduction, and IR without pain inhibition.    Baseline At eval: pain precludes full force  production    Time 4    Period Weeks    Status New    Target Date 03/15/21                   Plan - 02/24/21 0851     Clinical Impression Statement ROM remains limited with pain limitations early in movement. Conitnued to work on AA/ROM, integrated TENS during session as well due  to hyperalgesia along the lateral shoulder musculature. Pt continues to work hard on HEP daily. Pt able to advance to standing Right arm resisted exercises in partial range this date. Pt reports decreased pain at end of session.    Personal Factors and Comorbidities Past/Current Experience;Time since onset of injury/illness/exacerbation    Examination-Activity Limitations Bathing;Bed Mobility;Reach Overhead;Carry;Sleep;Dressing    Examination-Participation Restrictions Cleaning;Meal Prep;Driving;Laundry    Stability/Clinical Decision Making Unstable/Unpredictable    Clinical Decision Making Moderate    Rehab Potential Good    PT Frequency 2x / week    PT Duration 6 weeks    PT Treatment/Interventions ADLs/Self Care Home Management;Cryotherapy;Electrical Stimulation;Moist Heat;Therapeutic activities;Therapeutic exercise;Balance training;Patient/family education;Passive range of motion;Dry needling    PT Next Visit Plan TENS for pain control, advance from isometric to limited-range isokintetic loading (low to moderate)    PT Home Exercise Plan [phone] (isometric Rt shoulder ABDCT, ADD, IR, flexion, Extension, scapular retraction)    Consulted and Agree with Plan of Care Patient             Patient will benefit from skilled therapeutic intervention in order to improve the following deficits and impairments:  Decreased range of motion, Decreased activity tolerance, Decreased strength, Postural dysfunction  Visit Diagnosis: Stiffness of right shoulder, not elsewhere classified  Chronic right shoulder pain     Problem List Patient Active Problem List   Diagnosis Date Noted   Infectious  sialoadenitis of right submandibular gland 01/25/2021   Submandibular sialolithiasis 01/25/2021   Status post total hip replacement, left 08/12/2020   S/P hip replacement 04/08/2020   Restless legs 07/02/2018   Postmenopausal bleeding 07/02/2018   Lesion of vulva 07/02/2018   Nephrolithiasis 07/02/2018   Cataract 07/02/2018   Medicare annual wellness visit, initial 11/09/2017   Headache disorder 08/26/2017   Obesity (BMI 35.0-39.9 without comorbidity) 82/80/0349   Lichen sclerosus 17/91/5056   History of pulmonary embolus (PE) 07/09/2017   Chronic daily headache 07/09/2017   DVT of leg (deep venous thrombosis) ([place]) 08/17/2013   Hypoxia 08/16/2013   Pleuritic chest pain 08/15/2013   Acute pulmonary embolism ([place]) 08/15/2013   Arthropathy of pelvic region and thigh 07/03/2012   Arthritis, hip 07/03/2012   9:21 AM, 02/24/21 Etta Grandchild, PT, DPT Physical Therapist - [place] [phone] (Office)   Iliff C, PT 02/24/2021, 9:08 AM  [facility] [address] [place], [address] Phone: [phone]   Fax:  [phone]  Name: Savannah Wilkins MRN: [id number] Date of Birth: 08-Mar-1950

## 2021-02-28 ENCOUNTER — Encounter: Payer: Self-pay | Admitting: Physical Therapy

## 2021-02-28 ENCOUNTER — Ambulatory Visit: Payer: Medicare PPO

## 2021-02-28 ENCOUNTER — Other Ambulatory Visit: Payer: Self-pay

## 2021-02-28 DIAGNOSIS — M25611 Stiffness of right shoulder, not elsewhere classified: Secondary | ICD-10-CM | POA: Diagnosis not present

## 2021-02-28 DIAGNOSIS — G8929 Other chronic pain: Secondary | ICD-10-CM

## 2021-02-28 NOTE — Therapy (Signed)
[facility] [address], [place], [address] Phone: [phone]   Fax:  [phone]  Physical Therapy Treatment  Patient Details  Name: Savannah Wilkins MRN: [id number] Date of Birth: 1950-03-15 Referring Provider (PT): Waneta Martins, [place] (Orthopedic Oncology)   Encounter Date: 02/28/2021   PT End of Session - 02/28/21 0757     Visit Number 4    Number of Visits 10    Date for PT Re-Evaluation 03/29/21    Authorization Type [employer]    Authorization Time Period 02/15/21-03/29/21    Progress Note Due on Visit 10    PT Start Time 0759    PT Stop Time 0843    PT Time Calculation (min) 44 min    Activity Tolerance Patient tolerated treatment well;Patient limited by pain    Behavior During Therapy [facility] for tasks assessed/performed             Past Medical History:  Diagnosis Date   Arthritis    DVT (deep venous thrombosis) ([place]) 2015   pulmonary emboli   Headache    history of ileus 09/2020   History of kidney stones    Lichen sclerosus    Pneumonia 2015   PONV (postoperative nausea and vomiting)    Pulmonary emboli ([place]) 2015   Restless leg syndrome     Past Surgical History:  Procedure Laterality Date   CHOLECYSTECTOMY  1996   COLONOSCOPY  2015   CYST EXCISION Right 2010   pinkie finger   dcr Right    right eye   EXTRACORPOREAL SHOCK WAVE LITHOTRIPSY Bilateral 2006   twice on left and once on right   EXTRACORPOREAL SHOCK WAVE LITHOTRIPSY Right 07/25/2018   Procedure: EXTRACORPOREAL SHOCK WAVE LITHOTRIPSY (ESWL);  Surgeon: Billey Co, MD;  Location: [facility];  Service: Urology;  Laterality: Right;   EYE SURGERY Bilateral 2013   cataract extractions   HYSTEROSCOPY  2015   IVC FILTER INSERTION N/A 04/06/2020   Procedure: IVC FILTER INSERTION;  Surgeon: Katha Cabal, MD;  Location: [facility];  Service: Cardiovascular;  Laterality: N/A;   IVC FILTER REMOVAL  N/A 10/26/2020   Procedure: IVC FILTER REMOVAL;  Surgeon: Katha Cabal, MD;  Location: [employer] [facility];  Service: Cardiovascular;  Laterality: N/A;   IVC FILTER REMOVAL N/A 11/24/2020   Procedure: IVC FILTER REMOVAL;  Surgeon: Katha Cabal, MD;  Location: [facility];  Service: Cardiovascular;  Laterality: N/A;   kidney stone removal Right 2006, 2013   right ureteroscopic stone removals   RETINAL TEAR REPAIR CRYOTHERAPY Bilateral    RIGHT 2007 AND 2008; LEFT 2012   TONSILLECTOMY  1984   TOTAL HIP ARTHROPLASTY Right 04/08/2020   Procedure: TOTAL HIP ARTHROPLASTY ANTERIOR APPROACH;  Surgeon: Hessie Knows, MD;  Location: [facility];  Service: Orthopedics;  Laterality: Right;   TOTAL HIP ARTHROPLASTY Left 08/12/2020   Procedure: TOTAL HIP ARTHROPLASTY ANTERIOR APPROACH;  Surgeon: Hessie Knows, MD;  Location: [facility];  Service: Orthopedics;  Laterality: Left;   TUBAL LIGATION  1985    There were no vitals filed for this visit.   Subjective Assessment - 02/28/21 0801     Subjective Pt reports no pain at baseline. Has had some pain with driving. Overall had a good weekend, but had a day on Saturday her shoulder ached.    Pertinent History Savannah Wilkins is a 28yoF who comes to [place] on 1/31 for  Rt shoulder pain that began 2 years ago. Pt has both restricted arm movement and pain with use, interupted sleep at night. Pt was seen by orthopedics who noted a space occupying lesion in the [place], as well as areas of calcification, AC joint DJD, and involvement of some rotator cuff tissue. Pt was referred to orthopedic oncology who subsequently ruled the growth in shoulder as benign, recommended pt try physical therapy prior to any surgical interventions for tumor excision or other orthopedic correction. PMH: bilat THA (2022- direct anterior with Dr. Rudene Christians), kidney stones, gall stones, Rt salivary stones pending surgery in February.    Currently in Pain? No/denies             There.ex:  Seated shoulder pulleys: 2x20 in scaption R shoulder add towel squeeze 2x15x3 sec holds Supine AAROM shoulder chest press with dowel 2x15, maintained at 90 deg of forward flexion at shoulders Supine AAROM shoulder abduction with dowel: 1x3, aggravating symptoms so discontinued Seated R shoulder external rotation from belly to ~45 deg. 2x10, no pain R shoulder abduction to ~90 deg in sitting. VC's for keeping in painless ranges  Supine serratus punch: mod VC's for form/technique. 1x10, max TC's at UE throughout. VC's for keeping in pain free ranges. Standing R shoulder cable row 2x15 @ 5lb (standing on 3" plastic step) Standing bicep curl with supinated grip: 4#, 2x10  Standing GTB scarf tricep extension: 1x15   Manual Therapy:   Pt in supine for 10 min with STM to anterior and middle deltoid for pain modulation and trigger point release. Taut and concordant bands noted at proximal anterior and middle deltoid. Endorses pain relief during STM.             PT Education - 02/28/21 0756     Education Details form/technique with exercise.    Person(s) Educated Patient    Methods Explanation;Demonstration;Tactile cues;Verbal cues    Comprehension Verbalized understanding;Returned demonstration              PT Short Term Goals - 02/15/21 0925       PT SHORT TERM GOAL #1   Title After 2 weeks pt to report and demonstrate correct performance of HEP with favorable affect on symptoms.    Time 2    Period Weeks    Status New    Target Date 03/01/21      PT SHORT TERM GOAL #2   Title After 4 weeks pt to report less restrictive pain during ADL/IADL with RUE functional use.    Time 4    Period Weeks    Status New    Target Date 03/15/21               PT Long Term Goals - 02/15/21 0926       PT LONG TERM GOAL #1   Title Pt to improve FOTO score by >10 points to indicate improve perception of ability to perform ADL/IADL.    Time 4    Period Weeks     Status New    Target Date 03/15/21      PT LONG TERM GOAL #2   Title Pt to demonstrate 5/5 strength in Rt shoulder flexion, abduction, and IR without pain inhibition.    Baseline At eval: pain precludes full force production    Time 4    Period Weeks    Status New    Target Date 03/15/21  Plan - 02/28/21 0841     Clinical Impression Statement Continuing PT POC with focus on R shoulder mobility and periscapular strengthening. Beginning to transition from isometric to A/AROM and AROM. Added serratus punches for serratus anterior strengthening to assist in reaching and forward flexion strengthening. AAROM provided to keep painless due to reaching/flexion causing symptoms with AROM. Pt responindg well to bicep, tricep, and periscapular strengthening without exacerbation of symptoms. Pt still limited in AROM in gravity dependent positions due to pain. Pt will continue to benefit from skilled PT services to progress R shoulder mobility and decrease pain to return to PLOF.    Personal Factors and Comorbidities Past/Current Experience;Time since onset of injury/illness/exacerbation    Examination-Activity Limitations Bathing;Bed Mobility;Reach Overhead;Carry;Sleep;Dressing    Examination-Participation Restrictions Cleaning;Meal Prep;Driving;Laundry    Stability/Clinical Decision Making Unstable/Unpredictable    Rehab Potential Good    PT Frequency 2x / week    PT Duration 6 weeks    PT Treatment/Interventions ADLs/Self Care Home Management;Cryotherapy;Electrical Stimulation;Moist Heat;Therapeutic activities;Therapeutic exercise;Balance training;Patient/family education;Passive range of motion;Dry needling    PT Next Visit Plan TENS for pain control, advance from isometric to limited-range isokintetic loading (low to moderate)    PT Home Exercise Plan [phone] (isometric Rt shoulder ABDCT, ADD, IR, flexion, Extension, scapular retraction)    Consulted and Agree with Plan of  Care Patient             Patient will benefit from skilled therapeutic intervention in order to improve the following deficits and impairments:  Decreased range of motion, Decreased activity tolerance, Decreased strength, Postural dysfunction  Visit Diagnosis: Stiffness of right shoulder, not elsewhere classified  Chronic right shoulder pain     Problem List Patient Active Problem List   Diagnosis Date Noted   Infectious sialoadenitis of right submandibular gland 01/25/2021   Submandibular sialolithiasis 01/25/2021   Status post total hip replacement, left 08/12/2020   S/P hip replacement 04/08/2020   Restless legs 07/02/2018   Postmenopausal bleeding 07/02/2018   Lesion of vulva 07/02/2018   Nephrolithiasis 07/02/2018   Cataract 07/02/2018   Medicare annual wellness visit, initial 11/09/2017   Headache disorder 08/26/2017   Obesity (BMI 35.0-39.9 without comorbidity) 17/61/6073   Lichen sclerosus 71/06/2692   History of pulmonary embolus (PE) 07/09/2017   Chronic daily headache 07/09/2017   DVT of leg (deep venous thrombosis) ([place]) 08/17/2013   Hypoxia 08/16/2013   Pleuritic chest pain 08/15/2013   Acute pulmonary embolism ([place]) 08/15/2013   Arthropathy of pelvic region and thigh 07/03/2012   Arthritis, hip 07/03/2012    Savannah Wilkins. Fairly IV, PT, DPT Physical Therapist- [facility]  02/28/2021, 8:56 AM  [facility] [address] [place], [address] Phone: [phone]   Fax:  [phone]  Name: Savannah Wilkins MRN: [id number] Date of Birth: December 17, 1950

## 2021-03-02 ENCOUNTER — Ambulatory Visit: Payer: Medicare PPO

## 2021-03-02 ENCOUNTER — Encounter: Payer: Self-pay | Admitting: Physical Therapy

## 2021-03-02 ENCOUNTER — Other Ambulatory Visit: Payer: Self-pay

## 2021-03-02 DIAGNOSIS — M25511 Pain in right shoulder: Secondary | ICD-10-CM

## 2021-03-02 DIAGNOSIS — G8929 Other chronic pain: Secondary | ICD-10-CM

## 2021-03-02 DIAGNOSIS — M25611 Stiffness of right shoulder, not elsewhere classified: Secondary | ICD-10-CM | POA: Diagnosis not present

## 2021-03-02 NOTE — Therapy (Signed)
[facility] [address], [place], [address] Phone: [phone]   Fax:  [phone]  Physical Therapy Treatment  Patient Details  Name: Anayia Eugene MRN: [id number] Date of Birth: 1951/01/09 Referring Provider (PT): Waneta Martins, [place], [place] (Orthopedic Oncology)   Encounter Date: 03/02/2021   PT End of Session - 03/02/21 0953     Visit Number 5    Number of Visits 10    Date for PT Re-Evaluation 03/29/21    Authorization Type [employer]    Authorization Time Period 02/15/21-03/29/21    Progress Note Due on Visit 10    PT Start Time 0957    PT Stop Time 2878    PT Time Calculation (min) 42 min    Activity Tolerance Patient tolerated treatment well    Behavior During Therapy [facility] for tasks assessed/performed             Past Medical History:  Diagnosis Date   Arthritis    DVT (deep venous thrombosis) ([place]) 2015   pulmonary emboli   Headache    history of ileus 09/2020   History of kidney stones    Lichen sclerosus    Pneumonia 2015   PONV (postoperative nausea and vomiting)    Pulmonary emboli ([place]) 2015   Restless leg syndrome     Past Surgical History:  Procedure Laterality Date   CHOLECYSTECTOMY  1996   COLONOSCOPY  2015   CYST EXCISION Right 2010   pinkie finger   dcr Right    right eye   EXTRACORPOREAL SHOCK WAVE LITHOTRIPSY Bilateral 2006   twice on left and once on right   EXTRACORPOREAL SHOCK WAVE LITHOTRIPSY Right 07/25/2018   Procedure: EXTRACORPOREAL SHOCK WAVE LITHOTRIPSY (ESWL);  Surgeon: Billey Co, MD;  Location: [facility];  Service: Urology;  Laterality: Right;   EYE SURGERY Bilateral 2013   cataract extractions   HYSTEROSCOPY  2015   IVC FILTER INSERTION N/A 04/06/2020   Procedure: IVC FILTER INSERTION;  Surgeon: Katha Cabal, MD;  Location: [place] [facility];  Service: Cardiovascular;  Laterality: N/A;   IVC FILTER REMOVAL N/A 10/26/2020    Procedure: IVC FILTER REMOVAL;  Surgeon: Katha Cabal, MD;  Location: [place] [facility];  Service: Cardiovascular;  Laterality: N/A;   IVC FILTER REMOVAL N/A 11/24/2020   Procedure: IVC FILTER REMOVAL;  Surgeon: Katha Cabal, MD;  Location: [place] [facility];  Service: Cardiovascular;  Laterality: N/A;   kidney stone removal Right 2006, 2013   right ureteroscopic stone removals   RETINAL TEAR REPAIR CRYOTHERAPY Bilateral    RIGHT 2007 AND 2008; LEFT 2012   TONSILLECTOMY  1984   TOTAL HIP ARTHROPLASTY Right 04/08/2020   Procedure: TOTAL HIP ARTHROPLASTY ANTERIOR APPROACH;  Surgeon: Hessie Knows, MD;  Location: [facility];  Service: Orthopedics;  Laterality: Right;   TOTAL HIP ARTHROPLASTY Left 08/12/2020   Procedure: TOTAL HIP ARTHROPLASTY ANTERIOR APPROACH;  Surgeon: Hessie Knows, MD;  Location: [facility];  Service: Orthopedics;  Laterality: Left;   TUBAL LIGATION  1985    There were no vitals filed for this visit.   Subjective Assessment - 03/02/21 0958     Subjective pt reports no pain at rest. Minor soreness from previous session. No issues b/t last session and now.    Pertinent History Jaleeya Mcnelly is a 65yoF who comes to [place] on 1/31 for Rt shoulder pain that began 2 years ago. Pt has both  restricted arm movement and pain with use, interupted sleep at night. Pt was seen by orthopedics who noted a space occupying lesion in the [place], as well as areas of calcification, AC joint DJD, and involvement of some rotator cuff tissue. Pt was referred to orthopedic oncology who subsequently ruled the growth in shoulder as benign, recommended pt try physical therapy prior to any surgical interventions for tumor excision or other orthopedic correction. PMH: bilat THA (2022- direct anterior with Dr. Rudene Christians), kidney stones, gall stones, Rt salivary stones pending surgery in February.    Currently in Pain? No/denies             There.ex:  Seated shoulder pulleys: 2x20 in  scaption R shoulder add towel squeeze 2x15x3 sec holds Standing therex R shoulder mobility (AROM in pain free ranges):   Abduction: x10. Attains close to 90 deg before feeling discomfort.  Flexion: x10. Attains ~90 deg without pain, modified short lever (elbow flexed ~45 deg)  IR/ER in pain free ranges, belly to ~45 degrees ER with towel roll b/t elbow and thorax for improved RTC activation: 2x12, no pain reported.   R shoulder cable row 2x15 @ 5lb (standing on 3" plastic step) Bicep curl with supinated grip: 5#, 1x10, 1x7 on second set due to reports of muscular fatigue. VC's for eccentric control with descent of DB.  Standing R shoulder cable row 2x15 @ 5lb (standing on 3" plastic step) GTB scarf tricep extension: 2x15. VC's for holding TB higher up for increased resistance while maintaining same colored band.  Supine serratus punch: No VC's today, good carryover in form from previous session. TC's at UE throughout to assist in guiding protraction and retraction. Supine at 90 deg forward flexion, gentle rhythmic stabilization at R distal humerus: 3x30 sec L side lying R shoulder ER against gravity: 2x15 reps. VC's to maintain in pain free ranges and for eccentric control.   PT Education - 03/02/21 9485     Education Details form/technique with exercise.    Person(s) Educated Patient    Methods Explanation;Demonstration;Tactile cues;Verbal cues    Comprehension Verbalized understanding;Returned demonstration              PT Short Term Goals - 02/15/21 0925       PT SHORT TERM GOAL #1   Title After 2 weeks pt to report and demonstrate correct performance of HEP with favorable affect on symptoms.    Time 2    Period Weeks    Status New    Target Date 03/01/21      PT SHORT TERM GOAL #2   Title After 4 weeks pt to report less restrictive pain during ADL/IADL with RUE functional use.    Time 4    Period Weeks    Status New    Target Date 03/15/21               PT Long  Term Goals - 02/15/21 0926       PT LONG TERM GOAL #1   Title Pt to improve FOTO score by >10 points to indicate improve perception of ability to perform ADL/IADL.    Time 4    Period Weeks    Status New    Target Date 03/15/21      PT LONG TERM GOAL #2   Title Pt to demonstrate 5/5 strength in Rt shoulder flexion, abduction, and IR without pain inhibition.    Baseline At eval: pain precludes full force production    Time 4  Period Weeks    Status New    Target Date 03/15/21                   Plan - 03/02/21 1045     Clinical Impression Statement Pt tolerating progression of isometric to isokinetic R shoulder motions in available pain free ranges without aggravation of symptoms. Pt also tolerating gentle gravity resisted and rhythmic stabilization strengthening with good form/technique. Continuing with resisted bicep/tricep/periscapular strengthening as tolerated. Pt educated on contacting surgeon due to upcoming surgery for saliva gland removal on 03/15/21 for any hold on therapy or for restrictions that may be needed with pt verbalizing understanding. Pt will continue to benefit from skilled PT services to progress pain free mobility and strengthening to return to painless reaching and over head ADL tasks.    Personal Factors and Comorbidities Past/Current Experience;Time since onset of injury/illness/exacerbation    Examination-Activity Limitations Bathing;Bed Mobility;Reach Overhead;Carry;Sleep;Dressing    Examination-Participation Restrictions Cleaning;Meal Prep;Driving;Laundry    Stability/Clinical Decision Making Unstable/Unpredictable    Rehab Potential Good    PT Frequency 2x / week    PT Duration 6 weeks    PT Treatment/Interventions ADLs/Self Care Home Management;Cryotherapy;Electrical Stimulation;Moist Heat;Therapeutic activities;Therapeutic exercise;Balance training;Patient/family education;Passive range of motion;Dry needling    PT Next Visit Plan TENS for pain  control, advance from isometric to limited-range isokintetic loading (low to moderate)    PT Home Exercise Plan [phone] (isometric Rt shoulder ABDCT, ADD, IR, flexion, Extension, scapular retraction)    Consulted and Agree with Plan of Care Patient             Patient will benefit from skilled therapeutic intervention in order to improve the following deficits and impairments:  Decreased range of motion, Decreased activity tolerance, Decreased strength, Postural dysfunction  Visit Diagnosis: Stiffness of right shoulder, not elsewhere classified  Chronic right shoulder pain     Problem List Patient Active Problem List   Diagnosis Date Noted   Infectious sialoadenitis of right submandibular gland 01/25/2021   Submandibular sialolithiasis 01/25/2021   Status post total hip replacement, left 08/12/2020   S/P hip replacement 04/08/2020   Restless legs 07/02/2018   Postmenopausal bleeding 07/02/2018   Lesion of vulva 07/02/2018   Nephrolithiasis 07/02/2018   Cataract 07/02/2018   Medicare annual wellness visit, initial 11/09/2017   Headache disorder 08/26/2017   Obesity (BMI 35.0-39.9 without comorbidity) 61/60/7371   Lichen sclerosus 07/12/9483   History of pulmonary embolus (PE) 07/09/2017   Chronic daily headache 07/09/2017   DVT of leg (deep venous thrombosis) ([place]) 08/17/2013   Hypoxia 08/16/2013   Pleuritic chest pain 08/15/2013   Acute pulmonary embolism ([place]) 08/15/2013   Arthropathy of pelvic region and thigh 07/03/2012   Arthritis, hip 07/03/2012    Salem Caster. Fairly IV, PT, DPT Physical Therapist- [facility]  03/02/2021, 10:49 AM  [facility] [address], [place], [address] Phone: [phone]   Fax:  [phone]  Name: Shiah Berhow MRN: [id number] Date of Birth: [birth year]

## 2021-03-07 ENCOUNTER — Ambulatory Visit: Payer: Medicare PPO

## 2021-03-07 ENCOUNTER — Encounter: Payer: Self-pay | Admitting: Physical Therapy

## 2021-03-07 ENCOUNTER — Other Ambulatory Visit: Payer: Self-pay

## 2021-03-07 DIAGNOSIS — G8929 Other chronic pain: Secondary | ICD-10-CM

## 2021-03-07 DIAGNOSIS — M25611 Stiffness of right shoulder, not elsewhere classified: Secondary | ICD-10-CM

## 2021-03-07 NOTE — Therapy (Signed)
[facility] [address], [place], [address] Phone: [phone]   Fax:  [phone]  Physical Therapy Treatment  Patient Details  Name: Savannah Wilkins MRN: [id number] Date of Birth: 1950-02-10 Referring Provider (PT): Waneta Martins, [place] (Orthopedic Oncology)   Encounter Date: 03/07/2021   PT End of Session - 03/07/21 2993     Visit Number 6    Number of Visits 10    Date for PT Re-Evaluation 03/29/21    Authorization Type [employer]    Authorization Time Period 02/15/21-03/29/21    Progress Note Due on Visit 10    PT Start Time 0801    PT Stop Time 7169    PT Time Calculation (min) 43 min    Activity Tolerance Patient tolerated treatment well;No increased pain    Behavior During Therapy [facility] for tasks assessed/performed             Past Medical History:  Diagnosis Date   Arthritis    DVT (deep venous thrombosis) ([place]) 2015   pulmonary emboli   Headache    history of ileus 09/2020   History of kidney stones    Lichen sclerosus    Pneumonia 2015   PONV (postoperative nausea and vomiting)    Pulmonary emboli ([place]) 2015   Restless leg syndrome     Past Surgical History:  Procedure Laterality Date   CHOLECYSTECTOMY  1996   COLONOSCOPY  2015   CYST EXCISION Right 2010   pinkie finger   dcr Right    right eye   EXTRACORPOREAL SHOCK WAVE LITHOTRIPSY Bilateral 2006   twice on left and once on right   EXTRACORPOREAL SHOCK WAVE LITHOTRIPSY Right 07/25/2018   Procedure: EXTRACORPOREAL SHOCK WAVE LITHOTRIPSY (ESWL);  Surgeon: Billey Co, MD;  Location: [facility];  Service: Urology;  Laterality: Right;   EYE SURGERY Bilateral 2013   cataract extractions   HYSTEROSCOPY  2015   IVC FILTER INSERTION N/A 04/06/2020   Procedure: IVC FILTER INSERTION;  Surgeon: Katha Cabal, MD;  Location: [place] [facility];  Service: Cardiovascular;  Laterality: N/A;   IVC FILTER REMOVAL N/A  10/26/2020   Procedure: IVC FILTER REMOVAL;  Surgeon: Katha Cabal, MD;  Location: [place] [facility];  Service: Cardiovascular;  Laterality: N/A;   IVC FILTER REMOVAL N/A 11/24/2020   Procedure: IVC FILTER REMOVAL;  Surgeon: Katha Cabal, MD;  Location: [place] [facility];  Service: Cardiovascular;  Laterality: N/A;   kidney stone removal Right 2006, 2013   right ureteroscopic stone removals   RETINAL TEAR REPAIR CRYOTHERAPY Bilateral    RIGHT 2007 AND 2008; LEFT 2012   TONSILLECTOMY  1984   TOTAL HIP ARTHROPLASTY Right 04/08/2020   Procedure: TOTAL HIP ARTHROPLASTY ANTERIOR APPROACH;  Surgeon: Hessie Knows, MD;  Location: [facility];  Service: Orthopedics;  Laterality: Right;   TOTAL HIP ARTHROPLASTY Left 08/12/2020   Procedure: TOTAL HIP ARTHROPLASTY ANTERIOR APPROACH;  Surgeon: Hessie Knows, MD;  Location: [facility];  Service: Orthopedics;  Laterality: Left;   TUBAL LIGATION  1985    There were no vitals filed for this visit.   Subjective Assessment - 03/07/21 0804     Subjective Pt reports from surgeon she needs two weeks off from PT after saliva gland removal. R shoulder sore over weekend on Saturday. Reports feeling great after PT sessions but does just have a shoulder aches on days she is not here.    Pertinent History  [name] is a 72yoF who comes to [place] on 1/31 for Rt shoulder pain that began 2 years ago. Pt has both restricted arm movement and pain with use, interupted sleep at night. Pt was seen by orthopedics who noted a space occupying lesion in the [place], as well as areas of calcification, AC joint DJD, and involvement of some rotator cuff tissue. Pt was referred to orthopedic oncology who subsequently ruled the growth in shoulder as benign, recommended pt try physical therapy prior to any surgical interventions for tumor excision or other orthopedic correction. PMH: bilat THA (2022- direct anterior with Dr. Rudene Christians), kidney stones, gall stones, Rt  salivary stones pending surgery in February.    Currently in Pain? No/denies    Pain Score 0-No pain              There.ex:  Seated shoulder pulleys: 2x20 in scaption Standing therex R shoulder mobility (AROM in pain free ranges):             Abduction: 2x10. Attains close to 90 deg before feeling discomfort.            Flexion: 2x10. Attains ~90 deg without pain, modified short lever (elbow flexed ~45 deg)            IR/ER in pain free ranges, neutral to ~60 degrees ER with towel roll b/t elbow and thorax for improved RTC activation: 2x15, no pain reported.  Supine serratus punch: No VC's today, good carryover in form from previous session. 2x10, 1# DB. Supine at 90 deg forward flexion, gentle rhythmic stabilization at R distal humerus: 4x45 sec L side lying R shoulder ER against gravity: 1x15 reps. VC's to maintain in pain free ranges and for eccentric control. 1x10 with 1# DB.  Gentle wall push ups: 2x8   PT Education - 03/07/21 0807     Education Details form/technique with exercise. POC going forward.    Person(s) Educated Patient    Methods Explanation;Demonstration;Verbal cues;Tactile cues    Comprehension Verbalized understanding;Returned demonstration              PT Short Term Goals - 02/15/21 0925       PT SHORT TERM GOAL #1   Title After 2 weeks pt to report and demonstrate correct performance of HEP with favorable affect on symptoms.    Time 2    Period Weeks    Status New    Target Date 03/01/21      PT SHORT TERM GOAL #2   Title After 4 weeks pt to report less restrictive pain during ADL/IADL with RUE functional use.    Time 4    Period Weeks    Status New    Target Date 03/15/21               PT Long Term Goals - 02/15/21 0926       PT LONG TERM GOAL #1   Title Pt to improve FOTO score by >10 points to indicate improve perception of ability to perform ADL/IADL.    Time 4    Period Weeks    Status New    Target Date 03/15/21      PT  LONG TERM GOAL #2   Title Pt to demonstrate 5/5 strength in Rt shoulder flexion, abduction, and IR without pain inhibition.    Baseline At eval: pain precludes full force production    Time 4    Period Weeks    Status New  Target Date 03/15/21                   Plan - 03/07/21 0848     Clinical Impression Statement Pt progressing well tolerating further isokinetic and RTC resistance without aggravation of symptoms with an increase in volume. Discussion on updating HEP at f/u session due to pt progression. Per pt, surgeon updating pt she will reauire 2 week hiatus from therapy after her surgery on 03/15/21. Pt may benefit from reassessment of goals prior to surgery to assess progress prior to operation. Pt will continue to benefit from skilled PT services to progress R shoulder strength and mobility with reduction of pain.    Personal Factors and Comorbidities Past/Current Experience;Time since onset of injury/illness/exacerbation    Examination-Activity Limitations Bathing;Bed Mobility;Reach Overhead;Carry;Sleep;Dressing    Examination-Participation Restrictions Cleaning;Meal Prep;Driving;Laundry    Stability/Clinical Decision Making Unstable/Unpredictable    Rehab Potential Good    PT Frequency 2x / week    PT Duration 6 weeks    PT Treatment/Interventions ADLs/Self Care Home Management;Cryotherapy;Electrical Stimulation;Moist Heat;Therapeutic activities;Therapeutic exercise;Balance training;Patient/family education;Passive range of motion;Dry needling    PT Next Visit Plan Update HEP. Discuss with pt on possible reassessment of goals and update cert prior to gland removal surgery.    PT Home Exercise Plan [phone] (isometric Rt shoulder ABDCT, ADD, IR, flexion, Extension, scapular retraction)    Consulted and Agree with Plan of Care Patient             Patient will benefit from skilled therapeutic intervention in order to improve the following deficits and impairments:   Decreased range of motion, Decreased activity tolerance, Decreased strength, Postural dysfunction  Visit Diagnosis: Stiffness of right shoulder, not elsewhere classified  Chronic right shoulder pain     Problem List Patient Active Problem List   Diagnosis Date Noted   Infectious sialoadenitis of right submandibular gland 01/25/2021   Submandibular sialolithiasis 01/25/2021   Status post total hip replacement, left 08/12/2020   S/P hip replacement 04/08/2020   Restless legs 07/02/2018   Postmenopausal bleeding 07/02/2018   Lesion of vulva 07/02/2018   Nephrolithiasis 07/02/2018   Cataract 07/02/2018   Medicare annual wellness visit, initial 11/09/2017   Headache disorder 08/26/2017   Obesity (BMI 35.0-39.9 without comorbidity) 93/81/8299   Lichen sclerosus 37/16/9678   History of pulmonary embolus (PE) 07/09/2017   Chronic daily headache 07/09/2017   DVT of leg (deep venous thrombosis) ([place]) 08/17/2013   Hypoxia 08/16/2013   Pleuritic chest pain 08/15/2013   Acute pulmonary embolism ([place]) 08/15/2013   Arthropathy of pelvic region and thigh 07/03/2012   Arthritis, hip 07/03/2012    Salem Caster. Fairly IV, PT, DPT Physical Therapist- [facility]  03/07/2021, 8:52 AM  [facility] [address] [place], [address] Phone: [phone]   Fax:  [phone]  Name: Shalae Belmonte MRN: [id number] Date of Birth: Dec 14, 1950

## 2021-03-09 ENCOUNTER — Other Ambulatory Visit: Payer: Self-pay

## 2021-03-09 ENCOUNTER — Encounter
Admission: RE | Admit: 2021-03-09 | Discharge: 2021-03-09 | Disposition: A | Discharge status: Home / Self Care | Payer: Medicare PPO | Source: Ambulatory Visit | Attending: Otolaryngology | Admitting: Otolaryngology

## 2021-03-09 ENCOUNTER — Ambulatory Visit: Payer: Medicare PPO

## 2021-03-09 DIAGNOSIS — M25511 Pain in right shoulder: Secondary | ICD-10-CM

## 2021-03-09 DIAGNOSIS — M25611 Stiffness of right shoulder, not elsewhere classified: Secondary | ICD-10-CM

## 2021-03-09 DIAGNOSIS — G8929 Other chronic pain: Secondary | ICD-10-CM

## 2021-03-09 HISTORY — DX: Prediabetes: R73.03

## 2021-03-09 HISTORY — DX: Gastro-esophageal reflux disease without esophagitis: K21.9

## 2021-03-09 NOTE — Patient Instructions (Signed)
Your procedure is scheduled on:03-15-21 Tuesday Report to the Registration Desk on the 1st floor of the [facility].Then proceed to the 2nd floor Surgery Desk in the [facility] To find out your arrival time, please call [phone] between 1PM - 3PM on:03-14-21 Monday  REMEMBER: Instructions that are not followed completely may result in serious medical risk, up to and including death; or upon the discretion of your surgeon and anesthesiologist your surgery may need to be rescheduled.  Do not eat food after midnight the night before surgery.  No gum chewing, lozengers or hard candies.  You may however, drink CLEAR liquids up to 2 hours before you are scheduled to arrive for your surgery. Do not drink anything within 2 hours of your scheduled arrival time.  Clear liquids include: - water  - apple juice without pulp - gatorade (not RED colors) - black coffee or tea (Do NOT add milk or creamers to the coffee or tea) Do NOT drink anything that is not on this list.  Do NOT take any medication the day of surgery  Last dose of Aspirin was on 03-04-21 as instructed by Dr Pryor Ochoa  One week prior to surgery: Stop Anti-inflammatories (NSAIDS) such as Advil, Aleve, Ibuprofen, Motrin, Naproxen, Naprosyn and Aspirin based products such as Excedrin, Goodys Powder, BC Powder.You may however, take Tylenol if needed for pain up until the day of surgery.  Stop ANY OVER THE COUNTER supplements/vitamins until after surgery (Last dose of PRESERVISION AREDS was on 03-04-21)  No Alcohol for 24 hours before or after surgery.  No Smoking including e-cigarettes for 24 hours prior to surgery.  No chewable tobacco products for at least 6 hours prior to surgery.  No nicotine patches on the day of surgery.  Do not use any "recreational" drugs for at least a week prior to your surgery.  Please be advised that the combination of cocaine and anesthesia may have negative outcomes, up to and including death. If you  test positive for cocaine, your surgery will be cancelled.  On the morning of surgery brush your teeth with toothpaste and water, you may rinse your mouth with mouthwash if you wish. Do not swallow any toothpaste or mouthwash.  Use CHG Soap as directed on instruction sheet.  Do not wear jewelry, make-up, hairpins, clips or nail polish.  Do not wear lotions, powders, or perfumes.   Do not shave body from the neck down 48 hours prior to surgery just in case you cut yourself which could leave a site for infection.  Also, freshly shaved skin may become irritated if using the CHG soap.  Contact lenses, hearing aids and dentures may not be worn into surgery.  Do not bring valuables to the hospital. [facility] is not responsible for any missing/lost belongings or valuables.  Notify your doctor if there is any change in your medical condition (cold, fever, infection).  Wear comfortable clothing (specific to your surgery type) to the hospital.  After surgery, you can help prevent lung complications by doing breathing exercises.  Take deep breaths and cough every 1-2 hours. Your doctor may order a device called an Incentive Spirometer to help you take deep breaths. When coughing or sneezing, hold a pillow firmly against your incision with both hands. This is called splinting. Doing this helps protect your incision. It also decreases belly discomfort.  If you are being admitted to the hospital overnight, leave your suitcase in the car. After surgery it may be brought to your room.  If you are being discharged the day of surgery, you will not be allowed to drive home. You will need a responsible adult (18 years or older) to drive you home and stay with you that night.   If you are taking public transportation, you will need to have a responsible adult (18 years or older) with you. Please confirm with your physician that it is acceptable to use public transportation.   Please call the  [facility] Dept. at [phone] if you have any questions about these instructions.  Surgery Visitation Policy:  Patients undergoing a surgery or procedure may have one family member or support person with them as long as that person is not COVID-19 positive or experiencing its symptoms.  That person may remain in the waiting area during the procedure and may rotate out with other people.  Inpatient Visitation:    Visiting hours are 7 a.m. to 8 p.m. Up to two visitors ages 16+ are allowed at one time in a patient room. The visitors may rotate out with other people during the day. Visitors must check out when they leave, or other visitors will not be allowed. One designated support person may remain overnight. The visitor must pass COVID-19 screenings, use hand sanitizer when entering and exiting the patients room and wear a mask at all times, including in the patients room. Patients must also wear a mask when staff or their visitor are in the room. Masking is required regardless of vaccination status.

## 2021-03-09 NOTE — Therapy (Signed)
[facility] [address], [place], [address] Phone: [phone]   Fax:  [phone]  Physical Therapy Treatment  Patient Details  Name: Savannah Wilkins MRN: [id number] Date of Birth: Aug 06, 1950 Referring Provider (PT): Waneta Martins, [place] (Orthopedic Oncology)   Encounter Date: 03/09/2021   PT End of Session - 03/09/21 0847     Visit Number 7    Number of Visits 10    Date for PT Re-Evaluation 03/29/21    Authorization Type [employer]    Authorization Time Period 02/15/21-03/29/21    Progress Note Due on Visit 10    PT Start Time 0755    PT Stop Time 0845    PT Time Calculation (min) 50 min    Activity Tolerance Patient tolerated treatment well;No increased pain    Behavior During Therapy [facility] for tasks assessed/performed             Past Medical History:  Diagnosis Date   Arthritis    DVT (deep venous thrombosis) ([place]) 2015   pulmonary emboli   Headache    history of ileus 09/2020   History of kidney stones    Lichen sclerosus    Pneumonia 2015   PONV (postoperative nausea and vomiting)    Pulmonary emboli ([place]) 2015   Restless leg syndrome     Past Surgical History:  Procedure Laterality Date   CHOLECYSTECTOMY  1996   COLONOSCOPY  2015   CYST EXCISION Right 2010   pinkie finger   dcr Right    right eye   EXTRACORPOREAL SHOCK WAVE LITHOTRIPSY Bilateral 2006   twice on left and once on right   EXTRACORPOREAL SHOCK WAVE LITHOTRIPSY Right 07/25/2018   Procedure: EXTRACORPOREAL SHOCK WAVE LITHOTRIPSY (ESWL);  Surgeon: Billey Co, MD;  Location: [facility] OR[facility];  Service: Urology;  Laterality: Right;   EYE SURGERY Bilateral 2013   cataract extractions   HYSTEROSCOPY  2015   IVC FILTER INSERTION N/A 04/06/2020   Procedure: IVC FILTER INSERTION;  Surgeon: Katha Cabal, MD;  Location: [place] [facility];  Service: Cardiovascular;  Laterality: N/A;   IVC FILTER REMOVAL N/A  10/26/2020   Procedure: IVC FILTER REMOVAL;  Surgeon: Katha Cabal, MD;  Location: [place] [facility];  Service: Cardiovascular;  Laterality: N/A;   IVC FILTER REMOVAL N/A 11/24/2020   Procedure: IVC FILTER REMOVAL;  Surgeon: Katha Cabal, MD;  Location: [facility];  Service: Cardiovascular;  Laterality: N/A;   kidney stone removal Right 2006, 2013   right ureteroscopic stone removals   RETINAL TEAR REPAIR CRYOTHERAPY Bilateral    RIGHT 2007 AND 2008; LEFT 2012   TONSILLECTOMY  1984   TOTAL HIP ARTHROPLASTY Right 04/08/2020   Procedure: TOTAL HIP ARTHROPLASTY ANTERIOR APPROACH;  Surgeon: Hessie Knows, MD;  Location: [facility];  Service: Orthopedics;  Laterality: Right;   TOTAL HIP ARTHROPLASTY Left 08/12/2020   Procedure: TOTAL HIP ARTHROPLASTY ANTERIOR APPROACH;  Surgeon: Hessie Knows, MD;  Location: [facility];  Service: Orthopedics;  Laterality: Left;   TUBAL LIGATION  1985    There were no vitals filed for this visit.   Subjective Assessment - 03/09/21 0803     Subjective Pt conitnues to feel good on therapy days, but sore on days not here. She conitnues to modify her activities as needed, but driving remains somewhat aggravated. Pt will be here Monday then off for 2 weeks for salivary gland removal.    Pertinent History  Savannah Wilkins is a 88yoF who comes to [place] on 1/31 for Rt shoulder pain that began 2 years ago. Pt has both restricted arm movement and pain with use, interupted sleep at night. Pt was seen by orthopedics who noted a space occupying lesion in the [place], as well as areas of calcification, AC joint DJD, and involvement of some rotator cuff tissue. Pt was referred to orthopedic oncology who subsequently ruled the growth in shoulder as benign, recommended pt try physical therapy prior to any surgical interventions for tumor excision or other orthopedic correction. PMH: bilat THA (2022- direct anterior with Dr. Rudene Christians), kidney stones, gall stones, Rt  salivary stones pending surgery in February.    Currently in Pain? No/denies             INTERVENTION THIS DATE: -shoulder pulleys x3 minutes (plane ad lib)  -standing shoulder abduction 1x15 (0-90 degrees)  -standing shoulder flexion pronated 1x15 (0-90 degrees)  -Green TB row 1x15 -Green TB scraf elbow extension 1x15  -Yellow TB RUE GHJ ER 1x10 (band very long and doubled over)  -Yellow TB RUE GHJ IR 1x10 (band very long and doubled over) -Elbow flexion 1x15 @ 4lb FW  -Rt shoulder flexion 0-90 degrees, no resistance 1x15 -Rt shoulder abduction 0-90 degrees, no resistance 1x15  -standing shoulder abduction 1x15 (0-90 degrees)  -standing shoulder flexion pronated 1x15 (0-90 degrees)  -Green TB row 1x15 -Green TB scraf elbow extension 1x15  -Yellow TB RUE GHJ ER 1x10 (band very long and doubled over)  -Yellow TB RUE GHJ IR 1x10 (band very long and doubled over) -Elbow flexion 1x15 @ 4lb FW  -Rt shoulder flexion 0-90 degrees, 1lb 1x8 -Rt shoulder abduction 0-90 degrees, 1lb 1x8          PT Education - 03/09/21 0846     Education Details updated HEP from isometrics to partial range isokinetic    Person(s) Educated Patient    Methods Explanation;Handout;Verbal cues;Tactile cues;Demonstration    Comprehension Verbalized understanding;Need further instruction              PT Short Term Goals - 02/15/21 0925       PT SHORT TERM GOAL #1   Title After 2 weeks pt to report and demonstrate correct performance of HEP with favorable affect on symptoms.    Time 2    Period Weeks    Status New    Target Date 03/01/21      PT SHORT TERM GOAL #2   Title After 4 weeks pt to report less restrictive pain during ADL/IADL with RUE functional use.    Time 4    Period Weeks    Status New    Target Date 03/15/21               PT Long Term Goals - 02/15/21 0926       PT LONG TERM GOAL #1   Title Pt to improve FOTO score by >10 points to indicate improve  perception of ability to perform ADL/IADL.    Time 4    Period Weeks    Status New    Target Date 03/15/21      PT LONG TERM GOAL #2   Title Pt to demonstrate 5/5 strength in Rt shoulder flexion, abduction, and IR without pain inhibition.    Baseline At eval: pain precludes full force production    Time 4    Period Weeks    Status New    Target Date 03/15/21  Plan - 03/09/21 0847     Clinical Impression Statement Session focus on advancing HEP from isometrics to isokinetic loading in pain free ranges of Rt shoulder. Shoulder easily made sore after fatiguing activity, but pt able to find a pain free range to perform most activity. Pt progressing well in pain free ABDCT adn flexion to 90 degrees, but addition of 1lb weight today seems premature due to high levels of exertion. Will review HEP again next session as patient will be off for 2 weeks thereafter. Pt given pulleys for home performance also.    Personal Factors and Comorbidities Past/Current Experience;Time since onset of injury/illness/exacerbation    Examination-Activity Limitations Bathing;Bed Mobility;Reach Overhead;Carry;Sleep;Dressing    Examination-Participation Restrictions Cleaning;Meal Prep;Driving;Laundry    Stability/Clinical Decision Making Unstable/Unpredictable    Clinical Decision Making Moderate    Rehab Potential Good    PT Frequency 2x / week    PT Duration 6 weeks    PT Treatment/Interventions ADLs/Self Care Home Management;Cryotherapy;Electrical Stimulation;Moist Heat;Therapeutic activities;Therapeutic exercise;Balance training;Patient/family education;Passive range of motion;Dry needling    PT Next Visit Plan Review HEP updates, consider some reassessment and FOTO prior to 2 week absence    PT Home Exercise Plan upodated 2/22: pulleys, greenTB row, YTB ER, YTB IR, unresisted pain-free range fleixon and ABDCT    Consulted and Agree with Plan of Care Patient             Patient  will benefit from skilled therapeutic intervention in order to improve the following deficits and impairments:  Decreased range of motion, Decreased activity tolerance, Decreased strength, Postural dysfunction  Visit Diagnosis: Stiffness of right shoulder, not elsewhere classified  Chronic right shoulder pain     Problem List Patient Active Problem List   Diagnosis Date Noted   Infectious sialoadenitis of right submandibular gland 01/25/2021   Submandibular sialolithiasis 01/25/2021   Status post total hip replacement, left 08/12/2020   S/P hip replacement 04/08/2020   Restless legs 07/02/2018   Postmenopausal bleeding 07/02/2018   Lesion of vulva 07/02/2018   Nephrolithiasis 07/02/2018   Cataract 07/02/2018   Medicare annual wellness visit, initial 11/09/2017   Headache disorder 08/26/2017   Obesity (BMI 35.0-39.9 without comorbidity) 99/37/1696   Lichen sclerosus 78/93/8101   History of pulmonary embolus (PE) 07/09/2017   Chronic daily headache 07/09/2017   DVT of leg (deep venous thrombosis) ([place]) 08/17/2013   Hypoxia 08/16/2013   Pleuritic chest pain 08/15/2013   Acute pulmonary embolism ([place]) 08/15/2013   Arthropathy of pelvic region and thigh 07/03/2012   Arthritis, hip 07/03/2012  8:52 AM, 03/09/21 Etta Grandchild, PT, DPT Physical Therapist - [place] [phone] (Office)    [place] C, PT 03/09/2021, 8:51 AM  [facility] [address] [place], [address] Phone: [phone]   Fax:  [phone]  Name: Savannah Wilkins MRN: [id number] Date of Birth: 01-Aug-1950

## 2021-03-14 ENCOUNTER — Ambulatory Visit: Payer: Medicare PPO | Admitting: Physical Therapy

## 2021-03-14 ENCOUNTER — Other Ambulatory Visit: Payer: Self-pay

## 2021-03-14 DIAGNOSIS — G8929 Other chronic pain: Secondary | ICD-10-CM

## 2021-03-14 DIAGNOSIS — M25611 Stiffness of right shoulder, not elsewhere classified: Secondary | ICD-10-CM | POA: Diagnosis not present

## 2021-03-14 MED ORDER — FAMOTIDINE 20 MG PO TABS
20.0000 mg | ORAL_TABLET | Freq: Once | ORAL | Status: AC
Start: 2021-03-14 — End: 2021-03-15

## 2021-03-14 MED ORDER — CHLORHEXIDINE GLUCONATE 0.12 % MT SOLN: 15.0000 mL | Freq: Once | OROMUCOSAL | Status: DC

## 2021-03-14 MED ORDER — LACTATED RINGERS IV SOLN: INTRAVENOUS | Status: DC

## 2021-03-14 MED ORDER — ORAL CARE MOUTH RINSE: 15.0000 mL | Freq: Once | OROMUCOSAL | Status: DC

## 2021-03-14 NOTE — Therapy (Signed)
[facility] [address], [place], [address] Phone: [phone]   Fax:  [phone]  Physical Therapy Treatment  Patient Details  Name: Savannah Wilkins MRN: [id number] Date of Birth: 07/01/1950 Referring Provider (PT): Waneta Martins, [place] (Orthopedic Oncology)   Encounter Date: 03/14/2021   PT End of Session - 03/14/21 1352     Visit Number 8    Number of Visits 10    Date for PT Re-Evaluation 03/29/21    Authorization Type [employer]    Authorization Time Period 02/15/21-03/29/21    Progress Note Due on Visit 10    PT Start Time 1327    PT Stop Time 1413    PT Time Calculation (min) 46 min    Activity Tolerance Patient tolerated treatment well;No increased pain    Behavior During Therapy [facility] for tasks assessed/performed             Past Medical History:  Diagnosis Date   Arthritis    DVT (deep venous thrombosis) ([facility]) 2015   pulmonary emboli   GERD (gastroesophageal reflux disease)    occ   Headache    history of ileus 09/2020   History of kidney stones    Lichen sclerosus    Pneumonia 2015   PONV (postoperative nausea and vomiting)    Pre-diabetes    Pulmonary emboli ([place]) 2015   Restless leg syndrome     Past Surgical History:  Procedure Laterality Date   CATARACT EXTRACTION Bilateral 2013   CHOLECYSTECTOMY  1996   COLONOSCOPY  2015   CYST EXCISION Right 2010   pinkie finger   dcr Right    right eye   EXTRACORPOREAL SHOCK WAVE LITHOTRIPSY Bilateral 2006   twice on left and once on right   EXTRACORPOREAL SHOCK WAVE LITHOTRIPSY Right 07/25/2018   Procedure: EXTRACORPOREAL SHOCK WAVE LITHOTRIPSY (ESWL);  Surgeon: Billey Co, MD;  Location: [facility];  Service: Urology;  Laterality: Right;   EYE SURGERY Bilateral 2013   cataract extractions   HYSTEROSCOPY  2015   IVC FILTER INSERTION N/A 04/06/2020   Procedure: IVC FILTER INSERTION;  Surgeon: Katha Cabal,  MD;  Location: [place] [facility] [facility];  Service: Cardiovascular;  Laterality: N/A;   IVC FILTER REMOVAL N/A 10/26/2020   Procedure: IVC FILTER REMOVAL;  Surgeon: Katha Cabal, MD;  Location: [place] [facility];  Service: Cardiovascular;  Laterality: N/A;   IVC FILTER REMOVAL N/A 11/24/2020   Procedure: IVC FILTER REMOVAL;  Surgeon: Katha Cabal, MD;  Location: [place];  Service: Cardiovascular;  Laterality: N/A;   kidney stone removal Right 2006, 2013   right ureteroscopic stone removals   RETINAL TEAR REPAIR CRYOTHERAPY Bilateral    RIGHT 2007 AND 2008; LEFT 2012   TONSILLECTOMY  1984   TOTAL HIP ARTHROPLASTY Right 04/08/2020   Procedure: TOTAL HIP ARTHROPLASTY ANTERIOR APPROACH;  Surgeon: Hessie Knows, MD;  Location: [facility];  Service: Orthopedics;  Laterality: Right;   TOTAL HIP ARTHROPLASTY Left 08/12/2020   Procedure: TOTAL HIP ARTHROPLASTY ANTERIOR APPROACH;  Surgeon: Hessie Knows, MD;  Location: [facility];  Service: Orthopedics;  Laterality: Left;   TUBAL LIGATION  1985    There were no vitals filed for this visit.   Subjective Assessment - 03/14/21 1331     Subjective Pt reports she is feeling some soreness today upon arrival on the lateral portion of the R shoulder.  Pt is scheduled to have her salivary  gland removed tomorrow.    Pertinent History Savannah Wilkins is a 2yoF who comes to [place] on 1/31 for Rt shoulder pain that began 2 years ago. Pt has both restricted arm movement and pain with use, interupted sleep at night. Pt was seen by orthopedics who noted a space occupying lesion in the [place], as well as areas of calcification, AC joint DJD, and involvement of some rotator cuff tissue. Pt was referred to orthopedic oncology who subsequently ruled the growth in shoulder as benign, recommended pt try physical therapy prior to any surgical interventions for tumor excision or other orthopedic correction. PMH: bilat THA (2022- direct anterior with Dr.  Rudene Christians), kidney stones, gall stones, Rt salivary stones pending surgery in February.    Currently in Pain? Yes    Pain Score 1     Pain Location Shoulder    Pain Orientation Right                INTERVENTION THIS DATE: -shoulder pulleys x3 minutes (plane ad lib)   -standing shoulder abduction, YTB, 2x15 (0-90 degrees)  -standing shoulder flexion pronated, YTB, 2x15 (0-90 degrees)  -Green TB row 2x15 -Green TB shoulder extension 2x10  -Resisted RTB RUE GHJ ER walk-outs1x10  -Resisted RTB RUE GHJ ER walk-outs1x10 -Elbow flexion 1x15 @ 4lb FW  -Resisted Repeated shoulder flexion with 1# weight to simulate getting ice out of freezer, 2x10 -Given weighted pendulum/distraction as part of HEP as any form of R shoulder distraction decreased pain        PT Short Term Goals - 02/15/21 0925       PT SHORT TERM GOAL #1   Title After 2 weeks pt to report and demonstrate correct performance of HEP with favorable affect on symptoms.    Time 2    Period Weeks    Status New    Target Date 03/01/21      PT SHORT TERM GOAL #2   Title After 4 weeks pt to report less restrictive pain during ADL/IADL with RUE functional use.    Time 4    Period Weeks    Status New    Target Date 03/15/21               PT Long Term Goals - 02/15/21 0926       PT LONG TERM GOAL #1   Title Pt to improve FOTO score by >10 points to indicate improve perception of ability to perform ADL/IADL.    Time 4    Period Weeks    Status New    Target Date 03/15/21      PT LONG TERM GOAL #2   Title Pt to demonstrate 5/5 strength in Rt shoulder flexion, abduction, and IR without pain inhibition.    Baseline At eval: pain precludes full force production    Time 4    Period Weeks    Status New    Target Date 03/15/21                   Plan - 03/14/21 1356     Clinical Impression Statement Pt responded well to increased resistance during exercises today.  Pt still unsure of benefits of  therapy at this point, as she is still having discomfort with mobility and no improvmenet has been made.  Pt does note she feels stronger, but still having the pain sporadically at this time.  Pt is having surgery for salivary glad removal tomorrow and will be away  from the clinic for 2 weeks s/p surgical intervention.    Personal Factors and Comorbidities Past/Current Experience;Time since onset of injury/illness/exacerbation    Examination-Activity Limitations Bathing;Bed Mobility;Reach Overhead;Carry;Sleep;Dressing    Examination-Participation Restrictions Cleaning;Meal Prep;Driving;Laundry    Stability/Clinical Decision Making Unstable/Unpredictable    Rehab Potential Good    PT Frequency 2x / week    PT Duration 6 weeks    PT Treatment/Interventions ADLs/Self Care Home Management;Cryotherapy;Electrical Stimulation;Moist Heat;Therapeutic activities;Therapeutic exercise;Balance training;Patient/family education;Passive range of motion;Dry needling    PT Next Visit Plan Review HEP updates, consider some reassessment and FOTO prior to 2 week absence    PT Home Exercise Plan upodated 2/22: pulleys, greenTB row, YTB ER, YTB IR, unresisted pain-free range fleixon and ABDCT    Consulted and Agree with Plan of Care Patient             Patient will benefit from skilled therapeutic intervention in order to improve the following deficits and impairments:  Decreased range of motion, Decreased activity tolerance, Decreased strength, Postural dysfunction  Visit Diagnosis: Stiffness of right shoulder, not elsewhere classified  Chronic right shoulder pain     Problem List Patient Active Problem List   Diagnosis Date Noted   Infectious sialoadenitis of right submandibular gland 01/25/2021   Submandibular sialolithiasis 01/25/2021   Status post total hip replacement, left 08/12/2020   S/P hip replacement 04/08/2020   Restless legs 07/02/2018   Postmenopausal bleeding 07/02/2018   Lesion of  vulva 07/02/2018   Nephrolithiasis 07/02/2018   Cataract 07/02/2018   Medicare annual wellness visit, initial 11/09/2017   Headache disorder 08/26/2017   Obesity (BMI 35.0-39.9 without comorbidity) 90/38/3338   Lichen sclerosus 32/91/9166   History of pulmonary embolus (PE) 07/09/2017   Chronic daily headache 07/09/2017   DVT of leg (deep venous thrombosis) ([place]) 08/17/2013   Hypoxia 08/16/2013   Pleuritic chest pain 08/15/2013   Acute pulmonary embolism ([place]) 08/15/2013   Arthropathy of pelvic region and thigh 07/03/2012   Arthritis, hip 07/03/2012    [name], PT, DPT 03/14/21, 2:15 PM   [facility] [address] [place], [address] Phone: [phone]   Fax:  [phone]  Name: Savannah Wilkins MRN: [id number] Date of Birth: 10-Apr-1950

## 2021-03-15 ENCOUNTER — Encounter
Admission: RE | Disposition: A | Discharge status: Home / Self Care | Payer: Self-pay | Source: Home / Self Care | Attending: Otolaryngology

## 2021-03-15 ENCOUNTER — Other Ambulatory Visit: Payer: Self-pay

## 2021-03-15 ENCOUNTER — Encounter: Payer: Self-pay | Admitting: Otolaryngology

## 2021-03-15 ENCOUNTER — Ambulatory Visit: Payer: Medicare PPO

## 2021-03-15 ENCOUNTER — Ambulatory Visit
Admission: RE | Admit: 2021-03-15 | Discharge: 2021-03-15 | Disposition: A | Discharge status: Home / Self Care | Payer: Medicare PPO | Attending: Otolaryngology | Admitting: Otolaryngology

## 2021-03-15 DIAGNOSIS — K115 Sialolithiasis: Secondary | ICD-10-CM | POA: Diagnosis not present

## 2021-03-15 DIAGNOSIS — K1123 Chronic sialoadenitis: Secondary | ICD-10-CM | POA: Insufficient documentation

## 2021-03-15 DIAGNOSIS — Z86711 Personal history of pulmonary embolism: Secondary | ICD-10-CM | POA: Insufficient documentation

## 2021-03-15 DIAGNOSIS — K1121 Acute sialoadenitis: Secondary | ICD-10-CM | POA: Diagnosis present

## 2021-03-15 HISTORY — PX: SUBMANDIBULAR GLAND EXCISION: SHX2456

## 2021-03-15 SURGERY — EXCISION, SUBMANDIBULAR GLAND
Anesthesia: General | Laterality: Right

## 2021-03-15 MED ORDER — DEXMEDETOMIDINE (PRECEDEX) IN NS 20 MCG/5ML (4 MCG/ML) IV SYRINGE
PREFILLED_SYRINGE | INTRAVENOUS | Status: DC | PRN
  Administered 2021-03-15: 4 ug via INTRAVENOUS

## 2021-03-15 MED ORDER — FENTANYL CITRATE (PF) 100 MCG/2ML IJ SOLN: 25.0000 ug | INTRAMUSCULAR | Status: DC | PRN

## 2021-03-15 MED ORDER — SEVOFLURANE IN SOLN
RESPIRATORY_TRACT | Status: AC
  Filled 2021-03-15: qty 250

## 2021-03-15 MED ORDER — PHENYLEPHRINE HCL (PRESSORS) 10 MG/ML IV SOLN
INTRAVENOUS | Status: DC | PRN
  Administered 2021-03-15 (×3): 80 ug via INTRAVENOUS
  Administered 2021-03-15: 160 ug via INTRAVENOUS

## 2021-03-15 MED ORDER — CLINDAMYCIN PHOSPHATE 600 MG/50ML IV SOLN
INTRAVENOUS | Status: DC | PRN
  Administered 2021-03-15: 600 mg via INTRAVENOUS

## 2021-03-15 MED ORDER — PROPOFOL 500 MG/50ML IV EMUL
INTRAVENOUS | Status: DC | PRN
  Administered 2021-03-15: 50 ug/kg/min via INTRAVENOUS

## 2021-03-15 MED ORDER — ONDANSETRON HCL 4 MG/2ML IJ SOLN: 4.0000 mg | Freq: Once | INTRAMUSCULAR | Status: DC | PRN

## 2021-03-15 MED ORDER — EPHEDRINE SULFATE (PRESSORS) 50 MG/ML IJ SOLN
INTRAMUSCULAR | Status: DC | PRN
  Administered 2021-03-15 (×4): 10 mg via INTRAVENOUS
  Administered 2021-03-15: 5 mg via INTRAVENOUS

## 2021-03-15 MED ORDER — CHLORHEXIDINE GLUCONATE 0.12 % MT SOLN
OROMUCOSAL | Status: AC
  Filled 2021-03-15: qty 15

## 2021-03-15 MED ORDER — FENTANYL CITRATE (PF) 100 MCG/2ML IJ SOLN
INTRAMUSCULAR | Status: AC
  Administered 2021-03-15: 25 ug via INTRAVENOUS
  Filled 2021-03-15: qty 2

## 2021-03-15 MED ORDER — ONDANSETRON HCL 4 MG/2ML IJ SOLN
INTRAMUSCULAR | Status: AC
  Filled 2021-03-15: qty 2

## 2021-03-15 MED ORDER — PHENYLEPHRINE HCL-NACL 20-0.9 MG/250ML-% IV SOLN
INTRAVENOUS | Status: AC
  Filled 2021-03-15: qty 250

## 2021-03-15 MED ORDER — FAMOTIDINE 20 MG PO TABS
ORAL_TABLET | ORAL | Status: AC
  Administered 2021-03-15: 20 mg via ORAL
  Filled 2021-03-15: qty 1

## 2021-03-15 MED ORDER — REMIFENTANIL HCL 1 MG IV SOLR
INTRAVENOUS | Status: DC | PRN
  Administered 2021-03-15: .05 ug/kg/min via INTRAVENOUS

## 2021-03-15 MED ORDER — FENTANYL CITRATE (PF) 100 MCG/2ML IJ SOLN
INTRAMUSCULAR | Status: AC
  Filled 2021-03-15: qty 2

## 2021-03-15 MED ORDER — MIDAZOLAM HCL 2 MG/2ML IJ SOLN
INTRAMUSCULAR | Status: AC
  Filled 2021-03-15: qty 2

## 2021-03-15 MED ORDER — ACETAMINOPHEN 10 MG/ML IV SOLN
INTRAVENOUS | Status: AC
  Filled 2021-03-15: qty 100

## 2021-03-15 MED ORDER — DEXAMETHASONE SODIUM PHOSPHATE 10 MG/ML IJ SOLN
INTRAMUSCULAR | Status: DC | PRN
Start: 2021-03-15 — End: 2021-03-15
  Administered 2021-03-15: 10 mg via INTRAVENOUS

## 2021-03-15 MED ORDER — LIDOCAINE HCL (CARDIAC) PF 100 MG/5ML IV SOSY
PREFILLED_SYRINGE | INTRAVENOUS | Status: DC | PRN
  Administered 2021-03-15: 100 mg via INTRAVENOUS

## 2021-03-15 MED ORDER — HYDROCODONE-ACETAMINOPHEN 5-325 MG PO TABS: 1.0000 | ORAL_TABLET | ORAL | 0 refills | Status: AC | PRN

## 2021-03-15 MED ORDER — REMIFENTANIL HCL 1 MG IV SOLR
INTRAVENOUS | Status: AC
  Filled 2021-03-15: qty 1000

## 2021-03-15 MED ORDER — LIDOCAINE HCL (PF) 2 % IJ SOLN
INTRAMUSCULAR | Status: AC
  Filled 2021-03-15: qty 5

## 2021-03-15 MED ORDER — ROCURONIUM BROMIDE 10 MG/ML (PF) SYRINGE
PREFILLED_SYRINGE | INTRAVENOUS | Status: AC
  Filled 2021-03-15: qty 10

## 2021-03-15 MED ORDER — DEXAMETHASONE SODIUM PHOSPHATE 10 MG/ML IJ SOLN
INTRAMUSCULAR | Status: AC
  Filled 2021-03-15: qty 1

## 2021-03-15 MED ORDER — DEXMEDETOMIDINE (PRECEDEX) IN NS 20 MCG/5ML (4 MCG/ML) IV SYRINGE
PREFILLED_SYRINGE | INTRAVENOUS | Status: AC
  Filled 2021-03-15: qty 5

## 2021-03-15 MED ORDER — CLINDAMYCIN PHOSPHATE 600 MG/50ML IV SOLN
INTRAVENOUS | Status: AC
  Filled 2021-03-15: qty 50

## 2021-03-15 MED ORDER — PROPOFOL 500 MG/50ML IV EMUL
INTRAVENOUS | Status: AC
  Filled 2021-03-15: qty 50

## 2021-03-15 MED ORDER — APREPITANT 40 MG PO CAPS
ORAL_CAPSULE | ORAL | Status: AC
Start: 2021-03-15 — End: 2021-03-15
  Administered 2021-03-15: 40 mg
  Filled 2021-03-15: qty 1

## 2021-03-15 MED ORDER — ACETAMINOPHEN 10 MG/ML IV SOLN
INTRAVENOUS | Status: DC | PRN
  Administered 2021-03-15: 1000 mg via INTRAVENOUS

## 2021-03-15 MED ORDER — SUCCINYLCHOLINE CHLORIDE 200 MG/10ML IV SOSY
PREFILLED_SYRINGE | INTRAVENOUS | Status: DC | PRN
  Administered 2021-03-15: 100 mg via INTRAVENOUS

## 2021-03-15 MED ORDER — PROPOFOL 10 MG/ML IV BOLUS
INTRAVENOUS | Status: DC | PRN
  Administered 2021-03-15: 50 mg via INTRAVENOUS
  Administered 2021-03-15: 150 mg via INTRAVENOUS

## 2021-03-15 MED ORDER — FENTANYL CITRATE (PF) 100 MCG/2ML IJ SOLN
INTRAMUSCULAR | Status: DC | PRN
  Administered 2021-03-15: 50 ug via INTRAVENOUS

## 2021-03-15 MED ORDER — ONDANSETRON HCL 4 MG/2ML IJ SOLN
INTRAMUSCULAR | Status: DC | PRN
Start: 2021-03-15 — End: 2021-03-15
  Administered 2021-03-15: 4 mg via INTRAVENOUS

## 2021-03-15 MED ORDER — PROPOFOL 10 MG/ML IV BOLUS
INTRAVENOUS | Status: AC
  Filled 2021-03-15: qty 40

## 2021-03-15 MED ORDER — LIDOCAINE-EPINEPHRINE 1 %-1:100000 IJ SOLN
INTRAMUSCULAR | Status: AC
Start: 2021-03-15 — End: ?
  Filled 2021-03-15: qty 1

## 2021-03-15 MED ORDER — ONDANSETRON HCL 4 MG PO TABS: 4.0000 mg | ORAL_TABLET | Freq: Three times a day (TID) | ORAL | 0 refills | Status: AC | PRN

## 2021-03-15 MED ORDER — PHENYLEPHRINE 40 MCG/ML (10ML) SYRINGE FOR IV PUSH (FOR BLOOD PRESSURE SUPPORT)
PREFILLED_SYRINGE | INTRAVENOUS | Status: DC | PRN
Start: 2021-03-15 — End: 2021-03-15

## 2021-03-15 MED ORDER — LIDOCAINE-EPINEPHRINE 1 %-1:100000 IJ SOLN
INTRAMUSCULAR | Status: DC | PRN
  Administered 2021-03-15: 5 mL

## 2021-03-15 SURGICAL SUPPLY — 49 items
ADH SKN CLS APL DERMABOND .7 (GAUZE/BANDAGES/DRESSINGS)
BLADE SURG 15 STRL LF DISP TIS (BLADE) ×1 IMPLANT
BLADE SURG 15 STRL SS (BLADE) ×2
BULB RESERV EVAC DRAIN JP 100C (MISCELLANEOUS) IMPLANT
CNTNR SPEC 2.5X3XGRAD LEK (MISCELLANEOUS)
CONT SPEC 4OZ STER OR WHT (MISCELLANEOUS)
CONT SPEC 4OZ STRL OR WHT (MISCELLANEOUS)
CONTAINER SPEC 2.5X3XGRAD LEK (MISCELLANEOUS) IMPLANT
CORD BIP STRL DISP 12FT (MISCELLANEOUS) ×2 IMPLANT
DERMABOND ADVANCED (GAUZE/BANDAGES/DRESSINGS)
DERMABOND ADVANCED .7 DNX12 (GAUZE/BANDAGES/DRESSINGS) IMPLANT
DRAIN JP 10F RND SILICONE (MISCELLANEOUS) IMPLANT
DRAPE MAG INST 16X20 L/F (DRAPES) ×2 IMPLANT
DRSG TEGADERM 2-3/8X2-3/4 SM (GAUZE/BANDAGES/DRESSINGS) IMPLANT
ELECT EMG 20MM DUAL (MISCELLANEOUS) ×2
ELECT NEEDLE 20X.3 GREEN (MISCELLANEOUS)
ELECT REM PT RETURN 9FT ADLT (ELECTROSURGICAL) ×2
ELECTRODE EMG 20MM DUAL (MISCELLANEOUS) IMPLANT
ELECTRODE NDL 20X.3 GREEN (MISCELLANEOUS) IMPLANT
ELECTRODE NEEDLE 20X.3 GREEN (MISCELLANEOUS) IMPLANT
ELECTRODE REM PT RTRN 9FT ADLT (ELECTROSURGICAL) ×1 IMPLANT
FORCEPS JEWEL BIP 4-3/4 STR (INSTRUMENTS) ×2 IMPLANT
GAUZE 4X4 16PLY ~~LOC~~+RFID DBL (SPONGE) ×2 IMPLANT
GLOVE SURG ENC MOIS LTX SZ7.5 (GLOVE) ×4 IMPLANT
GOWN STRL REUS W/ TWL LRG LVL3 (GOWN DISPOSABLE) ×3 IMPLANT
GOWN STRL REUS W/TWL LRG LVL3 (GOWN DISPOSABLE) ×6
HEMOSTAT SURGICEL 2X3 (HEMOSTASIS) ×2 IMPLANT
HOOK STAY BLUNT/RETRACTOR 5M (MISCELLANEOUS) IMPLANT
LABEL OR SOLS (LABEL) ×2 IMPLANT
MANIFOLD NEPTUNE II (INSTRUMENTS) ×2 IMPLANT
NS IRRIG 500ML POUR BTL (IV SOLUTION) ×2 IMPLANT
PACK HEAD/NECK (MISCELLANEOUS) ×2 IMPLANT
PROBE MONO 100X0.75 ELECT 1.9M (MISCELLANEOUS) IMPLANT
PROBE NEUROSIGN BIPOL (MISCELLANEOUS) IMPLANT
PROBE NEUROSIGN BIPOLAR (MISCELLANEOUS) ×2
SHEARS HARMONIC 9CM CVD (BLADE) ×2 IMPLANT
SPONGE KITTNER 5P (MISCELLANEOUS) ×5 IMPLANT
STAPLER SKIN PROX 35W (STAPLE) IMPLANT
STRIP CLOSURE SKIN 1/4X4 (GAUZE/BANDAGES/DRESSINGS) ×1 IMPLANT
SUT ETHILON 4-0 (SUTURE)
SUT ETHILON 4-0 FS2 18XMFL BLK (SUTURE)
SUT PROLENE 6 0 P 1 18 (SUTURE) ×2 IMPLANT
SUT SILK 2 0 (SUTURE)
SUT SILK 2 0 SH (SUTURE) ×1 IMPLANT
SUT SILK 2-0 18XBRD TIE 12 (SUTURE) ×1 IMPLANT
SUT VIC AB 4-0 RB1 18 (SUTURE) ×2 IMPLANT
SUTURE ETHLN 4-0 FS2 18XMF BLK (SUTURE) ×1 IMPLANT
SYSTEM CHEST DRAIN TLS 7FR (DRAIN) IMPLANT
WATER STERILE IRR 500ML POUR (IV SOLUTION) ×1 IMPLANT

## 2021-03-15 NOTE — Op Note (Signed)
..03/15/2021  9:18 AM    Savannah Wilkins  [id number]   Pre-Op Dx: Sialoadenitis, acute recurrent of submandibular gland, sialolithiasis  Post-op Dx: same  Proc: Right Submandibular Gland Excision  Surg: Savannah Wilkins  Asst:  McQueen  Anes:  GOT  EBL: 83ml  Comp:  none  Findings:  Firm fibrotic gland with scaring.  Lingual nerve identified and preserved.  Facial nerve monitoring used throughout with good stimulation of marginal mandibular nerve.  Multiple large sialoliths.  Procedure:  After the patient was identified in hold and the history and physical and consent was reviewed and updated.  The patient was marked in the normal fashion in an existing skin crease of his right neck just below the level of the submandibular gland approximately 2 finger lengths below the body of the mandible.  The patient was next taken to the operating room and placed in a supine position.  General endotracheal anesthesia was induced in the normal fashion.  The patient's right neck was neck injected with 5c's of 1% lidocaine with 1:100,000 Epinephrine.  The patient was next prepped and draped in a sterile normal fashion.  The facial nerve monitor was set up in the usual fashion for monitoring of the marginal mandibular branch of the facial nerve.  At this time, a 15 blade scalpel was used to make a skin incision along the previously marked neck crease in the right neck after the proper time out was performed.  Dissection was carefully performed through the subcutaneous tissues with combination of Bovie electrocautery and blunt dissection.  The platysma muscle was divided with harmonic scalpel.  A nerve stimulator was used to ensure no injury to the marginal mandibular nerve was made.  Blunt dissection inferiorly along the inferior edge of the submandibular gland was performed.  The fascia overlying the gland was divided and retracted superiorly with a Sin retractor.  This demonstrated significant scar  tissue of the fibrotic gland to the surrounding tissues.  Mostly at posterior superior aspect of the gland.  Continued blunt and sharp dissection with Harmonic Scalpel was performed circumferentially around the gland.  The facial arter, was noted posteriorly and was divided with combination of Harmonic scalpel and 2.0 suture tie.  The hypoglossal nerve deep to the digastric triangle was identified and preserved.  The Lingual nerve along the superior aspect of the gland and duct was identified.  This was moderately fibrotically attached to a dilated Wharton's duct.  The submandibular ganglion was divided with Harmonic Scalpel with care taken to avoid injury to the Lingual nerve.  The remaining attachments of the gland deeply were next carefully transected with the Harmonic scalpel again with taking care to avoid injury to the hypoglossal and lingual nerves.  A separate branch of the facial artery was also divided deep and on the posterior aspect of the gland.  At this time, with the gland pedicled on a dilated Wharton's duct and the duct was crossed with a right angle clamp and then divided with Harmonic scalpel and tied with a 2.0 Silk suture.  Further manipulation of the wound failed to reveal any additional stones.  At this time, the wound was copiously irrigated with sterile saline.  Meticulous hemostasis with bipolar was obtained.  Surgicel was placed in the wound bed.  The wound was then closed in a multilayered fashion with vicryl for subcutaneous tissues and Dermabond for skin closure.  At this time the patient was extubated and taken to PACU in good condition.    Dispo:  PACU in good condition.  Plan:  Ice, elevation, analgesia.  Carloyn Manner 03/15/2021 9:18 AM

## 2021-03-15 NOTE — Progress Notes (Signed)
°   03/15/21 0700  Clinical Encounter Type  Visited With Patient  Visit Type Pre-op  Spiritual Encounters  Spiritual Needs Prayer   Chaplain provided pre-op support through compassionate presence, conversation and prayer

## 2021-03-15 NOTE — H&P (Signed)
..  History and Physical paper copy reviewed and updated date of procedure and will be scanned into system.  Patient seen and examined.  H&P noted in faxed records to [facility] pre-op on 2/20 dated 2/17.  IV clindamycin 600mg  x1 ordered for pre-operative treatment given history of implants.

## 2021-03-15 NOTE — Anesthesia Preprocedure Evaluation (Signed)
Anesthesia Evaluation  Patient identified by MRN, date of birth, ID band Patient awake    Reviewed: Allergy & Precautions, NPO status , Patient's Chart, lab work & pertinent test results  History of Anesthesia Complications (+) PONV and history of anesthetic complications  Airway Mallampati: II  TM Distance: >3 FB Neck ROM: Full    Dental  (+) Teeth Intact, Caps, Dental Advidsory Given   Pulmonary neg pulmonary ROS, neg sleep apnea, neg COPD, Patient abstained from smoking.Not current smoker, PE   Pulmonary exam normal breath sounds clear to auscultation       Cardiovascular Exercise Tolerance: Good METS(-) hypertension(-) angina+ DVT  (-) CAD and (-) Past MI (-) dysrhythmias  Rhythm:Regular Rate:Normal - Systolic murmurs    Neuro/Psych  Headaches, negative psych ROS   GI/Hepatic neg GERD  ,(+)     (-) substance abuse  ,   Endo/Other  neg diabetes  Renal/GU Renal disease (kidney stones)     Musculoskeletal   Abdominal   Peds  Hematology   Anesthesia Other Findings Past Medical History: No date: Arthritis 2015: DVT (deep venous thrombosis) (HCC)     Comment:  pulmonary emboli No date: Headache 09/2020: history of ileus No date: History of kidney stones No date: Lichen sclerosus 1610: Pneumonia No date: PONV (postoperative nausea and vomiting) 2015: Pulmonary emboli (HCC) No date: Restless leg syndrome  Reproductive/Obstetrics                             Anesthesia Physical  Anesthesia Plan  ASA: 3  Anesthesia Plan: General   Post-op Pain Management:    Induction: Intravenous  PONV Risk Score and Plan: 4 or greater and Ondansetron, Dexamethasone, Midazolam, TIVA and Propofol infusion  Airway Management Planned: Oral ETT  Additional Equipment: None  Intra-op Plan:   Post-operative Plan: Extubation in OR  Informed Consent: I have reviewed the patients History and  Physical, chart, labs and discussed the procedure including the risks, benefits and alternatives for the proposed anesthesia with the patient or authorized representative who has indicated his/her understanding and acceptance.     Dental advisory given  Plan Discussed with: CRNA and Surgeon  Anesthesia Plan Comments: (Will start with natural airway + prop gtt, airway intervention available if indicated for surgical reasons. Explained this to patient and she is in agreement. Discussed risks of anesthesia with patient, including PONV, sore throat, lip/dental damage. Rare risks discussed as well, such as cardiorespiratory and neurological sequelae, and allergic reactions. Discussed the role of CRNA in patient's perioperative care. Patient understands.)        Anesthesia Quick Evaluation

## 2021-03-15 NOTE — Anesthesia Procedure Notes (Signed)
Procedure Name: Intubation Date/Time: 03/15/2021 7:37 AM Performed by: Tollie Eth, CRNA Pre-anesthesia Checklist: Patient identified, Patient being monitored, Timeout performed, Emergency Drugs available and Suction available Patient Re-evaluated:Patient Re-evaluated prior to induction Oxygen Delivery Method: Circle system utilized Preoxygenation: Pre-oxygenation with 100% oxygen Induction Type: IV induction Ventilation: Mask ventilation without difficulty Laryngoscope Size: 3 and Mac Grade View: Grade I Tube type: Oral Tube size: 7.0 mm Number of attempts: 1 Airway Equipment and Method: Stylet and Video-laryngoscopy Placement Confirmation: ETT inserted through vocal cords under direct vision, positive ETCO2 and breath sounds checked- equal and bilateral Secured at: 21 cm Tube secured with: Tape Dental Injury: Teeth and Oropharynx as per pre-operative assessment

## 2021-03-15 NOTE — Transfer of Care (Signed)
Immediate Anesthesia Transfer of Care Note  Patient: Savannah Wilkins  Procedure(s) Performed: EXCISION SUBMANDIBULAR GLAND (Right)  Patient Location: PACU  Anesthesia Type:General  Level of Consciousness: drowsy and responds to stimulation  Airway & Oxygen Therapy: Patient Spontanous Breathing and Patient connected to face mask oxygen  Post-op Assessment: Report given to RN and Post -op Vital signs reviewed and stable  Post vital signs: Reviewed and stable  Last Vitals:  Vitals Value Taken Time  BP 157/85 03/15/21 0934  Temp    Pulse 102 03/15/21 0936  Resp 17 03/15/21 0936  SpO2 99 % 03/15/21 0936  Vitals shown include unvalidated device data.  Last Pain:  Vitals:   03/15/21 0626  TempSrc: Oral  PainSc: 1          Complications: No notable events documented.

## 2021-03-15 NOTE — Discharge Instructions (Signed)

## 2021-03-15 NOTE — Anesthesia Postprocedure Evaluation (Signed)
Anesthesia Post Note  Patient: Savannah Wilkins  Procedure(s) Performed: EXCISION SUBMANDIBULAR GLAND (Right)  Patient location during evaluation: PACU Anesthesia Type: General Level of consciousness: awake and alert Pain management: pain level controlled Vital Signs Assessment: post-procedure vital signs reviewed and stable Respiratory status: spontaneous breathing, nonlabored ventilation, respiratory function stable and patient connected to nasal cannula oxygen Cardiovascular status: blood pressure returned to baseline and stable Postop Assessment: no apparent nausea or vomiting Anesthetic complications: no   No notable events documented.   Last Vitals:  Vitals:   03/15/21 1030 03/15/21 1046  BP: (!) 166/87 (!) 165/90  Pulse: 90 90  Resp: 16 14  Temp: (!) 36.4 C (!) 36.1 C  SpO2: 94% 95%    Last Pain:  Vitals:   03/15/21 1046  TempSrc: Temporal  PainSc: 4                  Martha Clan

## 2021-03-16 ENCOUNTER — Encounter: Payer: Medicare PPO | Admitting: Physical Therapy

## 2021-03-16 LAB — SURGICAL PATHOLOGY

## 2021-03-21 ENCOUNTER — Encounter: Payer: Medicare PPO | Admitting: Physical Therapy

## 2021-03-23 ENCOUNTER — Encounter: Payer: Medicare PPO | Admitting: Physical Therapy

## 2021-03-28 ENCOUNTER — Encounter: Payer: Medicare PPO | Admitting: Physical Therapy

## 2021-03-30 ENCOUNTER — Ambulatory Visit: Payer: Medicare PPO | Attending: Orthopedic Surgery | Admitting: Physical Therapy

## 2021-03-30 ENCOUNTER — Other Ambulatory Visit: Payer: Self-pay

## 2021-03-30 DIAGNOSIS — M25611 Stiffness of right shoulder, not elsewhere classified: Secondary | ICD-10-CM | POA: Diagnosis not present

## 2021-03-30 DIAGNOSIS — M25511 Pain in right shoulder: Secondary | ICD-10-CM | POA: Diagnosis present

## 2021-03-30 DIAGNOSIS — G8929 Other chronic pain: Secondary | ICD-10-CM | POA: Insufficient documentation

## 2021-03-30 NOTE — Therapy (Signed)
[facility] ?[facility] ?[address] ?[place], [place], [address] ?Phone: [phone]   Fax:  [phone] ? ?Physical Therapy Treatment/ Discharge Summary  ? ? ?Dates of reporting period: 02/15/21- 03/30/21 ? ?Patient Details  ?Name: Savannah Wilkins ?MRN: [id number] ?Date of Birth: May 09, 1950 ?Referring Provider (PT): Waneta Martins, [place] (Orthopedic Oncology) ? ? ?Encounter Date: 03/30/2021 ? ? PT End of Session - 03/30/21 0846   ? ? Visit Number 9   ? Number of Visits 10   ? Date for PT Re-Evaluation 03/29/21   ? Authorization Type [employer]   ? Authorization Time Period 02/15/21-03/29/21   ? Progress Note Due on Visit 10   ? PT Start Time 0805   ? PT Stop Time 0845   ? PT Time Calculation (min) 40 min   ? Activity Tolerance Patient tolerated treatment well;No increased pain   ? Behavior During Therapy [facility] for tasks assessed/performed   ? ?  ?  ? ?  ? ? ?Past Medical History:  ?Diagnosis Date  ? Arthritis   ? DVT (deep venous thrombosis) ([place]) 2015  ? pulmonary emboli  ? GERD (gastroesophageal reflux disease)   ? occ  ? Headache   ? history of ileus 09/2020  ? History of kidney stones   ? Lichen sclerosus   ? Pneumonia 2015  ? PONV (postoperative nausea and vomiting)   ? Pre-diabetes   ? Pulmonary emboli ([place]) 2015  ? Restless leg syndrome   ? ? ?Past Surgical History:  ?Procedure Laterality Date  ? CATARACT EXTRACTION Bilateral 2013  ? CHOLECYSTECTOMY  1996  ? COLONOSCOPY  2015  ? CYST EXCISION Right 2010  ? pinkie finger  ? dcr Right   ? right eye  ? EXTRACORPOREAL SHOCK WAVE LITHOTRIPSY Bilateral 2006  ? twice on left and once on right  ? EXTRACORPOREAL SHOCK WAVE LITHOTRIPSY Right 07/25/2018  ? Procedure: EXTRACORPOREAL SHOCK WAVE LITHOTRIPSY (ESWL);  Surgeon: Billey Co, MD;  Location: [facility];  Service: Urology;  Laterality: Right;  ? EYE SURGERY Bilateral 2013  ? cataract extractions  ? HYSTEROSCOPY  2015  ? IVC FILTER INSERTION N/A 04/06/2020   ? Procedure: IVC FILTER INSERTION;  Surgeon: Katha Cabal, MD;  Location: [facility];  Service: Cardiovascular;  Laterality: N/A;  ? IVC FILTER REMOVAL N/A 10/26/2020  ? Procedure: IVC FILTER REMOVAL;  Surgeon: Katha Cabal, MD;  Location: [facility];  Service: Cardiovascular;  Laterality: N/A;  ? IVC FILTER REMOVAL N/A 11/24/2020  ? Procedure: IVC FILTER REMOVAL;  Surgeon: Katha Cabal, MD;  Location: [facility];  Service: Cardiovascular;  Laterality: N/A;  ? kidney stone removal Right 2006, 2013  ? right ureteroscopic stone removals  ? RETINAL TEAR REPAIR CRYOTHERAPY Bilateral   ? RIGHT 2007 AND 2008; LEFT 2012  ? SUBMANDIBULAR GLAND EXCISION Right 03/15/2021  ? Procedure: EXCISION SUBMANDIBULAR GLAND;  Surgeon: Carloyn Manner, MD;  Location: [facility];  Service: ENT;  Laterality: Right;  ? TONSILLECTOMY  1984  ? TOTAL HIP ARTHROPLASTY Right 04/08/2020  ? Procedure: TOTAL HIP ARTHROPLASTY ANTERIOR APPROACH;  Surgeon: Hessie Knows, MD;  Location: [facility];  Service: Orthopedics;  Laterality: Right;  ? TOTAL HIP ARTHROPLASTY Left 08/12/2020  ? Procedure: TOTAL HIP ARTHROPLASTY ANTERIOR APPROACH;  Surgeon: Hessie Knows, MD;  Location: [facility];  Service: Orthopedics;  Laterality: Left;  ? TUBAL LIGATION  1985  ? ? ?There were no vitals filed for this  visit. ? ? Subjective Assessment - 03/30/21 0817   ? ? 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 Her physician has stated that she should only lift 5 lbs or less. She continues to experience pain when reaching overhead and would like to pursue further options with orthopedic oncologist.   ? Pertinent History Savannah Wilkins is a 52yoF who comes to [facility] on 1/31 for Rt shoulder pain that began 2 years ago. Pt has both restricted arm movement and pain with use, interupted sleep at night. Pt was seen by orthopedics who noted a space occupying lesion in the [place],  as well as areas of calcification, AC joint DJD, and involvement of some rotator cuff tissue. Pt was referred to orthopedic oncology who subsequently ruled the growth in shoulder as benign, recommended pt try physical therapy prior to any surgical interventions for tumor excision or other orthopedic correction. PMH: bilat THA (2022- direct anterior with Dr. Rudene Christians), kidney stones, gall stones, Rt salivary stones pending surgery in February.   ? How long can you sit comfortably? Not related   ? How long can you stand comfortably? Not related   ? How long can you walk comfortably? Not related   ? Diagnostic tests Rt shoulder CT 1/25: IMPRESSION:1. The right suprascapular mass apparently has 2 components including a large fat attenuation component and an area of either partial ossification or adjacent osseous excrescence extending from the coracoid to contact the undersurface of the clavicle. Differential considerations include posttraumatic heterotopic ossification, osteochondroma with coincident lipoma, or partial ossification of a large lipoma. No significant soft tissue attenuation component is identified. 2. No evidence of acute fracture or acute dislocation of the right shoulder. There is chronic abnormality of the right AC joint as described above. FINDINGS: The right clavicle is abnormally elevated relative to the right  acromion but does contact a minimal portion of the right acromial articular  surface?large fat density mass superior to the supraspinatus muscle belly. Associated with this fat attenuation mass, there is a large area of heterotopic ossification extending superiorly from the coracoid process to contact the undersurface of the clavicle and portions of the acromion?could represent posttraumatic heterotopic ossification or an unusual osteochondroma?An unusual lipoma with partial ossification is somewhat favored?There are degenerative changes of the greater tuberosity, likely  related to the rotator  cuff insertion. The supraspinatus and infraspinatus  tendons appear generally intact. Teres minor tendon appears generally  intact. portions of the subscapularis tendon are likely intact. No  definitive osseous erosions are identified. There is prominent fat inferior  to the right clavicle as seen on the previous MRI.   ? Patient Stated Goals avoid unnecessary surgery, improve sleep quality, return to pain free use of arm in ADL/IADL   ? Currently in Pain? Yes   ? Pain Score 1    ? Pain Orientation Right   ? Pain Descriptors / Indicators Discomfort   ? Pain Type Chronic pain   ? ?  ?  ? ?  ? ? ?[place]: ? ?Shoulder Flexion Pulleys AAROM 3 x 10  ?Shoulder Abduction Pulleys AAROM 3 x 10  ? ?Shoulder Flexion R/L  5/5  ?Shoulder Abduction R/L 5/5  ?Shoulder IR  R/L 5/5  ? ?Reviewed HEP and demonstrated progressions to patient.  ? ?Discussed POC options for patient given limited progress with pain.  ? ? ? ? ? ? ? ? ? ? PT Education - 03/30/21 0858   ? ?  Education Details form and technique for appropriate exercise   ? Person(s) Educated Patient   ? Methods Explanation   ? Comprehension Verbalized understanding;Returned demonstration;Verbal cues required   ? ?  ?  ? ?  ? ? ? PT Short Term Goals - 03/30/21 0853   ? ?  ? PT SHORT TERM GOAL #1  ? Title After 2 weeks pt to report and demonstrate correct performance of HEP with favorable affect on symptoms.   ? Time 2   ? Period Weeks   ? Status Achieved   ? Target Date 03/01/21   ?  ? PT SHORT TERM GOAL #2  ? Title After 4 weeks pt to report less restrictive pain during ADL/IADL with RUE functional use.   ? Baseline 03/30/21: Still feeling pain with reaching overhead to put ice in fridge   ? Time 4   ? Period Weeks   ? Status Achieved   ? Target Date 03/15/21   ? ?  ?  ? ?  ? ? ? ? PT Long Term Goals - 03/30/21 0855   ? ?  ? PT LONG TERM GOAL #1  ? Title Pt to improve FOTO score by >10 points to indicate improve perception of ability to perform ADL/IADL.   ? Baseline  Initial:55/65  3/15: 50/65   ? Time 4   ? Period Weeks   ? Status Not Met   ? Target Date 03/15/21   ?  ? PT LONG TERM GOAL #2  ? Title Pt to demonstrate 5/5 strength in Rt shoulder flexion, abduction, and IR

## 2021-04-06 ENCOUNTER — Encounter: Payer: Medicare PPO | Admitting: Physical Therapy

## 2021-04-11 ENCOUNTER — Encounter: Payer: Medicare PPO | Admitting: Physical Therapy

## 2021-04-13 ENCOUNTER — Encounter: Payer: Medicare PPO | Admitting: Physical Therapy

## 2021-05-26 IMAGING — CT CT RENAL STONE PROTOCOL
1 of 2 series · 14 of 32 positions shown, 18 images · non-contrast
Comparison: 07/03/2018 abdominal radiograph.

CLINICAL DATA: History of nephrolithiasis and lithotripsy,
presenting for follow-up. Possible right ureteral stone on recent
radiograph.

EXAM:
CT ABDOMEN AND PELVIS WITHOUT CONTRAST
TECHNIQUE: Multidetector CT imaging of the abdomen and pelvis was performed
following the standard protocol without IV contrast.

[Series 2: axial st · axial · 0.77mm/px · z∈[-943,-503]mm · 14 of 97 slices shown, 18 images]
[im 5/97  soft-tissue]
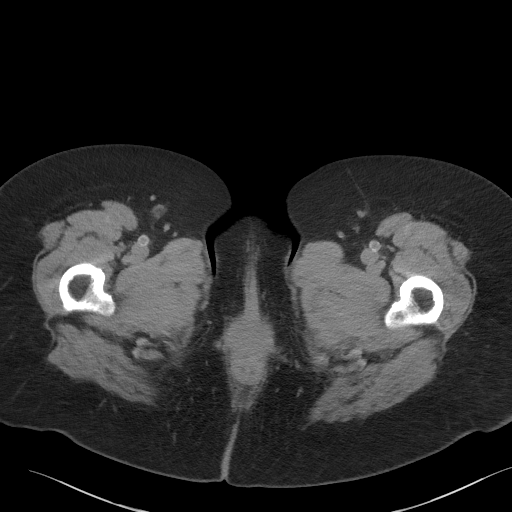
[im 5/97  bone]
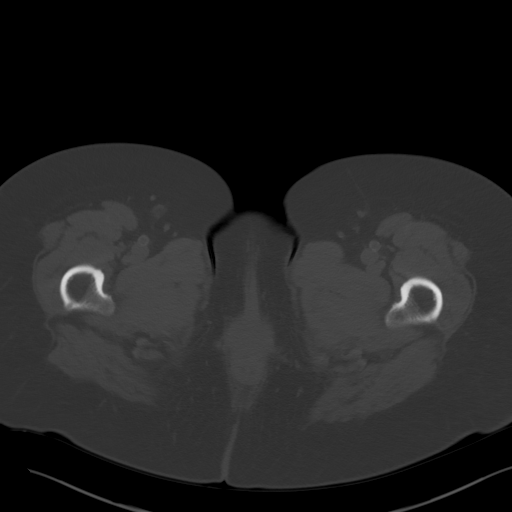
[im 13/97  soft-tissue]
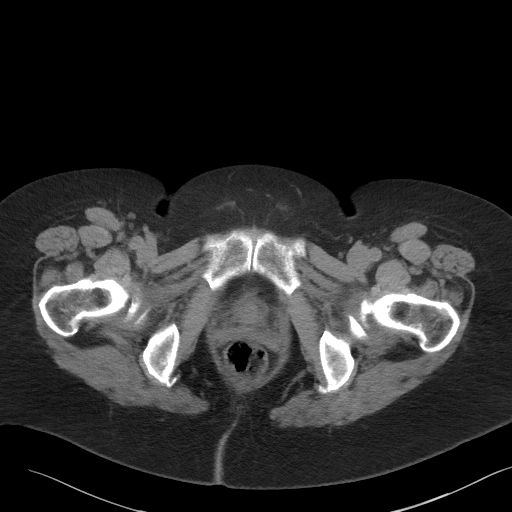
[im 21/97  soft-tissue]
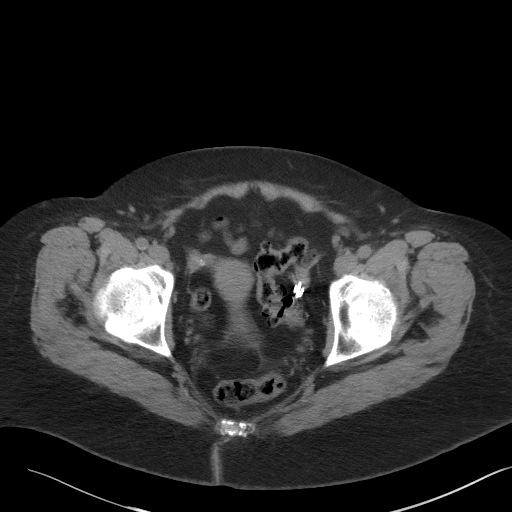
[im 29/97  soft-tissue]
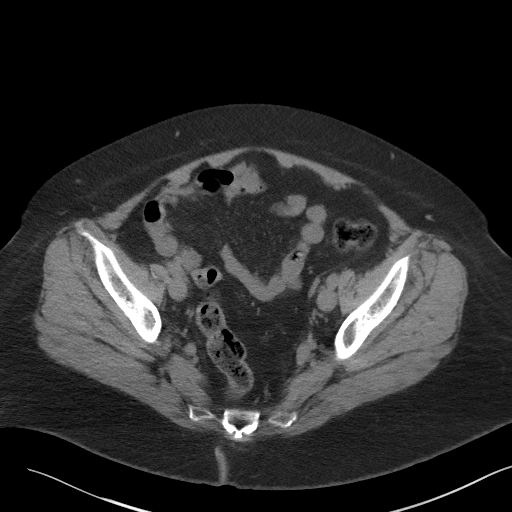
[im 37/97  soft-tissue]
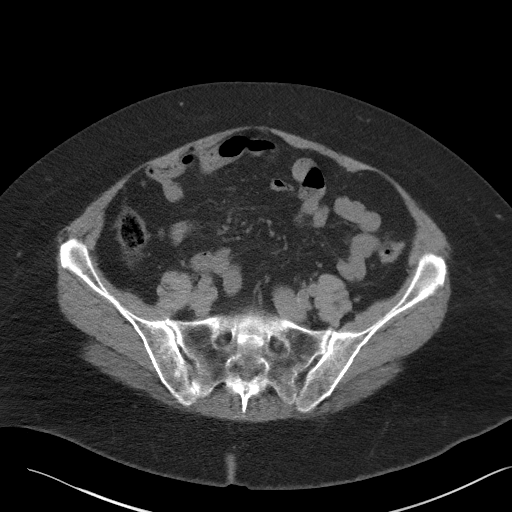
[im 45/97  soft-tissue]
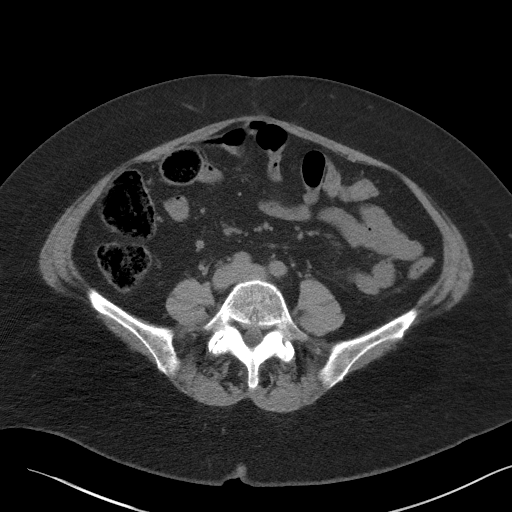
[im 53/97  soft-tissue]
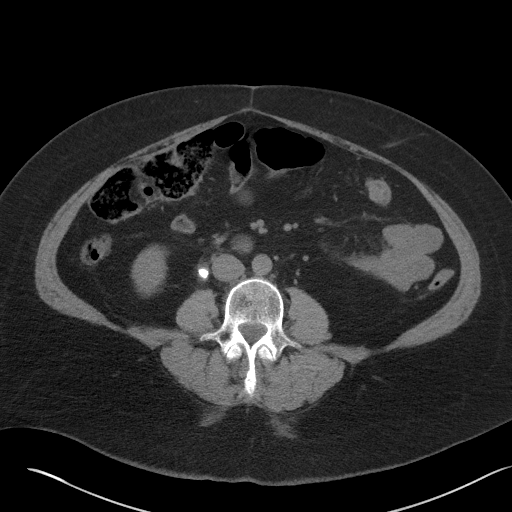
[im 61/97  soft-tissue]
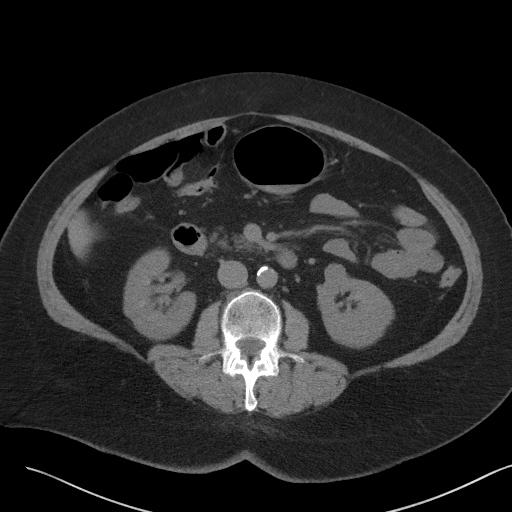
[im 69/97  soft-tissue]
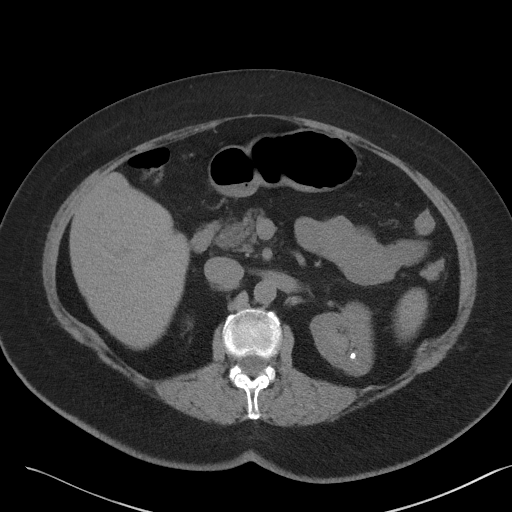
[im 69/97  bone]
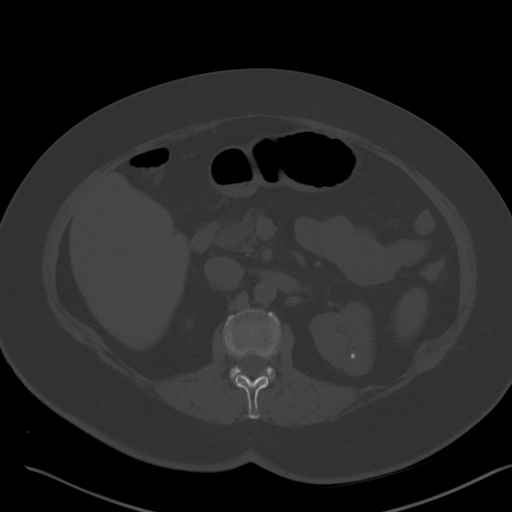
[im 77/97  soft-tissue]
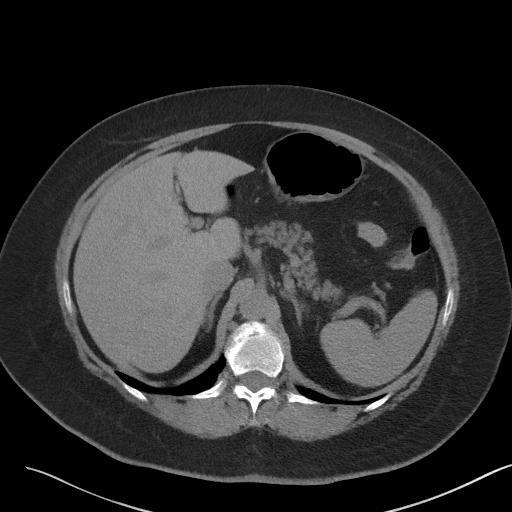
[im 81/97  lung]
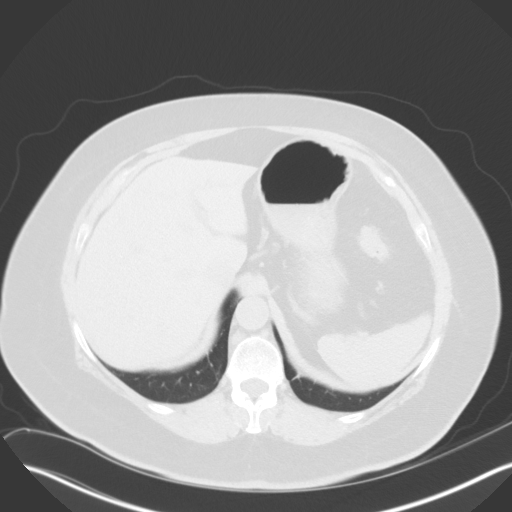
[im 85/97  soft-tissue]
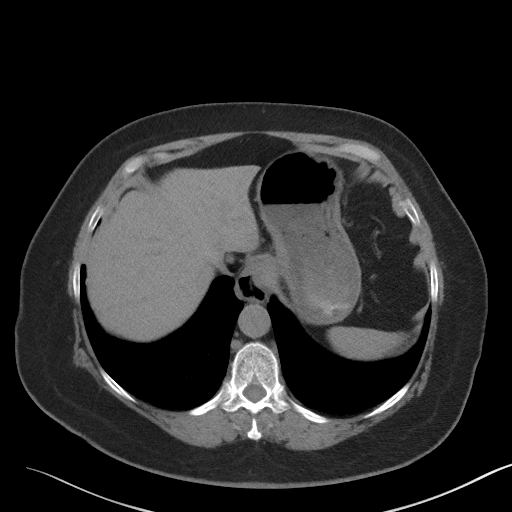
[im 85/97  lung]
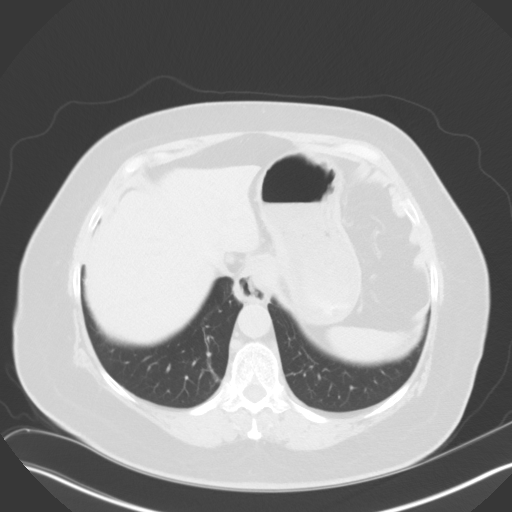
[im 89/97  lung]
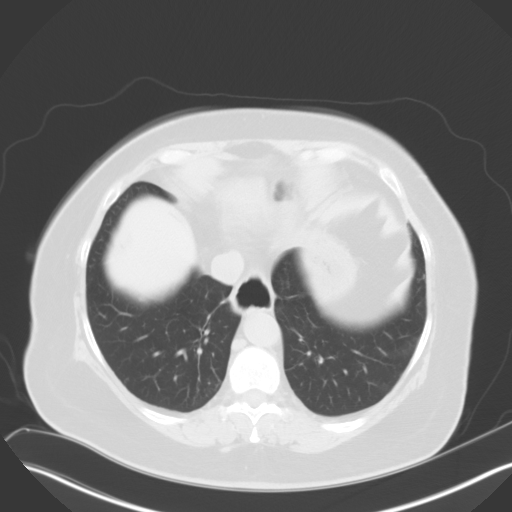
[im 93/97  soft-tissue]
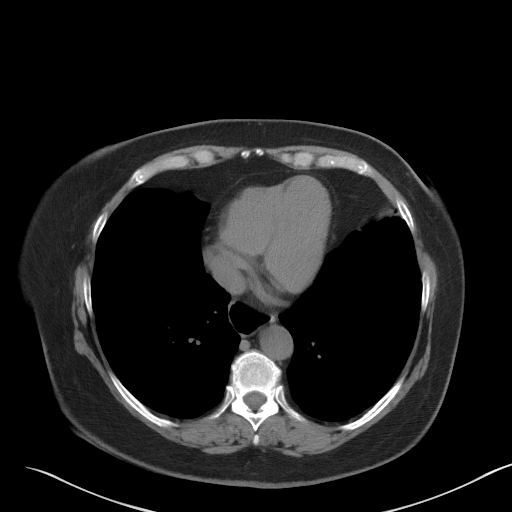
[im 93/97  lung]
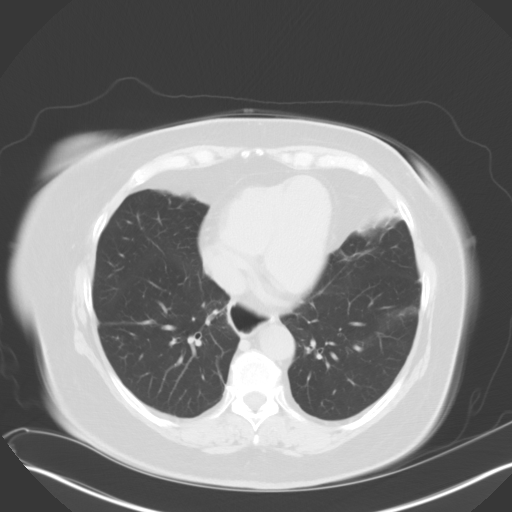

[14 of 32 positions shown; findings below may reference images not displayed]

FINDINGS: Lower chest: No significant pulmonary nodules or acute consolidative
airspace disease. Patulous lower thoracic esophagus.

Hepatobiliary: Normal liver size. No liver mass. Cholecystectomy. No
biliary ductal dilatation.

Pancreas: Normal, with no mass or duct dilation.

Spleen: Normal size. No mass.

Adrenals/Urinary Tract: Normal adrenals. There is a 7 mm stone in
the proximal right lumbar ureter at L4 level. There is minimal
fullness of the central right renal collecting system without overt
right hydronephrosis. No left hydronephrosis. No stones within the
right kidney. Nonobstructing 3 mm upper left renal stone. Additional
punctate nonobstructing scattered left renal stones. Hypodense
cm exophytic anterior lower left renal cortical lesion with density
22 HU (series 2/image 37). No additional contour deforming renal
lesions. Normal caliber left ureter. No additional ureteral stones.
Normal bladder.

Stomach/Bowel: Normal non-distended stomach. Normal caliber small
bowel with no small bowel wall thickening. Normal appendix. Mild
sigmoid diverticulosis, with no large bowel wall thickening or
significant pericolonic fat stranding.

Vascular/Lymphatic: Atherosclerotic nonaneurysmal abdominal aorta.
No pathologically enlarged lymph nodes in the abdomen or pelvis.

Reproductive: Grossly normal uterus. Bilateral tubal ligation clips
are in place. No adnexal mass.

Other: No pneumoperitoneum, ascites or focal fluid collection.

Musculoskeletal: No aggressive appearing focal osseous lesions. Mild
lumbar spondylosis.
IMPRESSION: 1. Proximal right lumbar ureteral 7 mm stone. Minimal fullness of
the central right renal collecting system without overt right
hydronephrosis.
2. Nonobstructing left nephrolithiasis.
3. Indeterminate exophytic hypodense 1.6 cm renal cortical lesion in
the anterior lower left kidney. Renal mass protocol MRI (preferred)
or CT abdomen without and with IV contrast recommended for further
characterization, to exclude renal cell carcinoma.
4. Mild sigmoid diverticulosis.
5.  Aortic Atherosclerosis ([id number]).

## 2021-06-23 ENCOUNTER — Other Ambulatory Visit: Payer: Self-pay | Admitting: Oncology

## 2021-06-23 DIAGNOSIS — G8929 Other chronic pain: Secondary | ICD-10-CM

## 2021-06-23 DIAGNOSIS — D481 Neoplasm of uncertain behavior of connective and other soft tissue: Secondary | ICD-10-CM

## 2021-07-04 ENCOUNTER — Ambulatory Visit: Admission: RE | Admit: 2021-07-04 | Payer: Medicare PPO | Source: Ambulatory Visit

## 2021-07-12 ENCOUNTER — Ambulatory Visit
Admission: RE | Admit: 2021-07-12 | Discharge: 2021-07-12 | Disposition: A | Discharge status: Home / Self Care | Payer: Medicare Other | Source: Ambulatory Visit | Attending: Oncology | Admitting: Oncology

## 2021-07-12 DIAGNOSIS — M25511 Pain in right shoulder: Secondary | ICD-10-CM | POA: Insufficient documentation

## 2021-07-12 DIAGNOSIS — G8929 Other chronic pain: Secondary | ICD-10-CM | POA: Diagnosis present

## 2021-07-12 DIAGNOSIS — D481 Neoplasm of uncertain behavior of connective and other soft tissue: Secondary | ICD-10-CM | POA: Diagnosis present

## 2021-07-12 LAB — POCT I-STAT CREATININE: Creatinine, Ser: 0.6 mg/dL (ref 0.44–1.00)

## 2021-07-12 MED ORDER — IOHEXOL 300 MG/ML  SOLN
100.0000 mL | Freq: Once | INTRAMUSCULAR | Status: AC | PRN
  Administered 2021-07-12: 100 mL via INTRAVENOUS

## 2021-11-14 ENCOUNTER — Encounter (INDEPENDENT_AMBULATORY_CARE_PROVIDER_SITE_OTHER): Payer: Self-pay

## 2022-11-06 ENCOUNTER — Ambulatory Visit: Payer: Medicare PPO | Attending: Oncology

## 2022-11-06 DIAGNOSIS — M25611 Stiffness of right shoulder, not elsewhere classified: Secondary | ICD-10-CM | POA: Diagnosis present

## 2022-11-06 DIAGNOSIS — M25511 Pain in right shoulder: Secondary | ICD-10-CM | POA: Diagnosis present

## 2022-11-06 DIAGNOSIS — G8929 Other chronic pain: Secondary | ICD-10-CM | POA: Insufficient documentation

## 2022-11-06 NOTE — Therapy (Signed)
OUTPATIENT PHYSICAL THERAPY CERVICAL EVALUATION   Patient Name: Savannah Wilkins MRN: [id number] DOB:[birth year], 72 y.o., female Today's Date: 11/06/2022  END OF SESSION:  PT End of Session - 11/06/22 0950     Visit Number 1    Number of Visits 17    Date for PT Re-Evaluation 01/04/23    PT Start Time 0950    PT Stop Time 1033    PT Time Calculation (min) 43 min    Activity Tolerance Patient tolerated treatment well    Behavior During Therapy [facility] for tasks assessed/performed             Past Medical History:  Diagnosis Date   Arthritis    DVT (deep venous thrombosis) (HCC) 2015   pulmonary emboli   GERD (gastroesophageal reflux disease)    occ   Headache    history of ileus 09/2020   History of kidney stones    Lichen sclerosus    Pneumonia 2015   PONV (postoperative nausea and vomiting)    Pre-diabetes    Pulmonary emboli (HCC) 2015   Restless leg syndrome    Past Surgical History:  Procedure Laterality Date   CATARACT EXTRACTION Bilateral 2013   CHOLECYSTECTOMY  1996   COLONOSCOPY  2015   CYST EXCISION Right 2010   pinkie finger   dcr Right    right eye   EXTRACORPOREAL SHOCK WAVE LITHOTRIPSY Bilateral 2006   twice on left and once on right   EXTRACORPOREAL SHOCK WAVE LITHOTRIPSY Right 07/25/2018   Procedure: EXTRACORPOREAL SHOCK WAVE LITHOTRIPSY (ESWL);  Surgeon: Sondra Come, MD;  Location: [facility];  Service: Urology;  Laterality: Right;   EYE SURGERY Bilateral 2013   cataract extractions   HYSTEROSCOPY  2015   IVC FILTER INSERTION N/A 04/06/2020   Procedure: IVC FILTER INSERTION;  Surgeon: Renford Dills, MD;  Location: [facility];  Service: Cardiovascular;  Laterality: N/A;   IVC FILTER REMOVAL N/A 10/26/2020   Procedure: IVC FILTER REMOVAL;  Surgeon: Renford Dills, MD;  Location: [facility];  Service: Cardiovascular;  Laterality: N/A;   IVC FILTER REMOVAL N/A 11/24/2020   Procedure: IVC FILTER REMOVAL;  Surgeon:  Renford Dills, MD;  Location: [facility];  Service: Cardiovascular;  Laterality: N/A;   kidney stone removal Right 2006, 2013   right ureteroscopic stone removals   RETINAL TEAR REPAIR CRYOTHERAPY Bilateral    RIGHT 2007 AND 2008; LEFT 2012   SUBMANDIBULAR GLAND EXCISION Right 03/15/2021   Procedure: EXCISION SUBMANDIBULAR GLAND;  Surgeon: Bud Face, MD;  Location: [facility];  Service: ENT;  Laterality: Right;   TONSILLECTOMY  1984   TOTAL HIP ARTHROPLASTY Right 04/08/2020   Procedure: TOTAL HIP ARTHROPLASTY ANTERIOR APPROACH;  Surgeon: Kennedy Bucker, MD;  Location: [facility];  Service: Orthopedics;  Laterality: Right;   TOTAL HIP ARTHROPLASTY Left 08/12/2020   Procedure: TOTAL HIP ARTHROPLASTY ANTERIOR APPROACH;  Surgeon: Kennedy Bucker, MD;  Location: [facility];  Service: Orthopedics;  Laterality: Left;   TUBAL LIGATION  1985   Patient Active Problem List   Diagnosis Date Noted   Infectious sialoadenitis of right submandibular gland 01/25/2021   Submandibular sialolithiasis 01/25/2021   Status post total hip replacement, left 08/12/2020   S/P hip replacement 04/08/2020   Restless legs 07/02/2018   Postmenopausal bleeding 07/02/2018   Lesion of vulva 07/02/2018   Nephrolithiasis 07/02/2018   Cataract 07/02/2018   Medicare annual wellness visit, initial 11/09/2017   Headache disorder 08/26/2017  Obesity (BMI 35.0-39.9 without comorbidity) 07/09/2017   Lichen sclerosus 07/09/2017   History of pulmonary embolus (PE) 07/09/2017   Chronic daily headache 07/09/2017   DVT of leg (deep venous thrombosis) (HCC) 08/17/2013   Hypoxia 08/16/2013   Pleuritic chest pain 08/15/2013   Acute pulmonary embolism (HCC) 08/15/2013   Arthropathy of pelvic region and thigh 07/03/2012   Arthritis, hip 07/03/2012    PCP: Marisue Ivan, MD   REFERRING PROVIDER: Visgauss, Patsi Sears, MD  REFERRING DIAG: [phone] (ICD-10-CM) - Other specified joint disorders, right  shoulder  THERAPY DIAG:  Acute pain of right shoulder - Plan: PT plan of care cert/re-cert  Stiffness of right shoulder, not elsewhere classified - Plan: PT plan of care cert/re-cert  Rationale for Evaluation and Treatment: Rehabilitation  ONSET DATE: S/P excision of R shoulder mass on 10/06/2022  SUBJECTIVE:                                                                                                                                                                                                         SUBJECTIVE STATEMENT: R shoulder: 0/10 currently, hurts when moving her arm, 3/10 at worst for the past 3 weeks (quick fleeting pain)   Hand dominance: Right  PERTINENT HISTORY:  S/P excision of R shoulder mass on 10/06/2022. R shoulder feels bet ter in general because she is currently not using her arm. 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 Per MD note: Gentle ROM, no active trap or deltoid engagement until 6 weeks post op. Has not yet had PT and feels like she is behind schedule. Wants to be able to lift 20 lbs (granddaughter) with both hands as well as to be able to drive.    BP is controlled per pt.  No latex bands  PAIN:  Are you having pain? Yes: NPRS scale: 0/10 Pain location: R superior shoulder Pain description: quick, fleeting pain Aggravating factors: moving her arm Relieving factors: rest, not moving her arm  PRECAUTIONS: Gentle ROM, no active trap or deltoid engagement until 6 weeks post op  RED FLAGS: No known red flags    WEIGHT BEARING RESTRICTIONS:   FALLS:  Has patient fallen in last 6 months? No  LIVING ENVIRONMENT: Lives with:  Lives in:  Stairs:  Has following equipment at home:   OCCUPATION:   PLOF: Independent  PATIENT GOALS: Wants to be able to lift 20 lbs (granddaughter) with both hands as well as to be able to drive.   NEXT MD VISIT:   OBJECTIVE:  Note: Objective measures were  completed at Evaluation unless otherwise  noted.  DIAGNOSTIC FINDINGS:    PATIENT SURVEYS:  FOTO R shoulder FOTO 4 (11/06/2022)  COGNITION: Overall cognitive status: Within functional limits for tasks assessed  SENSATION:   POSTURE: forward neck, kyphosis, B protracted shoulders  PALPATION: Decreased scar tissue mobility/fascial restrictions palpated   UPPER EXTREMITY ROM:  Passive ROM Right eval Left eval  Shoulder flexion 100   Shoulder extension    Shoulder abduction 90   Shoulder adduction    Shoulder extension    Shoulder internal rotation 90   Shoulder external rotation 55   Elbow flexion    Elbow extension    Wrist flexion    Wrist extension    Wrist ulnar deviation    Wrist radial deviation    Wrist pronation    Wrist supination     (Blank rows = not tested)  UPPER EXTREMITY MMT:  MMT Right eval Left eval  Shoulder flexion    Shoulder extension    Shoulder abduction    Shoulder adduction    Shoulder extension    Shoulder internal rotation    Shoulder external rotation    Middle trapezius    Lower trapezius    Elbow flexion    Elbow extension    Wrist flexion    Wrist extension    Wrist ulnar deviation    Wrist radial deviation    Wrist pronation    Wrist supination    Grip strength     (Blank rows = not tested)    TODAY'S TREATMENT:                                                                                                                              DATE: 11/06/2022   Therapeutic exercise  Supine PROM R shoulder flexion, abduction, ER (in 90 degrees abduction 10x each direction)  Seated R shoulder PROM table slides Flexion 10x Abduction 10x   Improved exercise technique, movement at target joints, use of target muscles after mod verbal, visual, tactile cues.       PATIENT EDUCATION:  Education details: there-ex, HEP, POC Person educated: Patient Education method: Explanation, Demonstration, Tactile cues, Verbal cues, and Handouts Education comprehension:  verbalized understanding and returned demonstration  HOME EXERCISE PROGRAM: Access Code: [email or web address] URL: [email or web address] Date: 11/06/2022 Prepared by: Loralyn Freshwater  Exercises - Seated Shoulder Flexion Towel Slide at Table Top  - 3 x daily - 7 x weekly - 3 sets - 10 reps - Seated Shoulder Abduction Towel Slide at Table Top  - 3 x daily - 7 x weekly - 3 sets - 10 reps  ASSESSMENT:    CLINICAL IMPRESSION: Patient is a 73 y.o. female who was seen today for physical therapy evaluation and treatment for S/P excision of R shoulder mass on 10/06/2022. She also presents with altered posture, limited R shoulder PROM, R UE weakness, decrease fascial mobility, pain, and difficulty performing tasks which involve moving her R  arm. Pt will benefit from skilled physical therapy services to address the aforementioned deficits.        OBJECTIVE IMPAIRMENTS: decreased ROM, decreased strength, impaired flexibility, improper body mechanics, postural dysfunction, and pain.   ACTIVITY LIMITATIONS: carrying, lifting, bathing, toileting, dressing, reach over head, and hygiene/grooming  PARTICIPATION LIMITATIONS:   PERSONAL FACTORS: Age, Fitness, Past/current experiences, Time since onset of injury/illness/exacerbation, and 1-2 comorbidities: arthritis, pre-diabetes  are also affecting patient's functional outcome.   REHAB POTENTIAL: Fair    CLINICAL DECISION MAKING: Stable/uncomplicated  EVALUATION COMPLEXITY: Low   GOALS: Goals reviewed with patient? Yes  SHORT TERM GOALS: Target date: 11/24/2022  Pt will be independent with her initial HEP to improve ROM, strength, function, and ability to raise her arm and lift without pain or difficulty.  Baseline: Pt has started her initial HEP (11/06/2022) Goal status: INITIAL      LONG TERM GOALS: Target date: 01/04/2023  Pt will improve her R shoulder flexion and abduction AROM to 140 degrees or more and ER AROM to 65 degrees or  more to promote ability to raise her arm with less difficulty up when appropriate.  Baseline:  Passive ROM Right eval  Shoulder flexion 100  Shoulder abduction 90  Shoulder internal rotation 90  Shoulder external rotation 55   (11/06/2022)  Goal status: INITIAL  2.  Pt will have at least 4/5 R shoulder flexion, abduction, ER, and IR to promote ability to use her R UE for functional tasks with less difficulty.  Baseline: R shoulder strength not yet tested secondary to precautions and to allow for more healing (11/06/2022) Goal status: INITIAL  3.  Pt will improve her R shoulder FOTO score by at least 10 points as a demonstration of improved function.  Baseline: R shoulder FOTO 4 (11/06/2022) Goal status: INITIAL     PLAN:  PT FREQUENCY: 1-2x/week  PT DURATION: 8 weeks  PLANNED INTERVENTIONS: 97110-Therapeutic exercises, 97530- Therapeutic activity, [id number]- Neuromuscular re-education, 97140- Manual therapy, 97014- Electrical stimulation (unattended), 97033- Ionotophoresis 4mg /ml Dexamethasone, Patient/Family education, and Dry Needling  PLAN FOR NEXT SESSION: posture, PROM, manual techniques, modalities PRN   Lorene Samaan, PT, DPT 11/06/2022, 12:51 PM

## 2022-11-10 ENCOUNTER — Ambulatory Visit: Payer: Medicare PPO

## 2022-11-10 DIAGNOSIS — M25511 Pain in right shoulder: Secondary | ICD-10-CM

## 2022-11-10 DIAGNOSIS — G8929 Other chronic pain: Secondary | ICD-10-CM

## 2022-11-10 DIAGNOSIS — M25611 Stiffness of right shoulder, not elsewhere classified: Secondary | ICD-10-CM

## 2022-11-10 NOTE — Therapy (Signed)
OUTPATIENT PHYSICAL THERAPY Treatment   Patient Name: Savannah Wilkins MRN: [id number] DOB:[birth year], 72 y.o., female Today's Date: 11/10/2022  END OF SESSION:  PT End of Session - 11/10/22 0728     Visit Number 2    Number of Visits 17    Date for PT Re-Evaluation 01/04/23    Authorization Time Period 11/06/22-01/12/23    Authorization - Visit Number 2    Authorization - Number of Visits 20    PT Start Time 0729    PT Stop Time 0803    PT Time Calculation (min) 34 min    Activity Tolerance Patient tolerated treatment well    Behavior During Therapy [facility] for tasks assessed/performed              Past Medical History:  Diagnosis Date   Arthritis    DVT (deep venous thrombosis) (HCC) 2015   pulmonary emboli   GERD (gastroesophageal reflux disease)    occ   Headache    history of ileus 09/2020   History of kidney stones    Lichen sclerosus    Pneumonia 2015   PONV (postoperative nausea and vomiting)    Pre-diabetes    Pulmonary emboli (HCC) 2015   Restless leg syndrome    Past Surgical History:  Procedure Laterality Date   CATARACT EXTRACTION Bilateral 2013   CHOLECYSTECTOMY  1996   COLONOSCOPY  2015   CYST EXCISION Right 2010   pinkie finger   dcr Right    right eye   EXTRACORPOREAL SHOCK WAVE LITHOTRIPSY Bilateral 2006   twice on left and once on right   EXTRACORPOREAL SHOCK WAVE LITHOTRIPSY Right 07/25/2018   Procedure: EXTRACORPOREAL SHOCK WAVE LITHOTRIPSY (ESWL);  Surgeon: Sondra Come, MD;  Location: [facility];  Service: Urology;  Laterality: Right;   EYE SURGERY Bilateral 2013   cataract extractions   HYSTEROSCOPY  2015   IVC FILTER INSERTION N/A 04/06/2020   Procedure: IVC FILTER INSERTION;  Surgeon: Renford Dills, MD;  Location: [facility];  Service: Cardiovascular;  Laterality: N/A;   IVC FILTER REMOVAL N/A 10/26/2020   Procedure: IVC FILTER REMOVAL;  Surgeon: Renford Dills, MD;  Location: [facility];  Service:  Cardiovascular;  Laterality: N/A;   IVC FILTER REMOVAL N/A 11/24/2020   Procedure: IVC FILTER REMOVAL;  Surgeon: Renford Dills, MD;  Location: [facility];  Service: Cardiovascular;  Laterality: N/A;   kidney stone removal Right 2006, 2013   right ureteroscopic stone removals   RETINAL TEAR REPAIR CRYOTHERAPY Bilateral    RIGHT 2007 AND 2008; LEFT 2012   SUBMANDIBULAR GLAND EXCISION Right 03/15/2021   Procedure: EXCISION SUBMANDIBULAR GLAND;  Surgeon: Bud Face, MD;  Location: [facility];  Service: ENT;  Laterality: Right;   TONSILLECTOMY  1984   TOTAL HIP ARTHROPLASTY Right 04/08/2020   Procedure: TOTAL HIP ARTHROPLASTY ANTERIOR APPROACH;  Surgeon: Kennedy Bucker, MD;  Location: [facility];  Service: Orthopedics;  Laterality: Right;   TOTAL HIP ARTHROPLASTY Left 08/12/2020   Procedure: TOTAL HIP ARTHROPLASTY ANTERIOR APPROACH;  Surgeon: Kennedy Bucker, MD;  Location: [facility];  Service: Orthopedics;  Laterality: Left;   TUBAL LIGATION  1985   Patient Active Problem List   Diagnosis Date Noted   Infectious sialoadenitis of right submandibular gland 01/25/2021   Submandibular sialolithiasis 01/25/2021   Status post total hip replacement, left 08/12/2020   S/P hip replacement 04/08/2020   Restless legs 07/02/2018   Postmenopausal bleeding 07/02/2018   Lesion of vulva  07/02/2018   Nephrolithiasis 07/02/2018   Cataract 07/02/2018   Medicare annual wellness visit, initial 11/09/2017   Headache disorder 08/26/2017   Obesity (BMI 35.0-39.9 without comorbidity) 07/09/2017   Lichen sclerosus 07/09/2017   History of pulmonary embolus (PE) 07/09/2017   Chronic daily headache 07/09/2017   DVT of leg (deep venous thrombosis) (HCC) 08/17/2013   Hypoxia 08/16/2013   Pleuritic chest pain 08/15/2013   Acute pulmonary embolism (HCC) 08/15/2013   Arthropathy of pelvic region and thigh 07/03/2012   Arthritis, hip 07/03/2012    PCP: Marisue Ivan, MD   REFERRING PROVIDER:  Visgauss, Patsi Sears, MD  REFERRING DIAG: [phone] (ICD-10-CM) - Other specified joint disorders, right shoulder  THERAPY DIAG:  Acute pain of right shoulder  Stiffness of right shoulder, not elsewhere classified  Chronic right shoulder pain  Rationale for Evaluation and Treatment: Rehabilitation  ONSET DATE: S/P excision of R shoulder mass on 10/06/2022  SUBJECTIVE:                                                                                                                                                                                                         SUBJECTIVE STATEMENT: R shoulder is not bad. Feeling pretty good actually. Has been doing her exercises.      Hand dominance: Right  PERTINENT HISTORY:  S/P excision of R shoulder mass on 10/06/2022. R shoulder feels bet ter in general because she is currently not using her arm. 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 Per MD note: Gentle ROM, no active trap or deltoid engagement until 6 weeks post op. Has not yet had PT and feels like she is behind schedule. Wants to be able to lift 20 lbs (granddaughter) with both hands as well as to be able to drive.    BP is controlled per pt.  No latex bands  PAIN:  Are you having pain? Yes: NPRS scale: 0/10 Pain location: R superior shoulder Pain description: quick, fleeting pain Aggravating factors: moving her arm Relieving factors: rest, not moving her arm  PRECAUTIONS: Gentle ROM, no active trap or deltoid engagement until 6 weeks post op  RED FLAGS: No known red flags    WEIGHT BEARING RESTRICTIONS:   FALLS:  Has patient fallen in last 6 months? No  LIVING ENVIRONMENT: Lives with:  Lives in:  Stairs:  Has following equipment at home:   OCCUPATION:   PLOF: Independent  PATIENT GOALS: Wants to be able to lift 20 lbs (granddaughter) with both hands as well as to be able to drive.  NEXT MD VISIT:   OBJECTIVE:  Note: Objective measures were completed  at Evaluation unless otherwise noted.  DIAGNOSTIC FINDINGS:    PATIENT SURVEYS:  FOTO R shoulder FOTO 4 (11/06/2022)  COGNITION: Overall cognitive status: Within functional limits for tasks assessed  SENSATION:   POSTURE: forward neck, kyphosis, B protracted shoulders  PALPATION: Decreased scar tissue mobility/fascial restrictions palpated   UPPER EXTREMITY ROM:  Passive ROM Right eval Left eval  Shoulder flexion 100   Shoulder extension    Shoulder abduction 90   Shoulder adduction    Shoulder extension    Shoulder internal rotation 90   Shoulder external rotation 55   Elbow flexion    Elbow extension    Wrist flexion    Wrist extension    Wrist ulnar deviation    Wrist radial deviation    Wrist pronation    Wrist supination     (Blank rows = not tested)  UPPER EXTREMITY MMT:  MMT Right eval Left eval  Shoulder flexion    Shoulder extension    Shoulder abduction    Shoulder adduction    Shoulder extension    Shoulder internal rotation    Shoulder external rotation    Middle trapezius    Lower trapezius    Elbow flexion    Elbow extension    Wrist flexion    Wrist extension    Wrist ulnar deviation    Wrist radial deviation    Wrist pronation    Wrist supination    Grip strength     (Blank rows = not tested)    TODAY'S TREATMENT:                                                                                                                              DATE: 11/10/2022   Therapeutic exercise  Supine PROM R shoulder  flexion 10x3  Scaption 10x3 abduction 10x3  Seated R shoulder ER with SPC/dowel ROM  10x3    Improved exercise technique, movement at target joints, use of target muscles after mod verbal, visual, tactile cues.     Manual therapy Seated STM around scar tissue to improve fascial mobility        PATIENT EDUCATION:  Education details: there-ex, HEP, POC Person educated: Patient Education method: Explanation,  Demonstration, Tactile cues, Verbal cues, and Handouts Education comprehension: verbalized understanding and returned demonstration  HOME EXERCISE PROGRAM: Access Code: [email or web address] URL: [email or web address] Date: 11/06/2022 Prepared by: Loralyn Freshwater  Exercises - Seated Shoulder Flexion Towel Slide at Table Top  - 3 x daily - 7 x weekly - 3 sets - 10 reps - Seated Shoulder Abduction Towel Slide at Table Top  - 3 x daily - 7 x weekly - 3 sets - 10 reps  ASSESSMENT:    CLINICAL IMPRESSION: Continued working on improving R shoulder PROM for flexion, scaption, and abduction and started ER AAROM to decrease stiffness and promote ability to raise her R  arm and reach when appropriate. No pain reported throughout session. Performed gentle soft tissue mobilization around surgical scar to decrease fascial restrictions. Good fascial mobility palpated. Pt will benefit from continued skilled physical therapy services to improve ROM, strength, and function.        OBJECTIVE IMPAIRMENTS: decreased ROM, decreased strength, impaired flexibility, improper body mechanics, postural dysfunction, and pain.   ACTIVITY LIMITATIONS: carrying, lifting, bathing, toileting, dressing, reach over head, and hygiene/grooming  PARTICIPATION LIMITATIONS:   PERSONAL FACTORS: Age, Fitness, Past/current experiences, Time since onset of injury/illness/exacerbation, and 1-2 comorbidities: arthritis, pre-diabetes  are also affecting patient's functional outcome.   REHAB POTENTIAL: Fair    CLINICAL DECISION MAKING: Stable/uncomplicated  EVALUATION COMPLEXITY: Low   GOALS: Goals reviewed with patient? Yes  SHORT TERM GOALS: Target date: 11/24/2022  Pt will be independent with her initial HEP to improve ROM, strength, function, and ability to raise her arm and lift without pain or difficulty.  Baseline: Pt has started her initial HEP (11/06/2022) Goal status: INITIAL      LONG TERM GOALS: Target  date: 01/04/2023  Pt will improve her R shoulder flexion and abduction AROM to 140 degrees or more and ER AROM to 65 degrees or more to promote ability to raise her arm with less difficulty up when appropriate.  Baseline:  Passive ROM Right eval  Shoulder flexion 100  Shoulder abduction 90  Shoulder internal rotation 90  Shoulder external rotation 55   (11/06/2022)  Goal status: INITIAL  2.  Pt will have at least 4/5 R shoulder flexion, abduction, ER, and IR to promote ability to use her R UE for functional tasks with less difficulty.  Baseline: R shoulder strength not yet tested secondary to precautions and to allow for more healing (11/06/2022) Goal status: INITIAL  3.  Pt will improve her R shoulder FOTO score by at least 10 points as a demonstration of improved function.  Baseline: R shoulder FOTO 4 (11/06/2022) Goal status: INITIAL     PLAN:  PT FREQUENCY: 1-2x/week  PT DURATION: 8 weeks  PLANNED INTERVENTIONS: 97110-Therapeutic exercises, 97530- Therapeutic activity, [id number]- Neuromuscular re-education, 97140- Manual therapy, 97014- Electrical stimulation (unattended), 97033- Ionotophoresis 4mg /ml Dexamethasone, Patient/Family education, and Dry Needling  PLAN FOR NEXT SESSION: posture, PROM, manual techniques, modalities PRN   Nekia Maxham, PT, DPT 11/10/2022, 8:10 AM

## 2022-11-16 ENCOUNTER — Ambulatory Visit: Payer: Medicare PPO

## 2022-11-20 ENCOUNTER — Ambulatory Visit: Payer: Medicare PPO | Attending: Oncology

## 2022-11-20 DIAGNOSIS — M25511 Pain in right shoulder: Secondary | ICD-10-CM | POA: Insufficient documentation

## 2022-11-20 DIAGNOSIS — G8929 Other chronic pain: Secondary | ICD-10-CM | POA: Diagnosis present

## 2022-11-20 DIAGNOSIS — M25611 Stiffness of right shoulder, not elsewhere classified: Secondary | ICD-10-CM | POA: Insufficient documentation

## 2022-11-20 NOTE — Therapy (Signed)
OUTPATIENT PHYSICAL THERAPY Treatment   Patient Name: Savannah Wilkins MRN: [id number] DOB:11-Mar-1950, 72 y.o., female Today's Date: 11/20/2022  END OF SESSION:  PT End of Session - 11/20/22 1439     Visit Number 3    Number of Visits 17    Date for PT Re-Evaluation 01/04/23    Authorization Time Period 11/06/22-01/12/23    Authorization - Number of Visits 20    PT Start Time 1439    PT Stop Time 1518    PT Time Calculation (min) 39 min    Activity Tolerance Patient tolerated treatment well    Behavior During Therapy [facility] for tasks assessed/performed               Past Medical History:  Diagnosis Date   Arthritis    DVT (deep venous thrombosis) (HCC) 2015   pulmonary emboli   GERD (gastroesophageal reflux disease)    occ   Headache    history of ileus 09/2020   History of kidney stones    Lichen sclerosus    Pneumonia 2015   PONV (postoperative nausea and vomiting)    Pre-diabetes    Pulmonary emboli (HCC) 2015   Restless leg syndrome    Past Surgical History:  Procedure Laterality Date   CATARACT EXTRACTION Bilateral 2013   CHOLECYSTECTOMY  1996   COLONOSCOPY  2015   CYST EXCISION Right 2010   pinkie finger   dcr Right    right eye   EXTRACORPOREAL SHOCK WAVE LITHOTRIPSY Bilateral 2006   twice on left and once on right   EXTRACORPOREAL SHOCK WAVE LITHOTRIPSY Right 07/25/2018   Procedure: EXTRACORPOREAL SHOCK WAVE LITHOTRIPSY (ESWL);  Surgeon: Sondra Come, MD;  Location: [facility];  Service: Urology;  Laterality: Right;   EYE SURGERY Bilateral 2013   cataract extractions   HYSTEROSCOPY  2015   IVC FILTER INSERTION N/A 04/06/2020   Procedure: IVC FILTER INSERTION;  Surgeon: Renford Dills, MD;  Location: [facility];  Service: Cardiovascular;  Laterality: N/A;   IVC FILTER REMOVAL N/A 10/26/2020   Procedure: IVC FILTER REMOVAL;  Surgeon: Renford Dills, MD;  Location: [facility];  Service: Cardiovascular;  Laterality: N/A;    IVC FILTER REMOVAL N/A 11/24/2020   Procedure: IVC FILTER REMOVAL;  Surgeon: Renford Dills, MD;  Location: [facility];  Service: Cardiovascular;  Laterality: N/A;   kidney stone removal Right 2006, 2013   right ureteroscopic stone removals   RETINAL TEAR REPAIR CRYOTHERAPY Bilateral    RIGHT 2007 AND 2008; LEFT 2012   SUBMANDIBULAR GLAND EXCISION Right 03/15/2021   Procedure: EXCISION SUBMANDIBULAR GLAND;  Surgeon: Bud Face, MD;  Location: [facility];  Service: ENT;  Laterality: Right;   TONSILLECTOMY  1984   TOTAL HIP ARTHROPLASTY Right 04/08/2020   Procedure: TOTAL HIP ARTHROPLASTY ANTERIOR APPROACH;  Surgeon: Kennedy Bucker, MD;  Location: [facility];  Service: Orthopedics;  Laterality: Right;   TOTAL HIP ARTHROPLASTY Left 08/12/2020   Procedure: TOTAL HIP ARTHROPLASTY ANTERIOR APPROACH;  Surgeon: Kennedy Bucker, MD;  Location: [facility];  Service: Orthopedics;  Laterality: Left;   TUBAL LIGATION  1985   Patient Active Problem List   Diagnosis Date Noted   Infectious sialoadenitis of right submandibular gland 01/25/2021   Submandibular sialolithiasis 01/25/2021   Status post total hip replacement, left 08/12/2020   S/P hip replacement 04/08/2020   Restless legs 07/02/2018   Postmenopausal bleeding 07/02/2018   Lesion of vulva 07/02/2018   Nephrolithiasis 07/02/2018  Cataract 07/02/2018   Medicare annual wellness visit, initial 11/09/2017   Headache disorder 08/26/2017   Obesity (BMI 35.0-39.9 without comorbidity) 07/09/2017   Lichen sclerosus 07/09/2017   History of pulmonary embolus (PE) 07/09/2017   Chronic daily headache 07/09/2017   DVT of leg (deep venous thrombosis) (HCC) 08/17/2013   Hypoxia 08/16/2013   Pleuritic chest pain 08/15/2013   Acute pulmonary embolism (HCC) 08/15/2013   Arthropathy of pelvic region and thigh 07/03/2012   Arthritis, hip 07/03/2012    PCP: Marisue Ivan, MD   REFERRING PROVIDER: Visgauss, Patsi Sears, MD  REFERRING  DIAG: [phone] (ICD-10-CM) - Other specified joint disorders, right shoulder  THERAPY DIAG:  Acute pain of right shoulder  Stiffness of right shoulder, not elsewhere classified  Chronic right shoulder pain  Rationale for Evaluation and Treatment: Rehabilitation  ONSET DATE: S/P excision of R shoulder mass on 10/06/2022  SUBJECTIVE:                                                                                                                                                                                                         SUBJECTIVE STATEMENT: R shoulder is ok, has been using it since, Thursday. No pain currently.      Hand dominance: Right  PERTINENT HISTORY:  S/P excision of R shoulder mass on 10/06/2022. R shoulder feels bet ter in general because she is currently not using her arm. 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 Per MD note: Gentle ROM, no active trap or deltoid engagement until 6 weeks post op. Has not yet had PT and feels like she is behind schedule. Wants to be able to lift 20 lbs (granddaughter) with both hands as well as to be able to drive.    BP is controlled per pt.  No latex bands  PAIN:  Are you having pain? Yes: NPRS scale: 0/10 Pain location: R superior shoulder Pain description: quick, fleeting pain Aggravating factors: moving her arm Relieving factors: rest, not moving her arm  PRECAUTIONS: Gentle ROM, no active trap or deltoid engagement until 6 weeks post op  RED FLAGS: No known red flags    WEIGHT BEARING RESTRICTIONS:   FALLS:  Has patient fallen in last 6 months? No  LIVING ENVIRONMENT: Lives with:  Lives in:  Stairs:  Has following equipment at home:   OCCUPATION:   PLOF: Independent  PATIENT GOALS: Wants to be able to lift 20 lbs (granddaughter) with both hands as well as to be able to drive.   NEXT MD VISIT:   OBJECTIVE:  Note: Objective measures were completed at Evaluation unless otherwise  noted.  DIAGNOSTIC FINDINGS:    PATIENT SURVEYS:  FOTO R shoulder FOTO 4 (11/06/2022)  COGNITION: Overall cognitive status: Within functional limits for tasks assessed  SENSATION:   POSTURE: forward neck, kyphosis, B protracted shoulders  PALPATION: Decreased scar tissue mobility/fascial restrictions palpated   UPPER EXTREMITY ROM:  Passive ROM Right eval Left eval  Shoulder flexion 100   Shoulder extension    Shoulder abduction 90   Shoulder adduction    Shoulder extension    Shoulder internal rotation 90   Shoulder external rotation 55   Elbow flexion    Elbow extension    Wrist flexion    Wrist extension    Wrist ulnar deviation    Wrist radial deviation    Wrist pronation    Wrist supination     (Blank rows = not tested)  UPPER EXTREMITY MMT:  MMT Right eval Left eval  Shoulder flexion    Shoulder extension    Shoulder abduction    Shoulder adduction    Shoulder extension    Shoulder internal rotation    Shoulder external rotation    Middle trapezius    Lower trapezius    Elbow flexion    Elbow extension    Wrist flexion    Wrist extension    Wrist ulnar deviation    Wrist radial deviation    Wrist pronation    Wrist supination    Grip strength     (Blank rows = not tested)    TODAY'S TREATMENT:                                                                                                                              DATE: 11/20/2022   Therapeutic exercise  Pt finished 6 weeks on 11/17/2022  Seated B scapular retraction 10x3 with 5 second holds  Seated pulley AAROM   Flexion 10x. R anterior shoulder catch   Scaption 10x2  Abduction 10x2  Seated R shoulder ER AROM 4x (very easy)  R shoulder isometrics  ER 10x5 seconds for 3 sets  IR 10x5 seconds   Flexion 10x5 seconds   Abduction 10x5 seconds   Extension 10x5 seconds      Improved exercise technique, movement at target joints, use of target muscles after mod verbal,  visual, tactile cues.        PATIENT EDUCATION:  Education details: there-ex, HEP, POC Person educated: Patient Education method: Explanation, Demonstration, Tactile cues, Verbal cues, and Handouts Education comprehension: verbalized understanding and returned demonstration  HOME EXERCISE PROGRAM: Access Code: [email or web address] URL: [email or web address] Date: 11/06/2022 Prepared by: Loralyn Freshwater  Exercises - Seated Shoulder Flexion Towel Slide at Table Top  - 3 x daily - 7 x weekly - 3 sets - 10 reps - Seated Shoulder Abduction Towel Slide at Table Top  - 3 x daily - 7 x weekly - 3 sets - 10 reps -  Seated Scapular Retraction  - 1 x daily - 7 x weekly - 3 sets - 10 reps - 5 seconds hold - Standing Isometric Shoulder External Rotation with Doorway  - 1 x daily - 7 x weekly - 3 sets - 10 reps - 5 seconds hold - Standing Isometric Shoulder Internal Rotation at Doorway  - 1 x daily - 7 x weekly - 3 sets - 10 reps - 5 seconds hold - Standing Isometric Shoulder Flexion with Doorway - Arm Bent  - 1 x daily - 7 x weekly - 3 sets - 10 reps - 5 seconds hold - Standing Isometric Shoulder Abduction with Doorway  - 1 x daily - 7 x weekly - 3 sets - 10 reps - 5 seconds hold - Standing Isometric Shoulder Extension with Doorway - Arm Bent  - 1 x daily - 7 x weekly - 3 sets - 10 reps - 5 seconds hold   ASSESSMENT:    CLINICAL IMPRESSION: Pt is past 6 weeks today, started  more active trap and deltoid exercise to promote ability to raise her arm more comfortably. Cues needed for proper scapular movement. Initiated isometric shoulder strengthening to help decrease weakness and ease R shoulder into further strengthening exercises. Pt tolerated session well without complain of pain. Pt will benefit from continued skilled physical therapy services to improve ROM, strength, and function.        OBJECTIVE IMPAIRMENTS: decreased ROM, decreased strength, impaired flexibility, improper body  mechanics, postural dysfunction, and pain.   ACTIVITY LIMITATIONS: carrying, lifting, bathing, toileting, dressing, reach over head, and hygiene/grooming  PARTICIPATION LIMITATIONS:   PERSONAL FACTORS: Age, Fitness, Past/current experiences, Time since onset of injury/illness/exacerbation, and 1-2 comorbidities: arthritis, pre-diabetes  are also affecting patient's functional outcome.   REHAB POTENTIAL: Fair    CLINICAL DECISION MAKING: Stable/uncomplicated  EVALUATION COMPLEXITY: Low   GOALS: Goals reviewed with patient? Yes  SHORT TERM GOALS: Target date: 11/24/2022  Pt will be independent with her initial HEP to improve ROM, strength, function, and ability to raise her arm and lift without pain or difficulty.  Baseline: Pt has started her initial HEP (11/06/2022) Goal status: INITIAL      LONG TERM GOALS: Target date: 01/04/2023  Pt will improve her R shoulder flexion and abduction AROM to 140 degrees or more and ER AROM to 65 degrees or more to promote ability to raise her arm with less difficulty up when appropriate.  Baseline:  Passive ROM Right eval  Shoulder flexion 100  Shoulder abduction 90  Shoulder internal rotation 90  Shoulder external rotation 55   (11/06/2022)  Goal status: INITIAL  2.  Pt will have at least 4/5 R shoulder flexion, abduction, ER, and IR to promote ability to use her R UE for functional tasks with less difficulty.  Baseline: R shoulder strength not yet tested secondary to precautions and to allow for more healing (11/06/2022) Goal status: INITIAL  3.  Pt will improve her R shoulder FOTO score by at least 10 points as a demonstration of improved function.  Baseline: R shoulder FOTO 4 (11/06/2022) Goal status: INITIAL     PLAN:  PT FREQUENCY: 1-2x/week  PT DURATION: 8 weeks  PLANNED INTERVENTIONS: 97110-Therapeutic exercises, 97530- Therapeutic activity, [id number]- Neuromuscular re-education, 97140- Manual therapy, 97014- Electrical  stimulation (unattended), 97033- Ionotophoresis 4mg /ml Dexamethasone, Patient/Family education, and Dry Needling  PLAN FOR NEXT SESSION: posture, PROM, manual techniques, modalities PRN   Antron Seth, PT, DPT 11/20/2022, 7:37 PM

## 2022-11-23 ENCOUNTER — Ambulatory Visit: Payer: Medicare PPO

## 2022-11-23 DIAGNOSIS — G8929 Other chronic pain: Secondary | ICD-10-CM

## 2022-11-23 DIAGNOSIS — M25611 Stiffness of right shoulder, not elsewhere classified: Secondary | ICD-10-CM

## 2022-11-23 DIAGNOSIS — M25511 Pain in right shoulder: Secondary | ICD-10-CM | POA: Diagnosis not present

## 2022-11-23 NOTE — Therapy (Signed)
OUTPATIENT PHYSICAL THERAPY Treatment   Patient Name: Savannah Wilkins MRN: [id number] DOB:1950/10/15, 72 y.o., female Today's Date: 11/23/2022  END OF SESSION:  PT End of Session - 11/23/22 1033     Visit Number 4    Number of Visits 17    Date for PT Re-Evaluation 01/04/23    Authorization Time Period 11/06/22-01/12/23    Authorization - Number of Visits 20    PT Start Time 1032    PT Stop Time 1115    PT Time Calculation (min) 43 min    Activity Tolerance Patient tolerated treatment well    Behavior During Therapy [facility] for tasks assessed/performed                Past Medical History:  Diagnosis Date   Arthritis    DVT (deep venous thrombosis) (HCC) 2015   pulmonary emboli   GERD (gastroesophageal reflux disease)    occ   Headache    history of ileus 09/2020   History of kidney stones    Lichen sclerosus    Pneumonia 2015   PONV (postoperative nausea and vomiting)    Pre-diabetes    Pulmonary emboli (HCC) 2015   Restless leg syndrome    Past Surgical History:  Procedure Laterality Date   CATARACT EXTRACTION Bilateral 2013   CHOLECYSTECTOMY  1996   COLONOSCOPY  2015   CYST EXCISION Right 2010   pinkie finger   dcr Right    right eye   EXTRACORPOREAL SHOCK WAVE LITHOTRIPSY Bilateral 2006   twice on left and once on right   EXTRACORPOREAL SHOCK WAVE LITHOTRIPSY Right 07/25/2018   Procedure: EXTRACORPOREAL SHOCK WAVE LITHOTRIPSY (ESWL);  Surgeon: Sondra Come, MD;  Location: [facility];  Service: Urology;  Laterality: Right;   EYE SURGERY Bilateral 2013   cataract extractions   HYSTEROSCOPY  2015   IVC FILTER INSERTION N/A 04/06/2020   Procedure: IVC FILTER INSERTION;  Surgeon: Renford Dills, MD;  Location: [facility];  Service: Cardiovascular;  Laterality: N/A;   IVC FILTER REMOVAL N/A 10/26/2020   Procedure: IVC FILTER REMOVAL;  Surgeon: Renford Dills, MD;  Location: [facility];  Service: Cardiovascular;  Laterality: N/A;    IVC FILTER REMOVAL N/A 11/24/2020   Procedure: IVC FILTER REMOVAL;  Surgeon: Renford Dills, MD;  Location: [facility];  Service: Cardiovascular;  Laterality: N/A;   kidney stone removal Right 2006, 2013   right ureteroscopic stone removals   RETINAL TEAR REPAIR CRYOTHERAPY Bilateral    RIGHT 2007 AND 2008; LEFT 2012   SUBMANDIBULAR GLAND EXCISION Right 03/15/2021   Procedure: EXCISION SUBMANDIBULAR GLAND;  Surgeon: Bud Face, MD;  Location: [facility];  Service: ENT;  Laterality: Right;   TONSILLECTOMY  1984   TOTAL HIP ARTHROPLASTY Right 04/08/2020   Procedure: TOTAL HIP ARTHROPLASTY ANTERIOR APPROACH;  Surgeon: Kennedy Bucker, MD;  Location: [facility];  Service: Orthopedics;  Laterality: Right;   TOTAL HIP ARTHROPLASTY Left 08/12/2020   Procedure: TOTAL HIP ARTHROPLASTY ANTERIOR APPROACH;  Surgeon: Kennedy Bucker, MD;  Location: [facility];  Service: Orthopedics;  Laterality: Left;   TUBAL LIGATION  1985   Patient Active Problem List   Diagnosis Date Noted   Infectious sialoadenitis of right submandibular gland 01/25/2021   Submandibular sialolithiasis 01/25/2021   Status post total hip replacement, left 08/12/2020   S/P hip replacement 04/08/2020   Restless legs 07/02/2018   Postmenopausal bleeding 07/02/2018   Lesion of vulva 07/02/2018   Nephrolithiasis 07/02/2018  Cataract 07/02/2018   Medicare annual wellness visit, initial 11/09/2017   Headache disorder 08/26/2017   Obesity (BMI 35.0-39.9 without comorbidity) 07/09/2017   Lichen sclerosus 07/09/2017   History of pulmonary embolus (PE) 07/09/2017   Chronic daily headache 07/09/2017   DVT of leg (deep venous thrombosis) (HCC) 08/17/2013   Hypoxia 08/16/2013   Pleuritic chest pain 08/15/2013   Acute pulmonary embolism (HCC) 08/15/2013   Arthropathy of pelvic region and thigh 07/03/2012   Arthritis, hip 07/03/2012    PCP: Marisue Ivan, MD   REFERRING PROVIDER: Visgauss, Patsi Sears, MD  REFERRING  DIAG: [id number] (ICD-10-CM) - Other specified joint disorders, right shoulder  THERAPY DIAG:  Acute pain of right shoulder  Stiffness of right shoulder, not elsewhere classified  Chronic right shoulder pain  Rationale for Evaluation and Treatment: Rehabilitation  ONSET DATE: S/P excision of R shoulder mass on 10/06/2022  SUBJECTIVE:                                                                                                                                                                                                         SUBJECTIVE STATEMENT:   Pt reports she is doing well and is not in any pain.  Pt notes that she had a good appointment with her surgeon yesterday.  Pt denies any complications at this time.  Pt notes that she is able to drive now, but her goal is to be able to pick up 20# as her granddaughter is 20#.     Hand dominance: Right  PERTINENT HISTORY:  S/P excision of R shoulder mass on 10/06/2022. R shoulder feels bet ter in general because she is currently not using her arm. 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 Per MD note: Gentle ROM, no active trap or deltoid engagement until 6 weeks post op. Has not yet had PT and feels like she is behind schedule. Wants to be able to lift 20 lbs (granddaughter) with both hands as well as to be able to drive.    BP is controlled per pt.  No latex bands  PAIN:  Are you having pain? Yes: NPRS scale: 0/10 Pain location: R superior shoulder Pain description: quick, fleeting pain Aggravating factors: moving her arm Relieving factors: rest, not moving her arm  PRECAUTIONS: Gentle ROM, no active trap or deltoid engagement until 6 weeks post op  RED FLAGS: No known red flags    WEIGHT BEARING RESTRICTIONS:   FALLS:  Has patient fallen in last 6 months? No  LIVING ENVIRONMENT: Lives with:  Lives in:  Stairs:  Has following equipment at home:   OCCUPATION:   PLOF: Independent  PATIENT GOALS: Wants to be  able to lift 20 lbs (granddaughter) with both hands as well as to be able to drive.   NEXT MD VISIT:   OBJECTIVE:  Note: Objective measures were completed at Evaluation unless otherwise noted.  DIAGNOSTIC FINDINGS:    PATIENT SURVEYS:  FOTO R shoulder FOTO 4 (11/06/2022)  COGNITION: Overall cognitive status: Within functional limits for tasks assessed  SENSATION:   POSTURE: forward neck, kyphosis, B protracted shoulders  PALPATION: Decreased scar tissue mobility/fascial restrictions palpated   UPPER EXTREMITY ROM:  Passive ROM Right eval Left eval  Shoulder flexion 100   Shoulder extension    Shoulder abduction 90   Shoulder adduction    Shoulder extension    Shoulder internal rotation 90   Shoulder external rotation 55   Elbow flexion    Elbow extension    Wrist flexion    Wrist extension    Wrist ulnar deviation    Wrist radial deviation    Wrist pronation    Wrist supination     (Blank rows = not tested)  UPPER EXTREMITY MMT:  MMT Right eval Left eval  Shoulder flexion    Shoulder extension    Shoulder abduction    Shoulder adduction    Shoulder extension    Shoulder internal rotation    Shoulder external rotation    Middle trapezius    Lower trapezius    Elbow flexion    Elbow extension    Wrist flexion    Wrist extension    Wrist ulnar deviation    Wrist radial deviation    Wrist pronation    Wrist supination    Grip strength     (Blank rows = not tested)    TODAY'S TREATMENT: DATE: 11/23/22   Therapeutic exercise   Seated pulley AAROM   Flexion 2x10  Scaption 2x10  Abduction 2x10  Standing scapular retractions at [place]ME[place] using bilateral handles, 10#, 2x10  Resisted ER lateral step outs with GTB and handle, 2x10 Resisted IR lateral step outs with GTB and handle, 2x10  Ball rolls up wall with AAROM with use of the L UE in order to alleviate pain with performance in the R UE, 2x10  Modified wall push ups, 2x10  Shoulder  circles at wall with ball, CW/CCW,  30 sec each direction  Supine chest press with PVC pipe, 2x12 Supine shoulder flexion with PVC pipe, 2x12 Supine serratus punch, 2x12    Improved exercise technique, movement at target joints, use of target muscles after mod verbal, visual, tactile cues.     PATIENT EDUCATION:  Education details: there-ex, HEP, POC Person educated: Patient Education method: Explanation, Demonstration, Tactile cues, Verbal cues, and Handouts Education comprehension: verbalized understanding and returned demonstration  HOME EXERCISE PROGRAM: Access Code: [email or web address] URL: [email or web address] Date: 11/06/2022 Prepared by: Loralyn Freshwater  Exercises - Seated Shoulder Flexion Towel Slide at Table Top  - 3 x daily - 7 x weekly - 3 sets - 10 reps - Seated Shoulder Abduction Towel Slide at Table Top  - 3 x daily - 7 x weekly - 3 sets - 10 reps - Seated Scapular Retraction  - 1 x daily - 7 x weekly - 3 sets - 10 reps - 5 seconds hold - Standing Isometric Shoulder External Rotation with Doorway  - 1 x daily - 7 x weekly - 3 sets - 10 reps - 5 seconds hold -  Standing Isometric Shoulder Internal Rotation at Doorway  - 1 x daily - 7 x weekly - 3 sets - 10 reps - 5 seconds hold - Standing Isometric Shoulder Flexion with Doorway - Arm Bent  - 1 x daily - 7 x weekly - 3 sets - 10 reps - 5 seconds hold - Standing Isometric Shoulder Abduction with Doorway  - 1 x daily - 7 x weekly - 3 sets - 10 reps - 5 seconds hold - Standing Isometric Shoulder Extension with Doorway - Arm Bent  - 1 x daily - 7 x weekly - 3 sets - 10 reps - 5 seconds hold   ASSESSMENT:    CLINICAL IMPRESSION:  Pt continues to respond favorably to the strengthening exercises and was introduced to more resistance during session.  Pt noted some of the exercises to be uncomfortable at the beginning, but as she progressed, it improved.  Pt overall performed well and is demonstrating improved  strengthening with all activities.   Pt will continue to benefit from skilled therapy to address remaining deficits in order to improve overall QoL and return to PLOF.          OBJECTIVE IMPAIRMENTS: decreased ROM, decreased strength, impaired flexibility, improper body mechanics, postural dysfunction, and pain.   ACTIVITY LIMITATIONS: carrying, lifting, bathing, toileting, dressing, reach over head, and hygiene/grooming  PARTICIPATION LIMITATIONS:   PERSONAL FACTORS: Age, Fitness, Past/current experiences, Time since onset of injury/illness/exacerbation, and 1-2 comorbidities: arthritis, pre-diabetes  are also affecting patient's functional outcome.   REHAB POTENTIAL: Fair    CLINICAL DECISION MAKING: Stable/uncomplicated  EVALUATION COMPLEXITY: Low   GOALS: Goals reviewed with patient? Yes  SHORT TERM GOALS: Target date: 11/24/2022  Pt will be independent with her initial HEP to improve ROM, strength, function, and ability to raise her arm and lift without pain or difficulty.  Baseline: Pt has started her initial HEP (11/06/2022) Goal status: INITIAL      LONG TERM GOALS: Target date: 01/04/2023  Pt will improve her R shoulder flexion and abduction AROM to 140 degrees or more and ER AROM to 65 degrees or more to promote ability to raise her arm with less difficulty up when appropriate.  Baseline:  Passive ROM Right eval  Shoulder flexion 100  Shoulder abduction 90  Shoulder internal rotation 90  Shoulder external rotation 55   (11/06/2022)  Goal status: INITIAL  2.  Pt will have at least 4/5 R shoulder flexion, abduction, ER, and IR to promote ability to use her R UE for functional tasks with less difficulty.  Baseline: R shoulder strength not yet tested secondary to precautions and to allow for more healing (11/06/2022) Goal status: INITIAL  3.  Pt will improve her R shoulder FOTO score by at least 10 points as a demonstration of improved function.  Baseline:  R shoulder FOTO 4 (11/06/2022) Goal status: INITIAL     PLAN:  PT FREQUENCY: 1-2x/week  PT DURATION: 8 weeks  PLANNED INTERVENTIONS: 97110-Therapeutic exercises, 97530- Therapeutic activity, [id number]- Neuromuscular re-education, 97140- Manual therapy, 97014- Electrical stimulation (unattended), 97033- Ionotophoresis 4mg /ml Dexamethasone, Patient/Family education, and Dry Needling  PLAN FOR NEXT SESSION: add to HEP.  posture, PROM, manual techniques, modalities PRN     Nolon Bussing, PT, DPT Physical Therapist - [facility]  [facility]  11/23/22, 11:13 AM

## 2022-11-27 ENCOUNTER — Ambulatory Visit: Payer: Medicare PPO

## 2022-11-27 DIAGNOSIS — M25511 Pain in right shoulder: Secondary | ICD-10-CM

## 2022-11-27 DIAGNOSIS — M25611 Stiffness of right shoulder, not elsewhere classified: Secondary | ICD-10-CM

## 2022-11-27 DIAGNOSIS — G8929 Other chronic pain: Secondary | ICD-10-CM

## 2022-11-27 NOTE — Therapy (Signed)
OUTPATIENT PHYSICAL THERAPY TREATMENT    Patient Name: Savannah Wilkins MRN: [id number] DOB:19-Feb-1950, 72 y.o., female Today's Date: 11/27/2022  END OF SESSION:  PT End of Session - 11/27/22 1057     Visit Number 5    Number of Visits 17    Date for PT Re-Evaluation 01/04/23    Authorization Time Period 11/06/22-01/12/23    PT Start Time 1030    PT Stop Time 1110    PT Time Calculation (min) 40 min    Activity Tolerance Patient tolerated treatment well    Behavior During Therapy [facility] for tasks assessed/performed                Past Medical History:  Diagnosis Date   Arthritis    DVT (deep venous thrombosis) (HCC) 2015   pulmonary emboli   GERD (gastroesophageal reflux disease)    occ   Headache    history of ileus 09/2020   History of kidney stones    Lichen sclerosus    Pneumonia 2015   PONV (postoperative nausea and vomiting)    Pre-diabetes    Pulmonary emboli (HCC) 2015   Restless leg syndrome    Past Surgical History:  Procedure Laterality Date   CATARACT EXTRACTION Bilateral 2013   CHOLECYSTECTOMY  1996   COLONOSCOPY  2015   CYST EXCISION Right 2010   pinkie finger   dcr Right    right eye   EXTRACORPOREAL SHOCK WAVE LITHOTRIPSY Bilateral 2006   twice on left and once on right   EXTRACORPOREAL SHOCK WAVE LITHOTRIPSY Right 07/25/2018   Procedure: EXTRACORPOREAL SHOCK WAVE LITHOTRIPSY (ESWL);  Surgeon: Sondra Come, MD;  Location: [facility];  Service: Urology;  Laterality: Right;   EYE SURGERY Bilateral 2013   cataract extractions   HYSTEROSCOPY  2015   IVC FILTER INSERTION N/A 04/06/2020   Procedure: IVC FILTER INSERTION;  Surgeon: Renford Dills, MD;  Location: [facility];  Service: Cardiovascular;  Laterality: N/A;   IVC FILTER REMOVAL N/A 10/26/2020   Procedure: IVC FILTER REMOVAL;  Surgeon: Renford Dills, MD;  Location: [facility];  Service: Cardiovascular;  Laterality: N/A;   IVC FILTER REMOVAL N/A 11/24/2020    Procedure: IVC FILTER REMOVAL;  Surgeon: Renford Dills, MD;  Location: [facility];  Service: Cardiovascular;  Laterality: N/A;   kidney stone removal Right 2006, 2013   right ureteroscopic stone removals   RETINAL TEAR REPAIR CRYOTHERAPY Bilateral    RIGHT 2007 AND 2008; LEFT 2012   SUBMANDIBULAR GLAND EXCISION Right 03/15/2021   Procedure: EXCISION SUBMANDIBULAR GLAND;  Surgeon: Bud Face, MD;  Location: [facility];  Service: ENT;  Laterality: Right;   TONSILLECTOMY  1984   TOTAL HIP ARTHROPLASTY Right 04/08/2020   Procedure: TOTAL HIP ARTHROPLASTY ANTERIOR APPROACH;  Surgeon: Kennedy Bucker, MD;  Location: [facility];  Service: Orthopedics;  Laterality: Right;   TOTAL HIP ARTHROPLASTY Left 08/12/2020   Procedure: TOTAL HIP ARTHROPLASTY ANTERIOR APPROACH;  Surgeon: Kennedy Bucker, MD;  Location: [facility];  Service: Orthopedics;  Laterality: Left;   TUBAL LIGATION  1985   Patient Active Problem List   Diagnosis Date Noted   Infectious sialoadenitis of right submandibular gland 01/25/2021   Submandibular sialolithiasis 01/25/2021   Status post total hip replacement, left 08/12/2020   S/P hip replacement 04/08/2020   Restless legs 07/02/2018   Postmenopausal bleeding 07/02/2018   Lesion of vulva 07/02/2018   Nephrolithiasis 07/02/2018   Cataract 07/02/2018   Medicare annual wellness  visit, initial 11/09/2017   Headache disorder 08/26/2017   Obesity (BMI 35.0-39.9 without comorbidity) 07/09/2017   Lichen sclerosus 07/09/2017   History of pulmonary embolus (PE) 07/09/2017   Chronic daily headache 07/09/2017   DVT of leg (deep venous thrombosis) (HCC) 08/17/2013   Hypoxia 08/16/2013   Pleuritic chest pain 08/15/2013   Acute pulmonary embolism (HCC) 08/15/2013   Arthropathy of pelvic region and thigh 07/03/2012   Arthritis, hip 07/03/2012    PCP: Marisue Ivan, MD   REFERRING PROVIDER: Visgauss, Patsi Sears, MD  REFERRING DIAG: [phone] (ICD-10-CM) - Other  specified joint disorders, right shoulder  THERAPY DIAG:  Acute pain of right shoulder  Stiffness of right shoulder, not elsewhere classified  Chronic right shoulder pain  Rationale for Evaluation and Treatment: Rehabilitation  ONSET DATE: S/P excision of R shoulder mass on 10/06/2022  SUBJECTIVE:                                                                                                                                                                                                         SUBJECTIVE STATEMENT:   No updates since here last. HEP compliant for the most part.   PERTINENT HISTORY:  S/P excision of R shoulder mass on 10/06/2022. R shoulder feels bet ter in general because she is currently not using her arm. 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 Per MD note: Gentle ROM, no active trap or deltoid engagement until 6 weeks post op. Has not yet had PT and feels like she is behind schedule. Wants to be able to lift 20 lbs (granddaughter) with both hands as well as to be able to drive.    BP is controlled per pt.  No latex bands  PAIN:  Are you having pain? No pain unless moving her arm the wrong way   PRECAUTIONS: Gentle ROM, no active trap or deltoid engagement until 6 weeks post op  RED FLAGS: No known red flags    WEIGHT BEARING RESTRICTIONS:   FALLS:  Has patient fallen in last 6 months? No  LIVING ENVIRONMENT: Lives with:  Lives in:  Stairs:  Has following equipment at home:   OCCUPATION:   PLOF: Independent  PATIENT GOALS: Wants to be able to lift 20 lbs (granddaughter) with both hands as well as to be able to drive.   NEXT MD VISIT:   OBJECTIVE:   TODAY'S TREATMENT: DATE: 11/27/22  Seated pulley AAROM   Flexion 2x10  Scaption 2x10  Abduction 2x10 Standing scapular retractions at [facility] using bilateral handles, 10#, 2x10 Isometric  IR ball squeeze 2x15  Wall pushup 1x15  Shoulder ER in scaption, elbow supported 1x10, then IR with  manual resistance x10  Wall circless on yoga ball 15 CW, 15 CCW  Flexion to 90 to horizontal ABDCT to 90, then reverse  Wall circless on yoga ball 15 CW, 15 CCW  Rt elbow fleixon 1x0 @ 4lb  Heat to shoulder      PATIENT EDUCATION:  Education details: there-ex, HEP, POC Person educated: Patient Education method: Explanation, Demonstration, Tactile cues, Verbal cues, and Handouts Education comprehension: verbalized understanding and returned demonstration  HOME EXERCISE PROGRAM: Access Code: [email or web address] URL: [email or web address] Date: 11/06/2022 Prepared by: Loralyn Freshwater  Exercises - Seated Shoulder Flexion Towel Slide at Table Top  - 3 x daily - 7 x weekly - 3 sets - 10 reps - Seated Shoulder Abduction Towel Slide at Table Top  - 3 x daily - 7 x weekly - 3 sets - 10 reps - Seated Scapular Retraction  - 1 x daily - 7 x weekly - 3 sets - 10 reps - 5 seconds hold - Standing Isometric Shoulder External Rotation with Doorway  - 1 x daily - 7 x weekly - 3 sets - 10 reps - 5 seconds hold - Standing Isometric Shoulder Internal Rotation at Doorway  - 1 x daily - 7 x weekly - 3 sets - 10 reps - 5 seconds hold - Standing Isometric Shoulder Flexion with Doorway - Arm Bent  - 1 x daily - 7 x weekly - 3 sets - 10 reps - 5 seconds hold - Standing Isometric Shoulder Abduction with Doorway  - 1 x daily - 7 x weekly - 3 sets - 10 reps - 5 seconds hold - Standing Isometric Shoulder Extension with Doorway - Arm Bent  - 1 x daily - 7 x weekly - 3 sets - 10 reps - 5 seconds hold   ASSESSMENT:    CLINICAL IMPRESSION: Pt continues to respond favorably to the strengthening  and A/ROM exercises and was introduced to more resistance during session. Pt still has mechanical catch/click/pop at certain angles that is disconcerting. Pt overall performed well and is demonstrating improved strengthening with all activities.   Pt will continue to benefit from skilled therapy to address remaining  deficits in order to improve overall QoL and return to PLOF.       OBJECTIVE IMPAIRMENTS: decreased ROM, decreased strength, impaired flexibility, improper body mechanics, postural dysfunction, and pain.   ACTIVITY LIMITATIONS: carrying, lifting, bathing, toileting, dressing, reach over head, and hygiene/grooming  PARTICIPATION LIMITATIONS:   PERSONAL FACTORS: Age, Fitness, Past/current experiences, Time since onset of injury/illness/exacerbation, and 1-2 comorbidities: arthritis, pre-diabetes  are also affecting patient's functional outcome.   REHAB POTENTIAL: Fair    CLINICAL DECISION MAKING: Stable/uncomplicated  EVALUATION COMPLEXITY: Low   GOALS: Goals reviewed with patient? Yes  SHORT TERM GOALS: Target date: 11/24/2022  Pt will be independent with her initial HEP to improve ROM, strength, function, and ability to raise her arm and lift without pain or difficulty.  Baseline: Pt has started her initial HEP (11/06/2022) Goal status: INITIAL      LONG TERM GOALS: Target date: 01/04/2023  Pt will improve her R shoulder flexion and abduction AROM to 140 degrees or more and ER AROM to 65 degrees or more to promote ability to raise her arm with less difficulty up when appropriate.  Baseline:  Passive ROM Right eval  Shoulder flexion 100  Shoulder abduction 90  Shoulder internal rotation 90  Shoulder external rotation 55   (11/06/2022)  Goal status: INITIAL  2.  Pt will have at least 4/5 R shoulder flexion, abduction, ER, and IR to promote ability to use her R UE for functional tasks with less difficulty.  Baseline: R shoulder strength not yet tested secondary to precautions and to allow for more healing (11/06/2022) Goal status: INITIAL  3.  Pt will improve her R shoulder FOTO score by at least 10 points as a demonstration of improved function.  Baseline: R shoulder FOTO 4 (11/06/2022) Goal status: INITIAL     PLAN:  PT FREQUENCY: 1-2x/week  PT DURATION: 8  weeks  PLANNED INTERVENTIONS: 97110-Therapeutic exercises, 97530- Therapeutic activity, [id number]- Neuromuscular re-education, 97140- Manual therapy, 97014- Electrical stimulation (unattended), 97033- Ionotophoresis 4mg /ml Dexamethasone, Patient/Family education, and Dry Needling  PLAN FOR NEXT SESSION: add to HEP.  posture, PROM, manual techniques, modalities PRN    10:59 AM, 11/27/22 Rosamaria Lints, PT, DPT Physical Therapist - [place] [phone] (Office)

## 2022-11-29 ENCOUNTER — Ambulatory Visit: Payer: Medicare PPO

## 2022-11-29 DIAGNOSIS — M25511 Pain in right shoulder: Secondary | ICD-10-CM

## 2022-11-29 DIAGNOSIS — M25611 Stiffness of right shoulder, not elsewhere classified: Secondary | ICD-10-CM

## 2022-11-29 DIAGNOSIS — G8929 Other chronic pain: Secondary | ICD-10-CM

## 2022-11-29 NOTE — Therapy (Signed)
OUTPATIENT PHYSICAL THERAPY TREATMENT    Patient Name: Savannah Wilkins MRN: [id number] DOB:Nov 10, 1950, 72 y.o., female Today's Date: 11/29/2022  END OF SESSION:  PT End of Session - 11/29/22 0900     Visit Number 6    Number of Visits 17    Date for PT Re-Evaluation 01/04/23    Authorization Time Period 11/06/22-01/12/23    PT Start Time 0900    PT Stop Time 0945    PT Time Calculation (min) 45 min    Activity Tolerance Patient tolerated treatment well    Behavior During Therapy [facility] for tasks assessed/performed                 Past Medical History:  Diagnosis Date   Arthritis    DVT (deep venous thrombosis) (HCC) 2015   pulmonary emboli   GERD (gastroesophageal reflux disease)    occ   Headache    history of ileus 09/2020   History of kidney stones    Lichen sclerosus    Pneumonia 2015   PONV (postoperative nausea and vomiting)    Pre-diabetes    Pulmonary emboli (HCC) 2015   Restless leg syndrome    Past Surgical History:  Procedure Laterality Date   CATARACT EXTRACTION Bilateral 2013   CHOLECYSTECTOMY  1996   COLONOSCOPY  2015   CYST EXCISION Right 2010   pinkie finger   dcr Right    right eye   EXTRACORPOREAL SHOCK WAVE LITHOTRIPSY Bilateral 2006   twice on left and once on right   EXTRACORPOREAL SHOCK WAVE LITHOTRIPSY Right 07/25/2018   Procedure: EXTRACORPOREAL SHOCK WAVE LITHOTRIPSY (ESWL);  Surgeon: Sondra Come, MD;  Location: [facility];  Service: Urology;  Laterality: Right;   EYE SURGERY Bilateral 2013   cataract extractions   HYSTEROSCOPY  2015   IVC FILTER INSERTION N/A 04/06/2020   Procedure: IVC FILTER INSERTION;  Surgeon: Renford Dills, MD;  Location: [facility];  Service: Cardiovascular;  Laterality: N/A;   IVC FILTER REMOVAL N/A 10/26/2020   Procedure: IVC FILTER REMOVAL;  Surgeon: Renford Dills, MD;  Location: [facility];  Service: Cardiovascular;  Laterality: N/A;   IVC FILTER REMOVAL N/A 11/24/2020    Procedure: IVC FILTER REMOVAL;  Surgeon: Renford Dills, MD;  Location: [facility];  Service: Cardiovascular;  Laterality: N/A;   kidney stone removal Right 2006, 2013   right ureteroscopic stone removals   RETINAL TEAR REPAIR CRYOTHERAPY Bilateral    RIGHT 2007 AND 2008; LEFT 2012   SUBMANDIBULAR GLAND EXCISION Right 03/15/2021   Procedure: EXCISION SUBMANDIBULAR GLAND;  Surgeon: Bud Face, MD;  Location: [facility];  Service: ENT;  Laterality: Right;   TONSILLECTOMY  1984   TOTAL HIP ARTHROPLASTY Right 04/08/2020   Procedure: TOTAL HIP ARTHROPLASTY ANTERIOR APPROACH;  Surgeon: Kennedy Bucker, MD;  Location: [facility];  Service: Orthopedics;  Laterality: Right;   TOTAL HIP ARTHROPLASTY Left 08/12/2020   Procedure: TOTAL HIP ARTHROPLASTY ANTERIOR APPROACH;  Surgeon: Kennedy Bucker, MD;  Location: [facility];  Service: Orthopedics;  Laterality: Left;   TUBAL LIGATION  1985   Patient Active Problem List   Diagnosis Date Noted   Infectious sialoadenitis of right submandibular gland 01/25/2021   Submandibular sialolithiasis 01/25/2021   Status post total hip replacement, left 08/12/2020   S/P hip replacement 04/08/2020   Restless legs 07/02/2018   Postmenopausal bleeding 07/02/2018   Lesion of vulva 07/02/2018   Nephrolithiasis 07/02/2018   Cataract 07/02/2018   Medicare annual  wellness visit, initial 11/09/2017   Headache disorder 08/26/2017   Obesity (BMI 35.0-39.9 without comorbidity) 07/09/2017   Lichen sclerosus 07/09/2017   History of pulmonary embolus (PE) 07/09/2017   Chronic daily headache 07/09/2017   DVT of leg (deep venous thrombosis) (HCC) 08/17/2013   Hypoxia 08/16/2013   Pleuritic chest pain 08/15/2013   Acute pulmonary embolism (HCC) 08/15/2013   Arthropathy of pelvic region and thigh 07/03/2012   Arthritis, hip 07/03/2012    PCP: Marisue Ivan, MD   REFERRING PROVIDER: Visgauss, Patsi Sears, MD  REFERRING DIAG: [phone] (ICD-10-CM) - Other  specified joint disorders, right shoulder  THERAPY DIAG:  Acute pain of right shoulder  Stiffness of right shoulder, not elsewhere classified  Chronic right shoulder pain  Rationale for Evaluation and Treatment: Rehabilitation  ONSET DATE: S/P excision of R shoulder mass on 10/06/2022  SUBJECTIVE:                                                                                                                                                                                                         SUBJECTIVE STATEMENT:  Pt reports she is doing well, noting pain only when she does something wrong.  Pt still reporting the "catch" in the shoulder when performing certain movement   PERTINENT HISTORY:  S/P excision of R shoulder mass on 10/06/2022. R shoulder feels bet ter in general because she is currently not using her arm. 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 Per MD note: Gentle ROM, no active trap or deltoid engagement until 6 weeks post op. Has not yet had PT and feels like she is behind schedule. Wants to be able to lift 20 lbs (granddaughter) with both hands as well as to be able to drive.    BP is controlled per pt.  No latex bands  PAIN:  Are you having pain? No pain unless moving her arm the wrong way   PRECAUTIONS: Gentle ROM, no active trap or deltoid engagement until 6 weeks post op  RED FLAGS: No known red flags    WEIGHT BEARING RESTRICTIONS:   FALLS:  Has patient fallen in last 6 months? No  LIVING ENVIRONMENT: Lives with:  Lives in:  Stairs:  Has following equipment at home:   OCCUPATION:   PLOF: Independent  PATIENT GOALS: Wants to be able to lift 20 lbs (granddaughter) with both hands as well as to be able to drive.   NEXT MD VISIT:   OBJECTIVE:   TODAY'S TREATMENT: DATE: 11/29/22   TherEx:  Seated pulley AAROM  Flexion 2x10  Scaption 2x10  Abduction 2x10  Standing scapular retractions at [facility] using bilateral handles, 10#,  2x10 Isometric IR ball squeeze 2x15  Modified wall pushup 2x15, second set standing further away from wall Wall circles with green physioball, 30 sec bouts each direction, CW/CCW Standing shoulder extension with PVC, 2x10 Standing posterior internal rotation with PVC, 2x10  MHP applied for supine exercises: Supine serratus punches with 3# dumbbells, 2x10 Supine shoulder ER in scaption, elbow supported 2x10, then IR with manual resistance x10  Supine shoulder flexion to 90 to horizontal  Supine abduction to 90, x5 but unable to complete full set due to pain     PATIENT EDUCATION:  Education details: there-ex, HEP, POC Person educated: Patient Education method: Explanation, Demonstration, Tactile cues, Verbal cues, and Handouts Education comprehension: verbalized understanding and returned demonstration  HOME EXERCISE PROGRAM: Access Code: [email or web address] URL: [email or web address] Date: 11/06/2022 Prepared by: Loralyn Freshwater & Tomasa Hose  Exercises  - Standing Isometric Shoulder External Rotation with Doorway  - 1 x daily - 7 x weekly - 3 sets - 10 reps - 5 seconds hold - Standing Isometric Shoulder Internal Rotation at Doorway  - 1 x daily - 7 x weekly - 3 sets - 10 reps - 5 seconds hold - Standing Isometric Shoulder Flexion with Doorway - Arm Bent  - 1 x daily - 7 x weekly - 3 sets - 10 reps - 5 seconds hold - Standing Isometric Shoulder Abduction with Doorway  - 1 x daily - 7 x weekly - 3 sets - 10 reps - 5 seconds hold - Standing Isometric Shoulder Extension with Doorway - Arm Bent  - 1 x daily - 7 x weekly - 3 sets - 10 reps - 5 seconds hold - Standing Bilateral Shoulder Internal Rotation AAROM with Dowel  - 1 x daily - 7 x weekly - 3 sets - 10 reps - Standing Shoulder Extension ROM with Dowel  - 1 x daily - 7 x weekly - 3 sets - 10 reps - Wall Push Up  - 1 x daily - 7 x weekly - 3 sets - 10 reps  ASSESSMENT:  CLINICAL IMPRESSION:  Pt continues to respond well to  the given exercises, noting no increased pain while performing.  Pt able to progress with the strengthening exercises by performing exercises with increased resistance.  Pt noted to tolerate increased distance away from the wall while performing wall pushups, making the exercise more challenging.  HEP was updated to reflect new strengthening exercises and to improve overall ROM.  Pt will continue to benefit from skilled therapy to address remaining deficits in order to improve overall QoL and return to PLOF.       OBJECTIVE IMPAIRMENTS: decreased ROM, decreased strength, impaired flexibility, improper body mechanics, postural dysfunction, and pain.   ACTIVITY LIMITATIONS: carrying, lifting, bathing, toileting, dressing, reach over head, and hygiene/grooming  PARTICIPATION LIMITATIONS:   PERSONAL FACTORS: Age, Fitness, Past/current experiences, Time since onset of injury/illness/exacerbation, and 1-2 comorbidities: arthritis, pre-diabetes  are also affecting patient's functional outcome.   REHAB POTENTIAL: Fair    CLINICAL DECISION MAKING: Stable/uncomplicated  EVALUATION COMPLEXITY: Low   GOALS: Goals reviewed with patient? Yes  SHORT TERM GOALS: Target date: 11/24/2022  Pt will be independent with her initial HEP to improve ROM, strength, function, and ability to raise her arm and lift without pain or difficulty.  Baseline: Pt has started her initial HEP (11/06/2022) Goal status: INITIAL      LONG TERM  GOALS: Target date: 01/04/2023  Pt will improve her R shoulder flexion and abduction AROM to 140 degrees or more and ER AROM to 65 degrees or more to promote ability to raise her arm with less difficulty up when appropriate.  Baseline:  Passive ROM Right eval  Shoulder flexion 100  Shoulder abduction 90  Shoulder internal rotation 90  Shoulder external rotation 55   (11/06/2022)  Goal status: INITIAL  2.  Pt will have at least 4/5 R shoulder flexion, abduction, ER, and  IR to promote ability to use her R UE for functional tasks with less difficulty.  Baseline: R shoulder strength not yet tested secondary to precautions and to allow for more healing (11/06/2022) Goal status: INITIAL  3.  Pt will improve her R shoulder FOTO score by at least 10 points as a demonstration of improved function.  Baseline: R shoulder FOTO 4 (11/06/2022) Goal status: INITIAL     PLAN:  PT FREQUENCY: 1-2x/week  PT DURATION: 8 weeks  PLANNED INTERVENTIONS: 97110-Therapeutic exercises, 97530- Therapeutic activity, [id number]- Neuromuscular re-education, 97140- Manual therapy, 97014- Electrical stimulation (unattended), 97033- Ionotophoresis 4mg /ml Dexamethasone, Patient/Family education, and Dry Needling  PLAN FOR NEXT SESSION:  add to HEP.  posture, PROM, manual techniques, modalities PRN   Nolon Bussing, PT, DPT Physical Therapist - [place]  [facility]  11/29/22, 9:00 AM

## 2022-12-04 ENCOUNTER — Ambulatory Visit: Payer: Medicare PPO

## 2022-12-04 DIAGNOSIS — M25511 Pain in right shoulder: Secondary | ICD-10-CM

## 2022-12-04 DIAGNOSIS — M25611 Stiffness of right shoulder, not elsewhere classified: Secondary | ICD-10-CM

## 2022-12-04 DIAGNOSIS — G8929 Other chronic pain: Secondary | ICD-10-CM

## 2022-12-04 NOTE — Therapy (Signed)
OUTPATIENT PHYSICAL THERAPY TREATMENT    Patient Name: Savannah Wilkins MRN: [id number] DOB:02/04/1950, 72 y.o., female Today's Date: 12/04/2022  END OF SESSION:  PT End of Session - 12/04/22 0736     Visit Number 7    Number of Visits 17    Date for PT Re-Evaluation 01/04/23    Authorization Time Period 11/06/22-01/12/23    PT Start Time 0734    PT Stop Time 0815    PT Time Calculation (min) 41 min    Activity Tolerance Patient tolerated treatment well    Behavior During Therapy [facility] for tasks assessed/performed                 Past Medical History:  Diagnosis Date   Arthritis    DVT (deep venous thrombosis) (HCC) 2015   pulmonary emboli   GERD (gastroesophageal reflux disease)    occ   Headache    history of ileus 09/2020   History of kidney stones    Lichen sclerosus    Pneumonia 2015   PONV (postoperative nausea and vomiting)    Pre-diabetes    Pulmonary emboli (HCC) 2015   Restless leg syndrome    Past Surgical History:  Procedure Laterality Date   CATARACT EXTRACTION Bilateral 2013   CHOLECYSTECTOMY  1996   COLONOSCOPY  2015   CYST EXCISION Right 2010   pinkie finger   dcr Right    right eye   EXTRACORPOREAL SHOCK WAVE LITHOTRIPSY Bilateral 2006   twice on left and once on right   EXTRACORPOREAL SHOCK WAVE LITHOTRIPSY Right 07/25/2018   Procedure: EXTRACORPOREAL SHOCK WAVE LITHOTRIPSY (ESWL);  Surgeon: Sondra Come, MD;  Location: [facility] ORS;  Service: Urology;  Laterality: Right;   EYE SURGERY Bilateral 2013   cataract extractions   HYSTEROSCOPY  2015   IVC FILTER INSERTION N/A 04/06/2020   Procedure: IVC FILTER INSERTION;  Surgeon: Renford Dills, MD;  Location: [facility] INVASIVE CV LAB;  Service: Cardiovascular;  Laterality: N/A;   IVC FILTER REMOVAL N/A 10/26/2020   Procedure: IVC FILTER REMOVAL;  Surgeon: Renford Dills, MD;  Location: [facility] INVASIVE CV [facility];  Service: Cardiovascular;  Laterality: N/A;   IVC FILTER REMOVAL N/A 11/24/2020    Procedure: IVC FILTER REMOVAL;  Surgeon: Renford Dills, MD;  Location: [facility] INVASIVE CV LAB;  Service: Cardiovascular;  Laterality: N/A;   kidney stone removal Right 2006, 2013   right ureteroscopic stone removals   RETINAL TEAR REPAIR CRYOTHERAPY Bilateral    RIGHT 2007 AND 2008; LEFT 2012   SUBMANDIBULAR GLAND EXCISION Right 03/15/2021   Procedure: EXCISION SUBMANDIBULAR GLAND;  Surgeon: Bud Face, MD;  Location: [facility] ORS;  Service: ENT;  Laterality: Right;   TONSILLECTOMY  1984   TOTAL HIP ARTHROPLASTY Right 04/08/2020   Procedure: TOTAL HIP ARTHROPLASTY ANTERIOR APPROACH;  Surgeon: Kennedy Bucker, MD;  Location: [facility] ORS;  Service: Orthopedics;  Laterality: Right;   TOTAL HIP ARTHROPLASTY Left 08/12/2020   Procedure: TOTAL HIP ARTHROPLASTY ANTERIOR APPROACH;  Surgeon: Kennedy Bucker, MD;  Location: [facility] OR[facility];  Service: Orthopedics;  Laterality: Left;   TUBAL LIGATION  1985   Patient Active Problem List   Diagnosis Date Noted   Infectious sialoadenitis of right submandibular gland 01/25/2021   Submandibular sialolithiasis 01/25/2021   Status post total hip replacement, left 08/12/2020   S/P hip replacement 04/08/2020   Restless legs 07/02/2018   Postmenopausal bleeding 07/02/2018   Lesion of vulva 07/02/2018   Nephrolithiasis 07/02/2018   Cataract 07/02/2018   Medicare annual  wellness visit, initial 11/09/2017   Headache disorder 08/26/2017   Obesity (BMI 35.0-39.9 without comorbidity) 07/09/2017   Lichen sclerosus 07/09/2017   History of pulmonary embolus (PE) 07/09/2017   Chronic daily headache 07/09/2017   DVT of leg (deep venous thrombosis) (HCC) 08/17/2013   Hypoxia 08/16/2013   Pleuritic chest pain 08/15/2013   Acute pulmonary embolism (HCC) 08/15/2013   Arthropathy of pelvic region and thigh 07/03/2012   Arthritis, hip 07/03/2012    PCP: Marisue Ivan, MD   REFERRING PROVIDER: Visgauss, Patsi Sears, MD  REFERRING DIAG: [phone] (ICD-10-CM) - Other  specified joint disorders, right shoulder  THERAPY DIAG:  Acute pain of right shoulder  Stiffness of right shoulder, not elsewhere classified  Chronic right shoulder pain  Rationale for Evaluation and Treatment: Rehabilitation  ONSET DATE: S/P excision of R shoulder mass on 10/06/2022  SUBJECTIVE:                                                                                                                                                                                                         SUBJECTIVE STATEMENT:  Pt reports she had a little pain when picking up her granddaughter over the weekend, but it did not hurt nearly as bad as expected.  Pt reports 1/10 pain today, noting some tightness and it being uncomfortable.   PERTINENT HISTORY:  S/P excision of R shoulder mass on 10/06/2022. R shoulder feels bet ter in general because she is currently not using her arm. 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 Per MD note: Gentle ROM, no active trap or deltoid engagement until 6 weeks post op. Has not yet had PT and feels like she is behind schedule. Wants to be able to lift 20 lbs (granddaughter) with both hands as well as to be able to drive.    BP is controlled per pt.  No latex bands  PAIN:  Are you having pain? No pain unless moving her arm the wrong way   PRECAUTIONS: Gentle ROM, no active trap or deltoid engagement until 6 weeks post op  RED FLAGS: No known red flags    WEIGHT BEARING RESTRICTIONS:   FALLS:  Has patient fallen in last 6 months? No  LIVING ENVIRONMENT: Lives with:  Lives in:  Stairs:  Has following equipment at home:   OCCUPATION:   PLOF: Independent  PATIENT GOALS: Wants to be able to lift 20 lbs (granddaughter) with both hands as well as to be able to drive.   NEXT MD VISIT:   OBJECTIVE:   TODAY'S TREATMENT:  DATE: 12/04/22   TherEx:  Supine chest press with PVC pipe, 2x10 Supine flexion with PVC pipe to end ranged,  2x10 Supine ER of the R shoulder with PVC and 1-2 sec hold, 2x10 Supine serratus punch with 2KG weighted ball, 2x10 Supine arm circles with 2KG weighted ball, CW/CCW, 30 sec bouts Standing scapular retractions at OMEGA using bilateral handles, 15#, 2x10 Modified wall pushup 2x15, second set standing further away from wall Isometric IR ball squeeze 2x15  Wall circles with green physioball, 30 sec bouts each direction, CW/CCW Standing shoulder extension with PVC, 2x10 Standing posterior internal rotation with PVC, 2x10 Seated chest press, 5#, 4x2, pt unable to perform >4 reps at a time due to discomfort/pain in the deltoid  MHP applied to shoulder at end of session for 5 min for pain modulation, unbilled     PATIENT EDUCATION:  Education details: there-ex, HEP, POC Person educated: Patient Education method: Explanation, Demonstration, Tactile cues, Verbal cues, and Handouts Education comprehension: verbalized understanding and returned demonstration   HOME EXERCISE PROGRAM: Access Code: [email or web address] URL: [email or web address] Date: 11/06/2022 Prepared by: Loralyn Freshwater & Tomasa Hose  Exercises  - Standing Isometric Shoulder External Rotation with Doorway  - 1 x daily - 7 x weekly - 3 sets - 10 reps - 5 seconds hold - Standing Isometric Shoulder Internal Rotation at Doorway  - 1 x daily - 7 x weekly - 3 sets - 10 reps - 5 seconds hold - Standing Isometric Shoulder Flexion with Doorway - Arm Bent  - 1 x daily - 7 x weekly - 3 sets - 10 reps - 5 seconds hold - Standing Isometric Shoulder Abduction with Doorway  - 1 x daily - 7 x weekly - 3 sets - 10 reps - 5 seconds hold - Standing Isometric Shoulder Extension with Doorway - Arm Bent  - 1 x daily - 7 x weekly - 3 sets - 10 reps - 5 seconds hold - Standing Bilateral Shoulder Internal Rotation AAROM with Dowel  - 1 x daily - 7 x weekly - 3 sets - 10 reps - Standing Shoulder Extension ROM with Dowel  - 1 x daily - 7 x weekly -  3 sets - 10 reps - Wall Push Up  - 1 x daily - 7 x weekly - 3 sets - 10 reps    ASSESSMENT:  CLINICAL IMPRESSION:  Pt continued to respond well to the exercises, noting only some mild discomfort with the seated chest press.  Pt notes the pain to only start around th 4th rep, but is unable to continue past that point.  Pt continues to demonstrate weakness associated with pain at this point.  Therapist to attempt manual therapy at next visit to assist with pain levels and to improve overall ROM of the R shoulder.   Pt will continue to benefit from skilled therapy to address remaining deficits in order to improve overall QoL and return to PLOF.        OBJECTIVE IMPAIRMENTS: decreased ROM, decreased strength, impaired flexibility, improper body mechanics, postural dysfunction, and pain.   ACTIVITY LIMITATIONS: carrying, lifting, bathing, toileting, dressing, reach over head, and hygiene/grooming  PARTICIPATION LIMITATIONS:   PERSONAL FACTORS: Age, Fitness, Past/current experiences, Time since onset of injury/illness/exacerbation, and 1-2 comorbidities: arthritis, pre-diabetes  are also affecting patient's functional outcome.   REHAB POTENTIAL: Fair    CLINICAL DECISION MAKING: Stable/uncomplicated  EVALUATION COMPLEXITY: Low   GOALS: Goals reviewed with patient? Yes  SHORT TERM GOALS: Target date: 11/24/2022  Pt will be independent with her initial HEP to improve ROM, strength, function, and ability to raise her arm and lift without pain or difficulty.  Baseline: Pt has started her initial HEP (11/06/2022) Goal status: INITIAL   LONG TERM GOALS: Target date: 01/04/2023  Pt will improve her R shoulder flexion and abduction AROM to 140 degrees or more and ER AROM to 65 degrees or more to promote ability to raise her arm with less difficulty up when appropriate.  Baseline:  Passive ROM Right eval  Shoulder flexion 100  Shoulder abduction 90  Shoulder internal rotation 90   Shoulder external rotation 55   (11/06/2022)  Goal status: INITIAL  2.  Pt will have at least 4/5 R shoulder flexion, abduction, ER, and IR to promote ability to use her R UE for functional tasks with less difficulty.  Baseline: R shoulder strength not yet tested secondary to precautions and to allow for more healing (11/06/2022) Goal status: INITIAL  3.  Pt will improve her R shoulder FOTO score by at least 10 points as a demonstration of improved function.  Baseline: R shoulder FOTO 4 (11/06/2022) Goal status: INITIAL     PLAN:  PT FREQUENCY: 1-2x/week  PT DURATION: 8 weeks  PLANNED INTERVENTIONS: 97110-Therapeutic exercises, 97530- Therapeutic activity, [id number]- Neuromuscular re-education, 97140- Manual therapy, 97014- Electrical stimulation (unattended), 97033- Ionotophoresis 4mg /ml Dexamethasone, Patient/Family education, and Dry Needling  PLAN FOR NEXT SESSION:  perform manual therapy, add to HEP.  posture, PROM, manual techniques, modalities PRN   Nolon Bussing, PT, DPT Physical Therapist - [facility]  [facility] [place]  12/04/22, 8:19 AM

## 2022-12-06 ENCOUNTER — Ambulatory Visit: Payer: Medicare PPO

## 2022-12-06 DIAGNOSIS — G8929 Other chronic pain: Secondary | ICD-10-CM

## 2022-12-06 DIAGNOSIS — M25511 Pain in right shoulder: Secondary | ICD-10-CM

## 2022-12-06 DIAGNOSIS — M25611 Stiffness of right shoulder, not elsewhere classified: Secondary | ICD-10-CM

## 2022-12-06 NOTE — Therapy (Signed)
OUTPATIENT PHYSICAL THERAPY TREATMENT    Patient Name: Savannah Wilkins MRN: [id number] DOB:[birth year], 72 y.o., female Today's Date: 12/06/2022  END OF SESSION:  PT End of Session - 12/06/22 0934     Visit Number 8    Number of Visits 17    Date for PT Re-Evaluation 01/04/23    Authorization Time Period 11/06/22-01/12/23    PT Start Time 0940    PT Stop Time 1020    PT Time Calculation (min) 40 min    Activity Tolerance Patient tolerated treatment well    Behavior During Therapy [facility] for tasks assessed/performed                 Past Medical History:  Diagnosis Date   Arthritis    DVT (deep venous thrombosis) (HCC) 2015   pulmonary emboli   GERD (gastroesophageal reflux disease)    occ   Headache    history of ileus 09/2020   History of kidney stones    Lichen sclerosus    Pneumonia 2015   PONV (postoperative nausea and vomiting)    Pre-diabetes    Pulmonary emboli (HCC) 2015   Restless leg syndrome    Past Surgical History:  Procedure Laterality Date   CATARACT EXTRACTION Bilateral 2013   CHOLECYSTECTOMY  1996   COLONOSCOPY  2015   CYST EXCISION Right 2010   pinkie finger   dcr Right    right eye   EXTRACORPOREAL SHOCK WAVE LITHOTRIPSY Bilateral 2006   twice on left and once on right   EXTRACORPOREAL SHOCK WAVE LITHOTRIPSY Right 07/25/2018   Procedure: EXTRACORPOREAL SHOCK WAVE LITHOTRIPSY (ESWL);  Surgeon: Sondra Come, MD;  Location: [facility] OR[facility];  Service: Urology;  Laterality: Right;   EYE SURGERY Bilateral 2013   cataract extractions   HYSTEROSCOPY  2015   IVC FILTER INSERTION N/A 04/06/2020   Procedure: IVC FILTER INSERTION;  Surgeon: Renford Dills, MD;  Location: [facility];  Service: Cardiovascular;  Laterality: N/A;   IVC FILTER REMOVAL N/A 10/26/2020   Procedure: IVC FILTER REMOVAL;  Surgeon: Renford Dills, MD;  Location: [facility];  Service: Cardiovascular;  Laterality: N/A;   IVC FILTER REMOVAL N/A 11/24/2020    Procedure: IVC FILTER REMOVAL;  Surgeon: Renford Dills, MD;  Location: [facility];  Service: Cardiovascular;  Laterality: N/A;   kidney stone removal Right 2006, 2013   right ureteroscopic stone removals   RETINAL TEAR REPAIR CRYOTHERAPY Bilateral    RIGHT 2007 AND 2008; LEFT 2012   SUBMANDIBULAR GLAND EXCISION Right 03/15/2021   Procedure: EXCISION SUBMANDIBULAR GLAND;  Surgeon: Bud Face, MD;  Location: [facility] OR[facility];  Service: ENT;  Laterality: Right;   TONSILLECTOMY  1984   TOTAL HIP ARTHROPLASTY Right 04/08/2020   Procedure: TOTAL HIP ARTHROPLASTY ANTERIOR APPROACH;  Surgeon: Kennedy Bucker, MD;  Location: [facility];  Service: Orthopedics;  Laterality: Right;   TOTAL HIP ARTHROPLASTY Left 08/12/2020   Procedure: TOTAL HIP ARTHROPLASTY ANTERIOR APPROACH;  Surgeon: Kennedy Bucker, MD;  Location: [facility];  Service: Orthopedics;  Laterality: Left;   TUBAL LIGATION  1985   Patient Active Problem List   Diagnosis Date Noted   Infectious sialoadenitis of right submandibular gland 01/25/2021   Submandibular sialolithiasis 01/25/2021   Status post total hip replacement, left 08/12/2020   S/P hip replacement 04/08/2020   Restless legs 07/02/2018   Postmenopausal bleeding 07/02/2018   Lesion of vulva 07/02/2018   Nephrolithiasis 07/02/2018   Cataract 07/02/2018   Medicare annual  wellness visit, initial 11/09/2017   Headache disorder 08/26/2017   Obesity (BMI 35.0-39.9 without comorbidity) 07/09/2017   Lichen sclerosus 07/09/2017   History of pulmonary embolus (PE) 07/09/2017   Chronic daily headache 07/09/2017   DVT of leg (deep venous thrombosis) (HCC) 08/17/2013   Hypoxia 08/16/2013   Pleuritic chest pain 08/15/2013   Acute pulmonary embolism (HCC) 08/15/2013   Arthropathy of pelvic region and thigh 07/03/2012   Arthritis, hip 07/03/2012    PCP: Marisue Ivan, MD   REFERRING PROVIDER: Visgauss, Patsi Sears, MD  REFERRING DIAG: [phone] (ICD-10-CM) - Other  specified joint disorders, right shoulder  THERAPY DIAG:  Acute pain of right shoulder  Stiffness of right shoulder, not elsewhere classified  Chronic right shoulder pain  Rationale for Evaluation and Treatment: Rehabilitation  ONSET DATE: S/P excision of R shoulder mass on 10/06/2022  SUBJECTIVE:                                                                                                                                                                                                         SUBJECTIVE STATEMENT:  Pt reports that she is able to 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  Pt notes no increased pain with the movement as well.     PERTINENT HISTORY:  S/P excision of R shoulder mass on 10/06/2022. R shoulder feels bet ter in general because she is currently not using her arm. 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 Per MD note: Gentle ROM, no active trap or deltoid engagement until 6 weeks post op. Has not yet had PT and feels like she is behind schedule. Wants to be able to lift 20 lbs (granddaughter) with both hands as well as to be able to drive.    BP is controlled per pt.  No latex bands  PAIN:  Are you having pain? No pain unless moving her arm the wrong way   PRECAUTIONS: Gentle ROM, no active trap or deltoid engagement until 6 weeks post op  RED FLAGS: No known red flags    WEIGHT BEARING RESTRICTIONS:   FALLS:  Has patient fallen in last 6 months? No  LIVING ENVIRONMENT: Lives with:  Lives in:  Stairs:  Has following equipment at home:   OCCUPATION:   PLOF: Independent  PATIENT GOALS: Wants to be able to lift 20 lbs (granddaughter) with both hands as well as to be able to drive.   NEXT MD VISIT:   OBJECTIVE:   TODAY'S TREATMENT: DATE:  12/06/22   Manual:  Supine long arc distraction of R shoulder, 30 sec bouts x4 Supine AP glides of R shoulder, 30 sec bouts, for increased ROM of the R  shoulder Supine inferior glides of the R shoulder, 30 sec bouts for increased ROM of the R shoulder STM to R biceps/deltoid region for increased tissue extensibility STM to R UT for pain modulation and utilization of TP release technique   TherEx:  Supine rhythmic stabilization, 30 sec bouts Standing low rows with TRX band, x10 Standing scapular retractions with TRX band, x10      PATIENT EDUCATION:  Education details: there-ex, HEP, POC Person educated: Patient Education method: Explanation, Demonstration, Tactile cues, Verbal cues, and Handouts Education comprehension: verbalized understanding and returned demonstration   HOME EXERCISE PROGRAM: Access Code: [email or web address] URL: [email or web address] Date: 11/06/2022 Prepared by: Loralyn Freshwater & Tomasa Hose  Exercises  - Standing Isometric Shoulder External Rotation with Doorway  - 1 x daily - 7 x weekly - 3 sets - 10 reps - 5 seconds hold - Standing Isometric Shoulder Internal Rotation at Doorway  - 1 x daily - 7 x weekly - 3 sets - 10 reps - 5 seconds hold - Standing Isometric Shoulder Flexion with Doorway - Arm Bent  - 1 x daily - 7 x weekly - 3 sets - 10 reps - 5 seconds hold - Standing Isometric Shoulder Abduction with Doorway  - 1 x daily - 7 x weekly - 3 sets - 10 reps - 5 seconds hold - Standing Isometric Shoulder Extension with Doorway - Arm Bent  - 1 x daily - 7 x weekly - 3 sets - 10 reps - 5 seconds hold - Standing Bilateral Shoulder Internal Rotation AAROM with Dowel  - 1 x daily - 7 x weekly - 3 sets - 10 reps - Standing Shoulder Extension ROM with Dowel  - 1 x daily - 7 x weekly - 3 sets - 10 reps - Wall Push Up  - 1 x daily - 7 x weekly - 3 sets - 10 reps    ASSESSMENT:  CLINICAL IMPRESSION:  Pt responded well to the manual therapy and noted a reduction in overall pain.  Pt also introduced to TRX band exercises to improve exercise tolerance and strength of the shoulder.  Pt performed well, however  did not enjoy the exercises due to fear of falling.  Pt otherwise performed well and will continue to improve overall functional use of the R shoulder.  Pt will continue to benefit from skilled therapy to address remaining deficits in order to improve overall QoL and return to PLOF.         OBJECTIVE IMPAIRMENTS: decreased ROM, decreased strength, impaired flexibility, improper body mechanics, postural dysfunction, and pain.   ACTIVITY LIMITATIONS: carrying, lifting, bathing, toileting, dressing, reach over head, and hygiene/grooming  PARTICIPATION LIMITATIONS:   PERSONAL FACTORS: Age, Fitness, Past/current experiences, Time since onset of injury/illness/exacerbation, and 1-2 comorbidities: arthritis, pre-diabetes  are also affecting patient's functional outcome.   REHAB POTENTIAL: Fair    CLINICAL DECISION MAKING: Stable/uncomplicated  EVALUATION COMPLEXITY: Low   GOALS: Goals reviewed with patient? Yes  SHORT TERM GOALS: Target date: 11/24/2022  Pt will be independent with her initial HEP to improve ROM, strength, function, and ability to raise her arm and lift without pain or difficulty.  Baseline: Pt has started her initial HEP (11/06/2022) Goal status: INITIAL   LONG TERM GOALS: Target date: 01/04/2023  Pt will improve her R shoulder flexion and  abduction AROM to 140 degrees or more and ER AROM to 65 degrees or more to promote ability to raise her arm with less difficulty up when appropriate.  Baseline:  Passive ROM Right eval  Shoulder flexion 100  Shoulder abduction 90  Shoulder internal rotation 90  Shoulder external rotation 55   (11/06/2022)  Goal status: INITIAL  2.  Pt will have at least 4/5 R shoulder flexion, abduction, ER, and IR to promote ability to use her R UE for functional tasks with less difficulty.  Baseline: R shoulder strength not yet tested secondary to precautions and to allow for more healing (11/06/2022) Goal status: INITIAL  3.  Pt will  improve her R shoulder FOTO score by at least 10 points as a demonstration of improved function.  Baseline: R shoulder FOTO 4 (11/06/2022) Goal status: INITIAL     PLAN:  PT FREQUENCY: 1-2x/week  PT DURATION: 8 weeks  PLANNED INTERVENTIONS: 97110-Therapeutic exercises, 97530- Therapeutic activity, [id number]- Neuromuscular re-education, 97140- Manual therapy, 97014- Electrical stimulation (unattended), 97033- Ionotophoresis 4mg /ml Dexamethasone, Patient/Family education, and Dry Needling  PLAN FOR NEXT SESSION:  perform manual therapy, add to HEP.  posture, PROM, manual techniques, modalities PRN   Nolon Bussing, PT, DPT Physical Therapist - [facility]  [facility]  12/06/22, 11:11 AM

## 2022-12-11 ENCOUNTER — Ambulatory Visit: Payer: Medicare PPO

## 2022-12-11 DIAGNOSIS — G8929 Other chronic pain: Secondary | ICD-10-CM

## 2022-12-11 DIAGNOSIS — M25511 Pain in right shoulder: Secondary | ICD-10-CM | POA: Diagnosis not present

## 2022-12-11 DIAGNOSIS — M25611 Stiffness of right shoulder, not elsewhere classified: Secondary | ICD-10-CM

## 2022-12-11 NOTE — Therapy (Signed)
OUTPATIENT PHYSICAL THERAPY TREATMENT    Patient Name: Savannah Wilkins MRN: [id number] DOB:1950/03/30, 72 y.o., female Today's Date: 12/11/2022  END OF SESSION:  PT End of Session - 12/11/22 0904     Visit Number 9    Number of Visits 17    Date for PT Re-Evaluation 01/04/23    Authorization Time Period 11/06/22-01/12/23    PT Start Time 0902    PT Stop Time 0945    PT Time Calculation (min) 43 min    Activity Tolerance Patient tolerated treatment well    Behavior During Therapy [facility] for tasks assessed/performed                 Past Medical History:  Diagnosis Date   Arthritis    DVT (deep venous thrombosis) (HCC) 2015   pulmonary emboli   GERD (gastroesophageal reflux disease)    occ   Headache    history of ileus 09/2020   History of kidney stones    Lichen sclerosus    Pneumonia 2015   PONV (postoperative nausea and vomiting)    Pre-diabetes    Pulmonary emboli (HCC) 2015   Restless leg syndrome    Past Surgical History:  Procedure Laterality Date   CATARACT EXTRACTION Bilateral 2013   CHOLECYSTECTOMY  1996   COLONOSCOPY  2015   CYST EXCISION Right 2010   pinkie finger   dcr Right    right eye   EXTRACORPOREAL SHOCK WAVE LITHOTRIPSY Bilateral 2006   twice on left and once on right   EXTRACORPOREAL SHOCK WAVE LITHOTRIPSY Right 07/25/2018   Procedure: EXTRACORPOREAL SHOCK WAVE LITHOTRIPSY (ESWL);  Surgeon: Sondra Come, MD;  Location: [facility] ORS;  Service: Urology;  Laterality: Right;   EYE SURGERY Bilateral 2013   cataract extractions   HYSTEROSCOPY  2015   IVC FILTER INSERTION N/A 04/06/2020   Procedure: IVC FILTER INSERTION;  Surgeon: Renford Dills, MD;  Location: [facility] INVASIVE CV LAB;  Service: Cardiovascular;  Laterality: N/A;   IVC FILTER REMOVAL N/A 10/26/2020   Procedure: IVC FILTER REMOVAL;  Surgeon: Renford Dills, MD;  Location: [facility] INVASIVE CV LAB;  Service: Cardiovascular;  Laterality: N/A;   IVC FILTER REMOVAL N/A 11/24/2020    Procedure: IVC FILTER REMOVAL;  Surgeon: Renford Dills, MD;  Location: [facility] INVASIVE CV LAB;  Service: Cardiovascular;  Laterality: N/A;   kidney stone removal Right 2006, 2013   right ureteroscopic stone removals   RETINAL TEAR REPAIR CRYOTHERAPY Bilateral    RIGHT 2007 AND 2008; LEFT 2012   SUBMANDIBULAR GLAND EXCISION Right 03/15/2021   Procedure: EXCISION SUBMANDIBULAR GLAND;  Surgeon: Bud Face, MD;  Location: [facility] ORS;  Service: ENT;  Laterality: Right;   TONSILLECTOMY  1984   TOTAL HIP ARTHROPLASTY Right 04/08/2020   Procedure: TOTAL HIP ARTHROPLASTY ANTERIOR APPROACH;  Surgeon: Kennedy Bucker, MD;  Location: [facility] ORS;  Service: Orthopedics;  Laterality: Right;   TOTAL HIP ARTHROPLASTY Left 08/12/2020   Procedure: TOTAL HIP ARTHROPLASTY ANTERIOR APPROACH;  Surgeon: Kennedy Bucker, MD;  Location: [facility] ORS;  Service: Orthopedics;  Laterality: Left;   TUBAL LIGATION  1985   Patient Active Problem List   Diagnosis Date Noted   Infectious sialoadenitis of right submandibular gland 01/25/2021   Submandibular sialolithiasis 01/25/2021   Status post total hip replacement, left 08/12/2020   S/P hip replacement 04/08/2020   Restless legs 07/02/2018   Postmenopausal bleeding 07/02/2018   Lesion of vulva 07/02/2018   Nephrolithiasis 07/02/2018   Cataract 07/02/2018   Medicare annual  wellness visit, initial 11/09/2017   Headache disorder 08/26/2017   Obesity (BMI 35.0-39.9 without comorbidity) 07/09/2017   Lichen sclerosus 07/09/2017   History of pulmonary embolus (PE) 07/09/2017   Chronic daily headache 07/09/2017   DVT of leg (deep venous thrombosis) (HCC) 08/17/2013   Hypoxia 08/16/2013   Pleuritic chest pain 08/15/2013   Acute pulmonary embolism (HCC) 08/15/2013   Arthropathy of pelvic region and thigh 07/03/2012   Arthritis, hip 07/03/2012    PCP: Marisue Ivan, MD   REFERRING PROVIDER: Visgauss, Patsi Sears, MD  REFERRING DIAG: [phone] (ICD-10-CM) - Other  specified joint disorders, right shoulder  THERAPY DIAG:  Acute pain of right shoulder  Stiffness of right shoulder, not elsewhere classified  Chronic right shoulder pain  Rationale for Evaluation and Treatment: Rehabilitation  ONSET DATE: S/P excision of R shoulder mass on 10/06/2022  SUBJECTIVE:                                                                                                                                                                                                         SUBJECTIVE STATEMENT:  Pt reports that she is still unable to perform IR of the R shoulder  which prevents her from being able to fasten her bra.  Pt is very concerned about that and the inability to push forward with the shoulder a this time.     PERTINENT HISTORY:  S/P excision of R shoulder mass on 10/06/2022. R shoulder feels bet ter in general because she is currently not using her arm. 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 Per MD note: Gentle ROM, no active trap or deltoid engagement until 6 weeks post op. Has not yet had PT and feels like she is behind schedule. Wants to be able to lift 20 lbs (granddaughter) with both hands as well as to be able to drive.    BP is controlled per pt.  No latex bands  PAIN:  Are you having pain? No pain unless moving her arm the wrong way   PRECAUTIONS: Gentle ROM, no active trap or deltoid engagement until 6 weeks post op  RED FLAGS: No known red flags    WEIGHT BEARING RESTRICTIONS:   FALLS:  Has patient fallen in last 6 months? No  LIVING ENVIRONMENT: Lives with:  Lives in:  Stairs:  Has following equipment at home:   OCCUPATION:   PLOF: Independent  PATIENT GOALS: Wants to be able to lift 20 lbs (granddaughter) with both hands as well as to be able to drive.   NEXT MD  VISIT:   OBJECTIVE:   TODAY'S TREATMENT: DATE: 12/11/22  MHP applied to R shoulder for pain modulation  Manual:  Supine long arc distraction of R  shoulder, 30 sec bouts x4 Supine AP glides of R shoulder, 30 sec bouts, for increased ROM of the R shoulder Supine PA glides of the R shoulder, 30 sec bouts, for increased anterior translation and improved IR of the R shoulder Supine inferior glides of the R shoulder, 30 sec bouts for increased ROM of the R shoulder STM to R biceps/deltoid region for increased tissue extensibility STM to R UT for pain modulation and utilization of TP release technique   TherEx:  Supine rhythmic stabilization, 30 sec bouts x3 Standing low rows with TRX band, x10 Standing scapular retractions with TRX band, x10 Sidelying sleeper stretch, 3x30 secs Standing shoulder extension with PVC pipe, 2x10 Standing IR with PVC pipe, 2x10     PATIENT EDUCATION:  Education details: there-ex, HEP, POC Person educated: Patient Education method: Explanation, Demonstration, Tactile cues, Verbal cues, and Handouts Education comprehension: verbalized understanding and returned demonstration   HOME EXERCISE PROGRAM: Access Code: [email or web address] URL: [email or web address] Date: 11/06/2022 Prepared by: Loralyn Freshwater & Tomasa Hose  Exercises  - Standing Isometric Shoulder External Rotation with Doorway  - 1 x daily - 7 x weekly - 3 sets - 10 reps - 5 seconds hold - Standing Isometric Shoulder Internal Rotation at Doorway  - 1 x daily - 7 x weekly - 3 sets - 10 reps - 5 seconds hold - Standing Isometric Shoulder Flexion with Doorway - Arm Bent  - 1 x daily - 7 x weekly - 3 sets - 10 reps - 5 seconds hold - Standing Isometric Shoulder Abduction with Doorway  - 1 x daily - 7 x weekly - 3 sets - 10 reps - 5 seconds hold - Standing Isometric Shoulder Extension with Doorway - Arm Bent  - 1 x daily - 7 x weekly - 3 sets - 10 reps - 5 seconds hold - Standing Bilateral Shoulder Internal Rotation AAROM with Dowel  - 1 x daily - 7 x weekly - 3 sets - 10 reps - Standing Shoulder Extension ROM with Dowel  - 1 x daily - 7 x  weekly - 3 sets - 10 reps - Wall Push Up  - 1 x daily - 7 x weekly - 3 sets - 10 reps    ASSESSMENT:  CLINICAL IMPRESSION:  Pt continues to have difficulty with IR of the R shoulder and was assessed and targeted with manual therapy.  Pt does have some anterior capsule tightness and pt noted with PA glides, the pressure and the tightness were more noticeable.  Pt also given sleeper stretch as part of updated HEP in order to improve this movement.  Pt will continue to improve strength and ROM in order to improve overall QoL.   Pt will continue to benefit from skilled therapy to address remaining deficits in order to improve overall QoL and return to PLOF.          OBJECTIVE IMPAIRMENTS: decreased ROM, decreased strength, impaired flexibility, improper body mechanics, postural dysfunction, and pain.   ACTIVITY LIMITATIONS: carrying, lifting, bathing, toileting, dressing, reach over head, and hygiene/grooming  PARTICIPATION LIMITATIONS:   PERSONAL FACTORS: Age, Fitness, Past/current experiences, Time since onset of injury/illness/exacerbation, and 1-2 comorbidities: arthritis, pre-diabetes  are also affecting patient's functional outcome.   REHAB POTENTIAL: Fair    CLINICAL DECISION MAKING: Stable/uncomplicated  EVALUATION COMPLEXITY: Low  GOALS: Goals reviewed with patient? Yes  SHORT TERM GOALS: Target date: 11/24/2022  Pt will be independent with her initial HEP to improve ROM, strength, function, and ability to raise her arm and lift without pain or difficulty.  Baseline: Pt has started her initial HEP (11/06/2022) Goal status: INITIAL   LONG TERM GOALS: Target date: 01/04/2023  Pt will improve her R shoulder flexion and abduction AROM to 140 degrees or more and ER AROM to 65 degrees or more to promote ability to raise her arm with less difficulty up when appropriate.  Baseline:  Passive ROM Right eval  Shoulder flexion 100  Shoulder abduction 90  Shoulder internal  rotation 90  Shoulder external rotation 55   (11/06/2022)  Goal status: INITIAL  2.  Pt will have at least 4/5 R shoulder flexion, abduction, ER, and IR to promote ability to use her R UE for functional tasks with less difficulty.  Baseline: R shoulder strength not yet tested secondary to precautions and to allow for more healing (11/06/2022) Goal status: INITIAL  3.  Pt will improve her R shoulder FOTO score by at least 10 points as a demonstration of improved function.  Baseline: R shoulder FOTO 4 (11/06/2022) Goal status: INITIAL     PLAN:  PT FREQUENCY: 1-2x/week  PT DURATION: 8 weeks  PLANNED INTERVENTIONS: 97110-Therapeutic exercises, 97530- Therapeutic activity, [id number]- Neuromuscular re-education, 97140- Manual therapy, 97014- Electrical stimulation (unattended), 97033- Ionotophoresis 4mg /ml Dexamethasone, Patient/Family education, and Dry Needling  PLAN FOR NEXT SESSION:  perform manual therapy, add to HEP.  posture, PROM, manual techniques, modalities PRN   Nolon Bussing, PT, DPT Physical Therapist - [facility]  [facility]  12/11/22, 10:26 AM

## 2022-12-13 ENCOUNTER — Ambulatory Visit: Payer: Medicare PPO

## 2022-12-13 DIAGNOSIS — M25611 Stiffness of right shoulder, not elsewhere classified: Secondary | ICD-10-CM

## 2022-12-13 DIAGNOSIS — G8929 Other chronic pain: Secondary | ICD-10-CM

## 2022-12-13 DIAGNOSIS — M25511 Pain in right shoulder: Secondary | ICD-10-CM

## 2022-12-13 NOTE — Therapy (Signed)
OUTPATIENT PHYSICAL THERAPY TREATMENT/PHYSICAL THERAPY PROGRESS NOTE   Dates of reporting period  11/06/22   to   12/13/22    Patient Name: Savannah Wilkins MRN: [id number] DOB:[birth year], 72 y.o., female Today's Date: 12/13/2022  END OF SESSION:  PT End of Session - 12/13/22 0938     Visit Number 10    Number of Visits 17    Date for PT Re-Evaluation 01/04/23    Authorization Time Period 11/06/22-01/12/23    PT Start Time 0938    PT Stop Time 1020    PT Time Calculation (min) 42 min    Activity Tolerance Patient tolerated treatment well    Behavior During Therapy [facility] for tasks assessed/performed                 Past Medical History:  Diagnosis Date   Arthritis    DVT (deep venous thrombosis) (HCC) 2015   pulmonary emboli   GERD (gastroesophageal reflux disease)    occ   Headache    history of ileus 09/2020   History of kidney stones    Lichen sclerosus    Pneumonia 2015   PONV (postoperative nausea and vomiting)    Pre-diabetes    Pulmonary emboli (HCC) 2015   Restless leg syndrome    Past Surgical History:  Procedure Laterality Date   CATARACT EXTRACTION Bilateral 2013   CHOLECYSTECTOMY  1996   COLONOSCOPY  2015   CYST EXCISION Right 2010   pinkie finger   dcr Right    right eye   EXTRACORPOREAL SHOCK WAVE LITHOTRIPSY Bilateral 2006   twice on left and once on right   EXTRACORPOREAL SHOCK WAVE LITHOTRIPSY Right 07/25/2018   Procedure: EXTRACORPOREAL SHOCK WAVE LITHOTRIPSY (ESWL);  Surgeon: Sondra Come, MD;  Location: [facility] ORS;  Service: Urology;  Laterality: Right;   EYE SURGERY Bilateral 2013   cataract extractions   HYSTEROSCOPY  2015   IVC FILTER INSERTION N/A 04/06/2020   Procedure: IVC FILTER INSERTION;  Surgeon: Renford Dills, MD;  Location: [facility] INVASIVE CV LAB;  Service: Cardiovascular;  Laterality: N/A;   IVC FILTER REMOVAL N/A 10/26/2020   Procedure: IVC FILTER REMOVAL;  Surgeon: Renford Dills, MD;  Location: [facility] INVASIVE  CV LAB;  Service: Cardiovascular;  Laterality: N/A;   IVC FILTER REMOVAL N/A 11/24/2020   Procedure: IVC FILTER REMOVAL;  Surgeon: Renford Dills, MD;  Location: [facility] INVASIVE CV LAB;  Service: Cardiovascular;  Laterality: N/A;   kidney stone removal Right 2006, 2013   right ureteroscopic stone removals   RETINAL TEAR REPAIR CRYOTHERAPY Bilateral    RIGHT 2007 AND 2008; LEFT 2012   SUBMANDIBULAR GLAND EXCISION Right 03/15/2021   Procedure: EXCISION SUBMANDIBULAR GLAND;  Surgeon: Bud Face, MD;  Location: [facility] ORS;  Service: ENT;  Laterality: Right;   TONSILLECTOMY  1984   TOTAL HIP ARTHROPLASTY Right 04/08/2020   Procedure: TOTAL HIP ARTHROPLASTY ANTERIOR APPROACH;  Surgeon: Kennedy Bucker, MD;  Location: [facility] ORS;  Service: Orthopedics;  Laterality: Right;   TOTAL HIP ARTHROPLASTY Left 08/12/2020   Procedure: TOTAL HIP ARTHROPLASTY ANTERIOR APPROACH;  Surgeon: Kennedy Bucker, MD;  Location: [facility] OR[facility];  Service: Orthopedics;  Laterality: Left;   TUBAL LIGATION  1985   Patient Active Problem List   Diagnosis Date Noted   Infectious sialoadenitis of right submandibular gland 01/25/2021   Submandibular sialolithiasis 01/25/2021   Status post total hip replacement, left 08/12/2020   S/P hip replacement 04/08/2020   Restless legs 07/02/2018   Postmenopausal bleeding 07/02/2018  Lesion of vulva 07/02/2018   Nephrolithiasis 07/02/2018   Cataract 07/02/2018   Medicare annual wellness visit, initial 11/09/2017   Headache disorder 08/26/2017   Obesity (BMI 35.0-39.9 without comorbidity) 07/09/2017   Lichen sclerosus 07/09/2017   History of pulmonary embolus (PE) 07/09/2017   Chronic daily headache 07/09/2017   DVT of leg (deep venous thrombosis) (HCC) 08/17/2013   Hypoxia 08/16/2013   Pleuritic chest pain 08/15/2013   Acute pulmonary embolism (HCC) 08/15/2013   Arthropathy of pelvic region and thigh 07/03/2012   Arthritis, hip 07/03/2012    PCP: Marisue Ivan,  MD   REFERRING PROVIDER: Visgauss, Patsi Sears, MD  REFERRING DIAG: [phone] (ICD-10-CM) - Other specified joint disorders, right shoulder  THERAPY DIAG:  Acute pain of right shoulder  Stiffness of right shoulder, not elsewhere classified  Chronic right shoulder pain  Rationale for Evaluation and Treatment: Rehabilitation  ONSET DATE: S/P excision of R shoulder mass on 10/06/2022  SUBJECTIVE:                                                                                                                                                                                                         SUBJECTIVE STATEMENT:  Pt reports she was performing the sleeper stretch and reports that it just doesn't feel right.  Pt notes that there is some pain with performing the stretch.   PERTINENT HISTORY:  S/P excision of R shoulder mass on 10/06/2022. R shoulder feels bet ter in general because she is currently not using her arm. 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 Per MD note: Gentle ROM, no active trap or deltoid engagement until 6 weeks post op. Has not yet had PT and feels like she is behind schedule. Wants to be able to lift 20 lbs (granddaughter) with both hands as well as to be able to drive.    BP is controlled per pt.  No latex bands  PAIN:  Are you having pain? No pain unless moving her arm the wrong way   PRECAUTIONS: Gentle ROM, no active trap or deltoid engagement until 6 weeks post op  RED FLAGS: No known red flags    WEIGHT BEARING RESTRICTIONS:   FALLS:  Has patient fallen in last 6 months? No  LIVING ENVIRONMENT: Lives with:  Lives in:  Stairs:  Has following equipment at home:   OCCUPATION:   PLOF: Independent  PATIENT GOALS: Wants to be able to lift 20 lbs (granddaughter) with both hands as well as to be able to drive.   NEXT MD VISIT:  OBJECTIVE:   TODAY'S TREATMENT: DATE: 12/13/22  MHP applied to R shoulder for pain  modulation  Manual:  Supine long arc distraction of R shoulder, 30 sec bouts x4 Supine AP glides of R shoulder, 30 sec bouts, for increased ROM of the R shoulder Prone PA glides of the R shoulder, 30 sec bouts, for increased anterior translation and improved IR of the R shoulder Supine inferior glides of the R shoulder, 30 sec bouts for increased ROM of the R shoulder STM to R biceps/deltoid region for increased tissue extensibility STM to R UT for pain modulation and utilization of TP release technique   TherEx:  Standing R shoulder IR behind back with towel, 10 sec stretch x3 Standing R shoulder abduction wall climbs, x10 Seated R shoulder adduction crawl onto L shoulder, x10   TherAct:   Goal assessment performed and noted below for progress report.   Pt educated on proper performance of the sleeper stretch and noted that it should feel slightly uncomfortable, but not painful.   PATIENT EDUCATION:  Education details: there-ex, HEP, POC Person educated: Patient Education method: Explanation, Demonstration, Tactile cues, Verbal cues, and Handouts Education comprehension: verbalized understanding and returned demonstration   HOME EXERCISE PROGRAM: Access Code: [email or web address] URL: [email or web address] Date: 11/06/2022 Prepared by: Loralyn Freshwater & Tomasa Hose  Exercises  - Standing Isometric Shoulder External Rotation with Doorway  - 1 x daily - 7 x weekly - 3 sets - 10 reps - 5 seconds hold - Standing Isometric Shoulder Internal Rotation at Doorway  - 1 x daily - 7 x weekly - 3 sets - 10 reps - 5 seconds hold - Standing Isometric Shoulder Flexion with Doorway - Arm Bent  - 1 x daily - 7 x weekly - 3 sets - 10 reps - 5 seconds hold - Standing Isometric Shoulder Abduction with Doorway  - 1 x daily - 7 x weekly - 3 sets - 10 reps - 5 seconds hold - Standing Isometric Shoulder Extension with Doorway - Arm Bent  - 1 x daily - 7 x weekly - 3 sets - 10 reps - 5 seconds  hold - Standing Bilateral Shoulder Internal Rotation AAROM with Dowel  - 1 x daily - 7 x weekly - 3 sets - 10 reps - Standing Shoulder Extension ROM with Dowel  - 1 x daily - 7 x weekly - 3 sets - 10 reps - Wall Push Up  - 1 x daily - 7 x weekly - 3 sets - 10 reps    ASSESSMENT:  CLINICAL IMPRESSION:  Pt has responded well to the treatment approaches thus far in current POC.  Pt still has some clicking/catching in the shoulder that is still concerning, and typically occurs with shoulder abduction.  Pt given updated HEP to include IR and abduction stretches in order to improve overall motion.  Pt still lacking strength with abduction movement and is not able to achieve full ROM at this time.  Patient's condition has the potential to improve in response to therapy. Maximum improvement is yet to be obtained. The anticipated improvement is attainable and reasonable in a generally predictable time.   Pt will continue to benefit from skilled therapy to address remaining deficits in order to improve overall QoL and return to PLOF.          OBJECTIVE IMPAIRMENTS: decreased ROM, decreased strength, impaired flexibility, improper body mechanics, postural dysfunction, and pain.   ACTIVITY LIMITATIONS: carrying, lifting, bathing, toileting, dressing, reach over head, and hygiene/grooming  PARTICIPATION LIMITATIONS:   PERSONAL FACTORS: Age, Fitness, Past/current experiences, Time since onset of injury/illness/exacerbation, and 1-2 comorbidities: arthritis, pre-diabetes  are also affecting patient's functional outcome.   REHAB POTENTIAL: Fair    CLINICAL DECISION MAKING: Stable/uncomplicated  EVALUATION COMPLEXITY: Low   GOALS: Goals reviewed with patient? Yes  SHORT TERM GOALS: Target date: 11/24/2022  Pt will be independent with her initial HEP to improve ROM, strength, function, and ability to raise her arm and lift without pain or difficulty.  Baseline: Pt has started her initial HEP  (11/06/2022) Goal status: INITIAL   LONG TERM GOALS: Target date: 01/04/2023  Pt will improve her R shoulder flexion and abduction AROM to 140 degrees or more and ER AROM to 65 degrees or more to promote ability to raise her arm with less difficulty up when appropriate.  Baseline:  Passive ROM Right eval  Shoulder flexion 100  Shoulder abduction 90  Shoulder internal rotation 90  Shoulder external rotation 55  (11/06/2022)  Active ROM Right eval  Shoulder flexion 110  Shoulder abduction 98  Shoulder internal rotation 90  Shoulder external rotation 55   Goal status: INITIAL   2.  Pt will have at least 4/5 R shoulder flexion, abduction, ER, and IR to promote ability to use her R UE for functional tasks with less difficulty.  Baseline: R shoulder strength not yet tested secondary to precautions and to allow for more healing (11/06/2022)  12/13/22 MMT Right  Shoulder flexion 4+  Shoulder abduction 3+*  Shoulder internal rotation 4+  Shoulder external rotation 4+  * - denotes pain Goal status: INITIAL   3.  Pt will improve her R shoulder FOTO score by at least 10 points as a demonstration of improved function.  Baseline: R shoulder FOTO 4 (11/06/2022) 12/13/22: 55 Goal status: MET     PLAN:  PT FREQUENCY: 1-2x/week  PT DURATION: 8 weeks  PLANNED INTERVENTIONS: 97110-Therapeutic exercises, 97530- Therapeutic activity, 97112- Neuromuscular re-education, 97140- Manual therapy, 97014- Electrical stimulation (unattended), 97033- Ionotophoresis 4mg /ml Dexamethasone, Patient/Family education, and Dry Needling  PLAN FOR NEXT SESSION:   perform manual therapy, add to HEP.  posture, PROM, manual techniques, modalities PRN   Nolon Bussing, PT, DPT Physical Therapist - [facility]  12/13/22, 9:38 AM

## 2022-12-19 ENCOUNTER — Ambulatory Visit: Payer: Medicare PPO | Attending: Oncology

## 2022-12-19 DIAGNOSIS — M25611 Stiffness of right shoulder, not elsewhere classified: Secondary | ICD-10-CM | POA: Insufficient documentation

## 2022-12-19 DIAGNOSIS — M25511 Pain in right shoulder: Secondary | ICD-10-CM | POA: Insufficient documentation

## 2022-12-19 DIAGNOSIS — G8929 Other chronic pain: Secondary | ICD-10-CM | POA: Diagnosis present

## 2022-12-19 NOTE — Therapy (Signed)
OUTPATIENT PHYSICAL THERAPY TREATMENT    Patient Name: Savannah Wilkins MRN: [id number] DOB:13-May-1950, 72 y.o., female Today's Date: 12/19/2022  END OF SESSION:  PT End of Session - 12/19/22 0957     Visit Number 11    Number of Visits 17    Date for PT Re-Evaluation 01/04/23    Authorization Time Period 11/06/22-01/12/23    PT Start Time 0954    PT Stop Time 1036    PT Time Calculation (min) 42 min    Activity Tolerance Patient tolerated treatment well    Behavior During Therapy [facility] for tasks assessed/performed                 Past Medical History:  Diagnosis Date   Arthritis    DVT (deep venous thrombosis) (HCC) 2015   pulmonary emboli   GERD (gastroesophageal reflux disease)    occ   Headache    history of ileus 09/2020   History of kidney stones    Lichen sclerosus    Pneumonia 2015   PONV (postoperative nausea and vomiting)    Pre-diabetes    Pulmonary emboli (HCC) 2015   Restless leg syndrome    Past Surgical History:  Procedure Laterality Date   CATARACT EXTRACTION Bilateral 2013   CHOLECYSTECTOMY  1996   COLONOSCOPY  2015   CYST EXCISION Right 2010   pinkie finger   dcr Right    right eye   EXTRACORPOREAL SHOCK WAVE LITHOTRIPSY Bilateral 2006   twice on left and once on right   EXTRACORPOREAL SHOCK WAVE LITHOTRIPSY Right 07/25/2018   Procedure: EXTRACORPOREAL SHOCK WAVE LITHOTRIPSY (ESWL);  Surgeon: Sondra Come, MD;  Location: [facility];  Service: Urology;  Laterality: Right;   EYE SURGERY Bilateral 2013   cataract extractions   HYSTEROSCOPY  2015   IVC FILTER INSERTION N/A 04/06/2020   Procedure: IVC FILTER INSERTION;  Surgeon: Renford Dills, MD;  Location: [facility];  Service: Cardiovascular;  Laterality: N/A;   IVC FILTER REMOVAL N/A 10/26/2020   Procedure: IVC FILTER REMOVAL;  Surgeon: Renford Dills, MD;  Location: [facility];  Service: Cardiovascular;  Laterality: N/A;   IVC FILTER REMOVAL N/A 11/24/2020    Procedure: IVC FILTER REMOVAL;  Surgeon: Renford Dills, MD;  Location: [facility];  Service: Cardiovascular;  Laterality: N/A;   kidney stone removal Right 2006, 2013   right ureteroscopic stone removals   RETINAL TEAR REPAIR CRYOTHERAPY Bilateral    RIGHT 2007 AND 2008; LEFT 2012   SUBMANDIBULAR GLAND EXCISION Right 03/15/2021   Procedure: EXCISION SUBMANDIBULAR GLAND;  Surgeon: Bud Face, MD;  Location: [facility];  Service: ENT;  Laterality: Right;   TONSILLECTOMY  1984   TOTAL HIP ARTHROPLASTY Right 04/08/2020   Procedure: TOTAL HIP ARTHROPLASTY ANTERIOR APPROACH;  Surgeon: Kennedy Bucker, MD;  Location: [facility];  Service: Orthopedics;  Laterality: Right;   TOTAL HIP ARTHROPLASTY Left 08/12/2020   Procedure: TOTAL HIP ARTHROPLASTY ANTERIOR APPROACH;  Surgeon: Kennedy Bucker, MD;  Location: [facility];  Service: Orthopedics;  Laterality: Left;   TUBAL LIGATION  1985   Patient Active Problem List   Diagnosis Date Noted   Infectious sialoadenitis of right submandibular gland 01/25/2021   Submandibular sialolithiasis 01/25/2021   Status post total hip replacement, left 08/12/2020   S/P hip replacement 04/08/2020   Restless legs 07/02/2018   Postmenopausal bleeding 07/02/2018   Lesion of vulva 07/02/2018   Nephrolithiasis 07/02/2018   Cataract 07/02/2018   Medicare annual  wellness visit, initial 11/09/2017   Headache disorder 08/26/2017   Obesity (BMI 35.0-39.9 without comorbidity) 07/09/2017   Lichen sclerosus 07/09/2017   History of pulmonary embolus (PE) 07/09/2017   Chronic daily headache 07/09/2017   DVT of leg (deep venous thrombosis) (HCC) 08/17/2013   Hypoxia 08/16/2013   Pleuritic chest pain 08/15/2013   Acute pulmonary embolism (HCC) 08/15/2013   Arthropathy of pelvic region and thigh 07/03/2012   Arthritis, hip 07/03/2012    PCP: Marisue Ivan, MD   REFERRING PROVIDER: Visgauss, Patsi Sears, MD  REFERRING DIAG: [phone] (ICD-10-CM) - Other  specified joint disorders, right shoulder  THERAPY DIAG:  No diagnosis found.  Rationale for Evaluation and Treatment: Rehabilitation  ONSET DATE: S/P excision of R shoulder mass on 10/06/2022  SUBJECTIVE:                                                                                                                                                                                                         SUBJECTIVE STATEMENT:  Pt has been working diligently with the exercises given to her at last visit.  Pt notes that she is able to reach across the chest to the contralateral shoulder.  Pt notes that the R shoulder has not made improvement with reaching behind the back, but she has been trying to work on that.   PERTINENT HISTORY:   S/P excision of R shoulder mass on 10/06/2022. R shoulder feels bet ter in general because she is currently not using her arm. 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 Per MD note: Gentle ROM, no active trap or deltoid engagement until 6 weeks post op. Has not yet had PT and feels like she is behind schedule. Wants to be able to lift 20 lbs (granddaughter) with both hands as well as to be able to drive.    BP is controlled per pt.  No latex bands  PAIN:  Are you having pain? No pain unless moving her arm the wrong way   PRECAUTIONS: Gentle ROM, no active trap or deltoid engagement until 6 weeks post op  RED FLAGS: No known red flags    WEIGHT BEARING RESTRICTIONS:   FALLS:  Has patient fallen in last 6 months? No  LIVING ENVIRONMENT: Lives with:  Lives in:  Stairs:  Has following equipment at home:   OCCUPATION:   PLOF: Independent  PATIENT GOALS: Wants to be able to lift 20 lbs (granddaughter) with both hands as well as to be able to drive.   NEXT MD VISIT:   OBJECTIVE:  TODAY'S TREATMENT: DATE: 12/19/22    TherEx:  Seated cross body shoulder stretch, 30 sec x4 Standing resisted IR with shoulder abducted 90 deg, RTB with  handle, 2x15 Standing wall angels, pt unable to bring elbow/hands fully against wall, so performed in ROM pt has, 2x30 Standing shoulder flexion stretch with arms on elevated table, 30 sec bouts x3      PATIENT EDUCATION:  Education details: there-ex, HEP, POC Person educated: Patient Education method: Explanation, Demonstration, Tactile cues, Verbal cues, and Handouts Education comprehension: verbalized understanding and returned demonstration   HOME EXERCISE PROGRAM: Access Code: [email or web address] URL: [email or web address] Date: 11/06/2022 Prepared by: Loralyn Freshwater & Tomasa Hose  Exercises  - Standing Isometric Shoulder External Rotation with Doorway  - 1 x daily - 7 x weekly - 3 sets - 10 reps - 5 seconds hold - Standing Isometric Shoulder Internal Rotation at Doorway  - 1 x daily - 7 x weekly - 3 sets - 10 reps - 5 seconds hold - Standing Isometric Shoulder Flexion with Doorway - Arm Bent  - 1 x daily - 7 x weekly - 3 sets - 10 reps - 5 seconds hold - Standing Isometric Shoulder Abduction with Doorway  - 1 x daily - 7 x weekly - 3 sets - 10 reps - 5 seconds hold - Standing Isometric Shoulder Extension with Doorway - Arm Bent  - 1 x daily - 7 x weekly - 3 sets - 10 reps - 5 seconds hold - Standing Bilateral Shoulder Internal Rotation AAROM with Dowel  - 1 x daily - 7 x weekly - 3 sets - 10 reps - Standing Shoulder Extension ROM with Dowel  - 1 x daily - 7 x weekly - 3 sets - 10 reps - Wall Push Up  - 1 x daily - 7 x weekly - 3 sets - 10 reps    ASSESSMENT:  CLINICAL IMPRESSION:  Pt continues to respond well to the exercises and was introduced to new exercises in order to assist with the lack of IR of the R shoulder she continues to have.  Pt and therapist discussed current POC and will continue until next week in which pt will likely be discharged.  Pt notes she is happy with the progress that she has made thus far and will continue with HEP to improve.           OBJECTIVE IMPAIRMENTS: decreased ROM, decreased strength, impaired flexibility, improper body mechanics, postural dysfunction, and pain.   ACTIVITY LIMITATIONS: carrying, lifting, bathing, toileting, dressing, reach over head, and hygiene/grooming  PARTICIPATION LIMITATIONS:   PERSONAL FACTORS: Age, Fitness, Past/current experiences, Time since onset of injury/illness/exacerbation, and 1-2 comorbidities: arthritis, pre-diabetes  are also affecting patient's functional outcome.   REHAB POTENTIAL: Fair    CLINICAL DECISION MAKING: Stable/uncomplicated  EVALUATION COMPLEXITY: Low   GOALS: Goals reviewed with patient? Yes  SHORT TERM GOALS: Target date: 11/24/2022  Pt will be independent with her initial HEP to improve ROM, strength, function, and ability to raise her arm and lift without pain or difficulty.  Baseline: Pt has started her initial HEP (11/06/2022) Goal status: INITIAL   LONG TERM GOALS: Target date: 01/04/2023  Pt will improve her R shoulder flexion and abduction AROM to 140 degrees or more and ER AROM to 65 degrees or more to promote ability to raise her arm with less difficulty up when appropriate.  Baseline:  Passive ROM Right eval  Shoulder flexion 100  Shoulder abduction 90  Shoulder internal rotation  90  Shoulder external rotation 55  (11/06/2022)  Active ROM Right eval  Shoulder flexion 110  Shoulder abduction 98  Shoulder internal rotation 90  Shoulder external rotation 55   Goal status: INITIAL   2.  Pt will have at least 4/5 R shoulder flexion, abduction, ER, and IR to promote ability to use her R UE for functional tasks with less difficulty.  Baseline: R shoulder strength not yet tested secondary to precautions and to allow for more healing (11/06/2022)  12/13/22 MMT Right  Shoulder flexion 4+  Shoulder abduction 3+*  Shoulder internal rotation 4+  Shoulder external rotation 4+  * - denotes pain Goal status: INITIAL   3.   Pt will improve her R shoulder FOTO score by at least 10 points as a demonstration of improved function.  Baseline: R shoulder FOTO 4 (11/06/2022) 12/13/22: 55 Goal status: MET     PLAN:  PT FREQUENCY: 1-2x/week  PT DURATION: 8 weeks  PLANNED INTERVENTIONS: 97110-Therapeutic exercises, 97530- Therapeutic activity, 97112- Neuromuscular re-education, 97140- Manual therapy, 97014- Electrical stimulation (unattended), 97033- Ionotophoresis 4mg /ml Dexamethasone, Patient/Family education, and Dry Needling  PLAN FOR NEXT SESSION:   perform manual therapy, add to HEP.  posture, PROM, manual techniques, modalities PRN   Nolon Bussing, PT, DPT Physical Therapist - [facility]  [facility]  12/19/22, 9:58 AM

## 2022-12-21 ENCOUNTER — Ambulatory Visit: Payer: Medicare PPO

## 2022-12-21 DIAGNOSIS — M25511 Pain in right shoulder: Secondary | ICD-10-CM

## 2022-12-21 DIAGNOSIS — M25611 Stiffness of right shoulder, not elsewhere classified: Secondary | ICD-10-CM

## 2022-12-21 DIAGNOSIS — G8929 Other chronic pain: Secondary | ICD-10-CM

## 2022-12-21 NOTE — Therapy (Signed)
OUTPATIENT PHYSICAL THERAPY TREATMENT    Patient Name: Savannah Wilkins MRN: [id number] DOB:06/18/1950, 72 y.o., female Today's Date: 12/21/2022  END OF SESSION:  PT End of Session - 12/21/22 0821     Visit Number 12    Number of Visits 17    Date for PT Re-Evaluation 01/04/23    Authorization Time Period 11/06/22-01/12/23    PT Start Time 0819    PT Stop Time 0900    PT Time Calculation (min) 41 min    Activity Tolerance Patient tolerated treatment well    Behavior During Therapy [facility] for tasks assessed/performed                 Past Medical History:  Diagnosis Date   Arthritis    DVT (deep venous thrombosis) (HCC) 2015   pulmonary emboli   GERD (gastroesophageal reflux disease)    occ   Headache    history of ileus 09/2020   History of kidney stones    Lichen sclerosus    Pneumonia 2015   PONV (postoperative nausea and vomiting)    Pre-diabetes    Pulmonary emboli (HCC) 2015   Restless leg syndrome    Past Surgical History:  Procedure Laterality Date   CATARACT EXTRACTION Bilateral 2013   CHOLECYSTECTOMY  1996   COLONOSCOPY  2015   CYST EXCISION Right 2010   pinkie finger   dcr Right    right eye   EXTRACORPOREAL SHOCK WAVE LITHOTRIPSY Bilateral 2006   twice on left and once on right   EXTRACORPOREAL SHOCK WAVE LITHOTRIPSY Right 07/25/2018   Procedure: EXTRACORPOREAL SHOCK WAVE LITHOTRIPSY (ESWL);  Surgeon: Sondra Come, MD;  Location: [facility];  Service: Urology;  Laterality: Right;   EYE SURGERY Bilateral 2013   cataract extractions   HYSTEROSCOPY  2015   IVC FILTER INSERTION N/A 04/06/2020   Procedure: IVC FILTER INSERTION;  Surgeon: Renford Dills, MD;  Location: [facility];  Service: Cardiovascular;  Laterality: N/A;   IVC FILTER REMOVAL N/A 10/26/2020   Procedure: IVC FILTER REMOVAL;  Surgeon: Renford Dills, MD;  Location: [facility];  Service: Cardiovascular;  Laterality: N/A;   IVC FILTER REMOVAL N/A 11/24/2020    Procedure: IVC FILTER REMOVAL;  Surgeon: Renford Dills, MD;  Location: [facility];  Service: Cardiovascular;  Laterality: N/A;   kidney stone removal Right 2006, 2013   right ureteroscopic stone removals   RETINAL TEAR REPAIR CRYOTHERAPY Bilateral    RIGHT 2007 AND 2008; LEFT 2012   SUBMANDIBULAR GLAND EXCISION Right 03/15/2021   Procedure: EXCISION SUBMANDIBULAR GLAND;  Surgeon: Bud Face, MD;  Location: [facility];  Service: ENT;  Laterality: Right;   TONSILLECTOMY  1984   TOTAL HIP ARTHROPLASTY Right 04/08/2020   Procedure: TOTAL HIP ARTHROPLASTY ANTERIOR APPROACH;  Surgeon: Kennedy Bucker, MD;  Location: [facility];  Service: Orthopedics;  Laterality: Right;   TOTAL HIP ARTHROPLASTY Left 08/12/2020   Procedure: TOTAL HIP ARTHROPLASTY ANTERIOR APPROACH;  Surgeon: Kennedy Bucker, MD;  Location: [facility];  Service: Orthopedics;  Laterality: Left;   TUBAL LIGATION  1985   Patient Active Problem List   Diagnosis Date Noted   Infectious sialoadenitis of right submandibular gland 01/25/2021   Submandibular sialolithiasis 01/25/2021   Status post total hip replacement, left 08/12/2020   S/P hip replacement 04/08/2020   Restless legs 07/02/2018   Postmenopausal bleeding 07/02/2018   Lesion of vulva 07/02/2018   Nephrolithiasis 07/02/2018   Cataract 07/02/2018   Medicare annual  wellness visit, initial 11/09/2017   Headache disorder 08/26/2017   Obesity (BMI 35.0-39.9 without comorbidity) 07/09/2017   Lichen sclerosus 07/09/2017   History of pulmonary embolus (PE) 07/09/2017   Chronic daily headache 07/09/2017   DVT of leg (deep venous thrombosis) (HCC) 08/17/2013   Hypoxia 08/16/2013   Pleuritic chest pain 08/15/2013   Acute pulmonary embolism (HCC) 08/15/2013   Arthropathy of pelvic region and thigh 07/03/2012   Arthritis, hip 07/03/2012    PCP: Marisue Ivan, MD   REFERRING PROVIDER: Visgauss, Patsi Sears, MD  REFERRING DIAG: [id number] (ICD-10-CM) - Other  specified joint disorders, right shoulder  THERAPY DIAG:  Acute pain of right shoulder  Stiffness of right shoulder, not elsewhere classified  Chronic right shoulder pain  Rationale for Evaluation and Treatment: Rehabilitation  ONSET DATE: S/P excision of R shoulder mass on 10/06/2022  SUBJECTIVE:                                                                                                                                                                                                         SUBJECTIVE STATEMENT:  Pt reports she is doing well and is not having any pain at this time.     PERTINENT HISTORY:   S/P excision of R shoulder mass on 10/06/2022. R shoulder feels bet ter in general because she is currently not using her arm. 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 Per MD note: Gentle ROM, no active trap or deltoid engagement until 6 weeks post op. Has not yet had PT and feels like she is behind schedule. Wants to be able to lift 20 lbs (granddaughter) with both hands as well as to be able to drive.    BP is controlled per pt.  No latex bands  PAIN:  Are you having pain? No pain unless moving her arm the wrong way   PRECAUTIONS: Gentle ROM, no active trap or deltoid engagement until 6 weeks post op  RED FLAGS: No known red flags    WEIGHT BEARING RESTRICTIONS:   FALLS:  Has patient fallen in last 6 months? No  LIVING ENVIRONMENT: Lives with:  Lives in:  Stairs:  Has following equipment at home:   OCCUPATION:   PLOF: Independent  PATIENT GOALS: Wants to be able to lift 20 lbs (granddaughter) with both hands as well as to be able to drive.   NEXT MD VISIT:   OBJECTIVE:   TODAY'S TREATMENT: DATE: 12/21/22   TherEx:  Seated scapular retractions on OMEGA, 25#, 2x10 Seated lat pull downs on OMEGA, 35#,  2x10 Seated chest press on OMEGA, 10#, 2x5 Wall pushups with hands on red physioball for increased instability, x10 Ball rolls into forward  flexion with red physioball, x10 Lateral ball rolls in hallway with red physioball above head, x10 each direction   Manual:  Supine long arc distraction of R shoulder, 30 sec bouts x4 STM to R biceps/deltoid region for increased tissue extensibility STM to R UT for pain modulation and utilization of TP release technique PROM with overpressure into all planes of mobility for increased ROM   PATIENT EDUCATION:  Education details: there-ex, HEP, POC Person educated: Patient Education method: Explanation, Demonstration, Tactile cues, Verbal cues, and Handouts Education comprehension: verbalized understanding and returned demonstration   HOME EXERCISE PROGRAM: Access Code: [email or web address] URL: [email or web address] Date: 11/06/2022 Prepared by: Loralyn Freshwater & Tomasa Hose  Exercises  - Standing Isometric Shoulder External Rotation with Doorway  - 1 x daily - 7 x weekly - 3 sets - 10 reps - 5 seconds hold - Standing Isometric Shoulder Internal Rotation at Doorway  - 1 x daily - 7 x weekly - 3 sets - 10 reps - 5 seconds hold - Standing Isometric Shoulder Flexion with Doorway - Arm Bent  - 1 x daily - 7 x weekly - 3 sets - 10 reps - 5 seconds hold - Standing Isometric Shoulder Abduction with Doorway  - 1 x daily - 7 x weekly - 3 sets - 10 reps - 5 seconds hold - Standing Isometric Shoulder Extension with Doorway - Arm Bent  - 1 x daily - 7 x weekly - 3 sets - 10 reps - 5 seconds hold - Standing Bilateral Shoulder Internal Rotation AAROM with Dowel  - 1 x daily - 7 x weekly - 3 sets - 10 reps - Standing Shoulder Extension ROM with Dowel  - 1 x daily - 7 x weekly - 3 sets - 10 reps - Wall Push Up  - 1 x daily - 7 x weekly - 3 sets - 10 reps    ASSESSMENT:  CLINICAL IMPRESSION:  Pt continues to put forth great effort throughout the sessions.  Pt is able to perform increased resistance with exercises and performed well with endurance exercises that involved UE's remaining above  the head.  Pt has continued to make consistent progress with therapy and will continue to benefit from HEP moving forward.  Planned d/c at next visit.     OBJECTIVE IMPAIRMENTS: decreased ROM, decreased strength, impaired flexibility, improper body mechanics, postural dysfunction, and pain.   ACTIVITY LIMITATIONS: carrying, lifting, bathing, toileting, dressing, reach over head, and hygiene/grooming  PARTICIPATION LIMITATIONS:   PERSONAL FACTORS: Age, Fitness, Past/current experiences, Time since onset of injury/illness/exacerbation, and 1-2 comorbidities: arthritis, pre-diabetes  are also affecting patient's functional outcome.   REHAB POTENTIAL: Fair    CLINICAL DECISION MAKING: Stable/uncomplicated  EVALUATION COMPLEXITY: Low   GOALS: Goals reviewed with patient? Yes  SHORT TERM GOALS: Target date: 11/24/2022  Pt will be independent with her initial HEP to improve ROM, strength, function, and ability to raise her arm and lift without pain or difficulty.  Baseline: Pt has started her initial HEP (11/06/2022) Goal status: INITIAL   LONG TERM GOALS: Target date: 01/04/2023  Pt will improve her R shoulder flexion and abduction AROM to 140 degrees or more and ER AROM to 65 degrees or more to promote ability to raise her arm with less difficulty up when appropriate.  Baseline:  Passive ROM Right eval  Shoulder flexion 100  Shoulder  abduction 90  Shoulder internal rotation 90  Shoulder external rotation 55  (11/06/2022)  Active ROM Right eval  Shoulder flexion 110  Shoulder abduction 98  Shoulder internal rotation 90  Shoulder external rotation 55   Goal status: INITIAL   2.  Pt will have at least 4/5 R shoulder flexion, abduction, ER, and IR to promote ability to use her R UE for functional tasks with less difficulty.  Baseline: R shoulder strength not yet tested secondary to precautions and to allow for more healing (11/06/2022)  12/13/22 MMT Right  Shoulder  flexion 4+  Shoulder abduction 3+*  Shoulder internal rotation 4+  Shoulder external rotation 4+  * - denotes pain Goal status: INITIAL   3.  Pt will improve her R shoulder FOTO score by at least 10 points as a demonstration of improved function.  Baseline: R shoulder FOTO 4 (11/06/2022) 12/13/22: 55 Goal status: MET     PLAN:  PT FREQUENCY: 1-2x/week  PT DURATION: 8 weeks  PLANNED INTERVENTIONS: 97110-Therapeutic exercises, 97530- Therapeutic activity, 97112- Neuromuscular re-education, 97140- Manual therapy, 97014- Electrical stimulation (unattended), 97033- Ionotophoresis 4mg /ml Dexamethasone, Patient/Family education, and Dry Needling  PLAN FOR NEXT SESSION:   d/c at next visit   Nolon Bussing, PT, DPT Physical Therapist - [facility]  12/21/22, 9:41 AM

## 2022-12-25 ENCOUNTER — Ambulatory Visit: Payer: Medicare PPO

## 2022-12-25 DIAGNOSIS — M25511 Pain in right shoulder: Secondary | ICD-10-CM

## 2022-12-25 DIAGNOSIS — M25611 Stiffness of right shoulder, not elsewhere classified: Secondary | ICD-10-CM

## 2022-12-25 DIAGNOSIS — G8929 Other chronic pain: Secondary | ICD-10-CM

## 2022-12-25 NOTE — Therapy (Signed)
OUTPATIENT PHYSICAL THERAPY TREATMENT    Patient Name: Savannah Wilkins MRN: [id number] DOB:January 01, 1951, 72 y.o., female Today's Date: 12/25/2022  END OF SESSION:  PT End of Session - 12/25/22 1059     Visit Number 13    Number of Visits 17    Date for PT Re-Evaluation 01/04/23    Authorization Time Period 11/06/22-01/12/23    PT Start Time 0946    PT Stop Time 1017    PT Time Calculation (min) 31 min    Activity Tolerance Patient tolerated treatment well    Behavior During Therapy [facility] for tasks assessed/performed             Past Medical History:  Diagnosis Date   Arthritis    DVT (deep venous thrombosis) (HCC) 2015   pulmonary emboli   GERD (gastroesophageal reflux disease)    occ   Headache    history of ileus 09/2020   History of kidney stones    Lichen sclerosus    Pneumonia 2015   PONV (postoperative nausea and vomiting)    Pre-diabetes    Pulmonary emboli (HCC) 2015   Restless leg syndrome    Past Surgical History:  Procedure Laterality Date   CATARACT EXTRACTION Bilateral 2013   CHOLECYSTECTOMY  1996   COLONOSCOPY  2015   CYST EXCISION Right 2010   pinkie finger   dcr Right    right eye   EXTRACORPOREAL SHOCK WAVE LITHOTRIPSY Bilateral 2006   twice on left and once on right   EXTRACORPOREAL SHOCK WAVE LITHOTRIPSY Right 07/25/2018   Procedure: EXTRACORPOREAL SHOCK WAVE LITHOTRIPSY (ESWL);  Surgeon: Sondra Come, MD;  Location: [facility];  Service: Urology;  Laterality: Right;   EYE SURGERY Bilateral 2013   cataract extractions   HYSTEROSCOPY  2015   IVC FILTER INSERTION N/A 04/06/2020   Procedure: IVC FILTER INSERTION;  Surgeon: Renford Dills, MD;  Location: [facility];  Service: Cardiovascular;  Laterality: N/A;   IVC FILTER REMOVAL N/A 10/26/2020   Procedure: IVC FILTER REMOVAL;  Surgeon: Renford Dills, MD;  Location: [facility];  Service: Cardiovascular;  Laterality: N/A;   IVC FILTER REMOVAL N/A 11/24/2020    Procedure: IVC FILTER REMOVAL;  Surgeon: Renford Dills, MD;  Location: [facility];  Service: Cardiovascular;  Laterality: N/A;   kidney stone removal Right 2006, 2013   right ureteroscopic stone removals   RETINAL TEAR REPAIR CRYOTHERAPY Bilateral    RIGHT 2007 AND 2008; LEFT 2012   SUBMANDIBULAR GLAND EXCISION Right 03/15/2021   Procedure: EXCISION SUBMANDIBULAR GLAND;  Surgeon: Bud Face, MD;  Location: [facility];  Service: ENT;  Laterality: Right;   TONSILLECTOMY  1984   TOTAL HIP ARTHROPLASTY Right 04/08/2020   Procedure: TOTAL HIP ARTHROPLASTY ANTERIOR APPROACH;  Surgeon: Kennedy Bucker, MD;  Location: [facility];  Service: Orthopedics;  Laterality: Right;   TOTAL HIP ARTHROPLASTY Left 08/12/2020   Procedure: TOTAL HIP ARTHROPLASTY ANTERIOR APPROACH;  Surgeon: Kennedy Bucker, MD;  Location: [facility];  Service: Orthopedics;  Laterality: Left;   TUBAL LIGATION  1985   Patient Active Problem List   Diagnosis Date Noted   Infectious sialoadenitis of right submandibular gland 01/25/2021   Submandibular sialolithiasis 01/25/2021   Status post total hip replacement, left 08/12/2020   S/P hip replacement 04/08/2020   Restless legs 07/02/2018   Postmenopausal bleeding 07/02/2018   Lesion of vulva 07/02/2018   Nephrolithiasis 07/02/2018   Cataract 07/02/2018   Medicare annual wellness visit, initial 11/09/2017  Headache disorder 08/26/2017   Obesity (BMI 35.0-39.9 without comorbidity) 07/09/2017   Lichen sclerosus 07/09/2017   History of pulmonary embolus (PE) 07/09/2017   Chronic daily headache 07/09/2017   DVT of leg (deep venous thrombosis) (HCC) 08/17/2013   Hypoxia 08/16/2013   Pleuritic chest pain 08/15/2013   Acute pulmonary embolism (HCC) 08/15/2013   Arthropathy of pelvic region and thigh 07/03/2012   Arthritis, hip 07/03/2012    PCP: Marisue Ivan, MD   REFERRING PROVIDER: Visgauss, Patsi Sears, MD  REFERRING DIAG: [phone] (ICD-10-CM) - Other  specified joint disorders, right shoulder  THERAPY DIAG:  Acute pain of right shoulder  Stiffness of right shoulder, not elsewhere classified  Chronic right shoulder pain  Rationale for Evaluation and Treatment: Rehabilitation  ONSET DATE: S/P excision of R shoulder mass on 10/06/2022  SUBJECTIVE:                                                                                                                                                                                                         SUBJECTIVE STATEMENT:  Pt reports she is ready for discharge.  Pt does note that she has a sinus headache, but otherwise is doing well.     PERTINENT HISTORY:   S/P excision of R shoulder mass on 10/06/2022. R shoulder feels bet ter in general because she is currently not using her arm. 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 Per MD note: Gentle ROM, no active trap or deltoid engagement until 6 weeks post op. Has not yet had PT and feels like she is behind schedule. Wants to be able to lift 20 lbs (granddaughter) with both hands as well as to be able to drive.    BP is controlled per pt.  No latex bands  PAIN:  Are you having pain? No pain unless moving her arm the wrong way   PRECAUTIONS: Gentle ROM, no active trap or deltoid engagement until 6 weeks post op  RED FLAGS: No known red flags  WEIGHT BEARING RESTRICTIONS:   FALLS:  Has patient fallen in last 6 months? No  LIVING ENVIRONMENT: Lives with:  Lives in:  Stairs:  Has following equipment at home:   OCCUPATION:   PLOF: Independent  PATIENT GOALS: Wants to be able to lift 20 lbs (granddaughter) with both hands as well as to be able to drive.   NEXT MD VISIT:   OBJECTIVE:   TODAY'S TREATMENT: DATE: 12/25/22  TherAct:  Goal assessment performed and noted below.    PATIENT EDUCATION:  Education details: there-ex, HEP,  POC Person educated: Patient Education method: Explanation, Demonstration, Tactile  cues, Verbal cues, and Handouts Education comprehension: verbalized understanding and returned demonstration   HOME EXERCISE PROGRAM: Access Code: [email or web address] URL: [email or web address] Date: 11/06/2022 Prepared by: Loralyn Freshwater & Tomasa Hose  Exercises  - Standing Isometric Shoulder External Rotation with Doorway  - 1 x daily - 7 x weekly - 3 sets - 10 reps - 5 seconds hold - Standing Isometric Shoulder Internal Rotation at Doorway  - 1 x daily - 7 x weekly - 3 sets - 10 reps - 5 seconds hold - Standing Isometric Shoulder Flexion with Doorway - Arm Bent  - 1 x daily - 7 x weekly - 3 sets - 10 reps - 5 seconds hold - Standing Isometric Shoulder Abduction with Doorway  - 1 x daily - 7 x weekly - 3 sets - 10 reps - 5 seconds hold - Standing Isometric Shoulder Extension with Doorway - Arm Bent  - 1 x daily - 7 x weekly - 3 sets - 10 reps - 5 seconds hold - Standing Bilateral Shoulder Internal Rotation AAROM with Dowel  - 1 x daily - 7 x weekly - 3 sets - 10 reps - Standing Shoulder Extension ROM with Dowel  - 1 x daily - 7 x weekly - 3 sets - 10 reps - Wall Push Up  - 1 x daily - 7 x weekly - 3 sets - 10 reps    ASSESSMENT:  CLINICAL IMPRESSION:  Pt has made consistent progress towards goals and has been able to achieve 2/3 goals with the final goal being full ROM of shoulder flexion, which pt has not been able to achieve at this time.  Pt continues to make progress and was advised to continue with her current HEP in order to improve overall ROM and strengthening in the shoulder.  Pt advised to contact the clinic with any concerns moving forward.  Pt is formally discharged at this time.     OBJECTIVE IMPAIRMENTS: decreased ROM, decreased strength, impaired flexibility, improper body mechanics, postural dysfunction, and pain.   ACTIVITY LIMITATIONS: carrying, lifting, bathing, toileting, dressing, reach over head, and hygiene/grooming  PARTICIPATION LIMITATIONS:    PERSONAL FACTORS: Age, Fitness, Past/current experiences, Time since onset of injury/illness/exacerbation, and 1-2 comorbidities: arthritis, pre-diabetes  are also affecting patient's functional outcome.   REHAB POTENTIAL: Fair    CLINICAL DECISION MAKING: Stable/uncomplicated  EVALUATION COMPLEXITY: Low   GOALS: Goals reviewed with patient? Yes  SHORT TERM GOALS: Target date: 11/24/2022  Pt will be independent with her initial HEP to improve ROM, strength, function, and ability to raise her arm and lift without pain or difficulty.  Baseline: Pt has started her initial HEP (11/06/2022) Goal status: MET   LONG TERM GOALS: Target date: 01/04/2023  Pt will improve her R shoulder flexion and abduction AROM to 140 degrees or more and ER AROM to 65 degrees or more to promote ability to raise her arm with less difficulty up when appropriate.  Baseline:  Passive ROM Right eval  Shoulder flexion 100  Shoulder abduction 90  Shoulder internal rotation 90  Shoulder external rotation 55  (11/06/2022)  Active ROM Right eval  Shoulder flexion 110  Shoulder abduction 98  Shoulder internal rotation 90  Shoulder external rotation 55  12/13/22  Active ROM Right eval  Shoulder flexion 123  Shoulder abduction 142  Shoulder internal rotation 91  Shoulder external rotation 59  12/25/22 Goal status: PARTIALLY MET   2.  Pt will  have at least 4/5 R shoulder flexion, abduction, ER, and IR to promote ability to use her R UE for functional tasks with less difficulty.  Baseline: R shoulder strength not yet tested secondary to precautions and to allow for more healing (11/06/2022)  12/13/22 MMT Right  Shoulder flexion 4+  Shoulder abduction 3+*  Shoulder internal rotation 4+  Shoulder external rotation 4+  * - denotes pain   12/25/22 MMT Right  Shoulder flexion 4+  Shoulder abduction 4*  Shoulder internal rotation 4+  Shoulder external rotation 4+  * - denotes mild pain  Goal  status: MET    3.  Pt will improve her R shoulder FOTO score by at least 10 points as a demonstration of improved function.  Baseline: R shoulder FOTO 4 (11/06/2022) 12/13/22: 55 12/25/22: 61 Goal status: MET     PLAN:  PT FREQUENCY: 1-2x/week  PT DURATION: 8 weeks  PLANNED INTERVENTIONS: 97110-Therapeutic exercises, 97530- Therapeutic activity, 97112- Neuromuscular re-education, 97140- Manual therapy, 97014- Electrical stimulation (unattended), 97033- Ionotophoresis 4mg /ml Dexamethasone, Patient/Family education, and Dry Needling  PLAN FOR NEXT SESSION:   d/c    Nolon Bussing, PT, DPT Physical Therapist - [facility]  12/25/22, 4:01 PM

## 2023-03-23 ENCOUNTER — Other Ambulatory Visit: Payer: Self-pay | Admitting: Family Medicine

## 2023-03-23 DIAGNOSIS — Z1231 Encounter for screening mammogram for malignant neoplasm of breast: Secondary | ICD-10-CM

## 2023-04-23 ENCOUNTER — Ambulatory Visit
Admission: RE | Admit: 2023-04-23 | Discharge: 2023-04-23 | Disposition: A | Discharge status: Home / Self Care | Source: Ambulatory Visit | Attending: Family Medicine | Admitting: Family Medicine

## 2023-04-23 DIAGNOSIS — Z1231 Encounter for screening mammogram for malignant neoplasm of breast: Secondary | ICD-10-CM | POA: Diagnosis present

## 2023-07-04 ENCOUNTER — Encounter: Payer: Self-pay | Admitting: Otolaryngology

## 2023-07-04 ENCOUNTER — Other Ambulatory Visit: Payer: Self-pay | Admitting: Otolaryngology

## 2023-07-04 DIAGNOSIS — K115 Sialolithiasis: Secondary | ICD-10-CM

## 2023-07-05 ENCOUNTER — Encounter: Payer: Self-pay | Admitting: Otolaryngology

## 2023-07-06 ENCOUNTER — Ambulatory Visit
Admission: RE | Admit: 2023-07-06 | Discharge: 2023-07-06 | Disposition: A | Discharge status: Home / Self Care | Source: Ambulatory Visit | Attending: Otolaryngology | Admitting: Otolaryngology

## 2023-07-06 DIAGNOSIS — K115 Sialolithiasis: Secondary | ICD-10-CM

## 2023-07-06 MED ORDER — IOPAMIDOL (ISOVUE-300) INJECTION 61%
75.0000 mL | Freq: Once | INTRAVENOUS | Status: AC | PRN
  Administered 2023-07-06: 75 mL via INTRAVENOUS
